# Patient Record
Sex: Female | Born: 1985 | Race: White | Hispanic: No | Marital: Married | Smoking: Current every day smoker
Health system: Southern US, Community
[De-identification: ages and names within clinical notes are randomized; demographics above are authoritative.]

## PROBLEM LIST (undated history)

## (undated) DIAGNOSIS — D649 Anemia, unspecified: Secondary | ICD-10-CM

## (undated) DIAGNOSIS — I10 Essential (primary) hypertension: Secondary | ICD-10-CM

## (undated) DIAGNOSIS — Z9289 Personal history of other medical treatment: Secondary | ICD-10-CM

## (undated) DIAGNOSIS — K219 Gastro-esophageal reflux disease without esophagitis: Secondary | ICD-10-CM

## (undated) DIAGNOSIS — Z789 Other specified health status: Secondary | ICD-10-CM

## (undated) HISTORY — PX: MANDIBLE FRACTURE SURGERY: SHX706

---
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
## 2008-02-04 ENCOUNTER — Emergency Department: Payer: Self-pay | Admitting: Emergency Medicine

## 2008-04-25 ENCOUNTER — Inpatient Hospital Stay: Payer: Self-pay

## 2008-06-08 ENCOUNTER — Encounter: Payer: Self-pay | Admitting: Maternal & Fetal Medicine

## 2011-05-29 ENCOUNTER — Emergency Department: Payer: Self-pay | Admitting: Internal Medicine

## 2012-08-24 ENCOUNTER — Encounter (HOSPITAL_COMMUNITY): Payer: Self-pay | Admitting: Emergency Medicine

## 2012-08-24 ENCOUNTER — Emergency Department (HOSPITAL_COMMUNITY)
Admission: EM | Admit: 2012-08-24 | Discharge: 2012-08-24 | Disposition: A | Discharge status: Home / Self Care | Payer: Self-pay | Attending: Emergency Medicine | Admitting: Emergency Medicine

## 2012-08-24 DIAGNOSIS — J040 Acute laryngitis: Secondary | ICD-10-CM | POA: Insufficient documentation

## 2012-08-24 DIAGNOSIS — H6690 Otitis media, unspecified, unspecified ear: Secondary | ICD-10-CM | POA: Insufficient documentation

## 2012-08-24 DIAGNOSIS — H6692 Otitis media, unspecified, left ear: Secondary | ICD-10-CM

## 2012-08-24 MED ORDER — HYDROCODONE-ACETAMINOPHEN 7.5-500 MG/15ML PO SOLN: 15.0000 mL | Freq: Four times a day (QID) | ORAL | Status: DC | PRN

## 2012-08-24 MED ORDER — PREDNISONE 10 MG PO TABS: 20.0000 mg | ORAL_TABLET | Freq: Every day | ORAL | Status: DC

## 2012-08-24 MED ORDER — AMOXICILLIN 500 MG PO CAPS: 500.0000 mg | ORAL_CAPSULE | Freq: Three times a day (TID) | ORAL | Status: DC

## 2012-08-24 NOTE — ED Notes (Signed)
Patient reports onset of symptoms 5 days ago.  Patient reports initial onset of cough, nausea, and vomiting that has progressed into ear pain, head pain, and congestion.  Patient denies any visual changes or any other neurological changes.  Patient endorses some dizziness when she has not eaten.  Patient reports decreases appetite and fatigue.  Patient denies any sick contacts. Patient denies any fevers at home.

## 2012-08-24 NOTE — ED Provider Notes (Signed)
History     CSN: [id number]  Arrival date & time 08/24/12  1632   First MD Initiated Contact with Patient 08/24/12 1706      Chief Complaint  Patient presents with  . URI    (Consider location/radiation/quality/duration/timing/severity/associated sxs/prior treatment) HPI  A generally healthy 26 year old female presents with URI symptoms. Patient reports for the past 5 days she has had headache, ear pain, sore throat, or sinus nonproductive cough, nausea, vomiting, body aches,and nasal congestion. Symptoms gradually onset, persistent, moderate in severity, not alleviate with DayQuil, NyQuil, and Excedrin. Complaining of decreasing hearing and left ear pain. Also endorsed decreased appetite. No recent sick contact. No fevers or chills, chest pain, shortness of breath, or abdominal pain. No rash.  History reviewed. No pertinent past medical history.  History reviewed. No pertinent past surgical history.  No family history on file.  History  Substance Use Topics  . Smoking status: Not on file  . Smokeless tobacco: Not on file  . Alcohol Use: Not on file    OB History    Grav Para Term Preterm Abortions TAB SAB Ect Mult Living                  Review of Systems  All other systems reviewed and are negative.    Allergies  Review of patient's allergies indicates no known allergies.  Home Medications   Current Outpatient Rx  Name Route Sig Dispense Refill  . ASPIRIN EC 325 MG PO TBEC Oral Take 650 mg by mouth every 6 (six) hours as needed. For pain    . EXCEDRIN PO Oral Take 2-3 tablets by mouth 2 (two) times daily as needed. For pain      BP 124/74  Pulse 97  Temp 98 F (36.7 C) (Oral)  Resp 20  SpO2 98%  LMP 08/17/2012  Physical Exam  Nursing note and vitals reviewed. Constitutional: She appears well-developed and well-nourished. No distress.       Awake, alert, nontoxic appearance  HENT:  Head: Atraumatic.  Right Ear: No drainage. Tympanic membrane is  not injected and not retracted. No middle ear effusion. No decreased hearing is noted.  Left Ear: No swelling or tenderness. No foreign bodies. Tympanic membrane is injected.  No middle ear effusion. Decreased hearing is noted.  Nose: Nose normal.  Mouth/Throat: Uvula is midline.       Bilateral tonsillar enlargement without evidence of deep tissues infection or  Exudates.  Voice is hoarse  Eyes: Conjunctivae normal are normal. Right eye exhibits no discharge. Left eye exhibits no discharge.  Neck: Neck supple.  Cardiovascular: Normal rate and regular rhythm.   Pulmonary/Chest: Effort normal. No respiratory distress. She exhibits no tenderness.  Abdominal: Soft. There is no tenderness. There is no rebound.  Musculoskeletal: She exhibits no tenderness.       ROM appears intact, no obvious focal weakness  Lymphadenopathy:    She has no cervical adenopathy.  Neurological: She is alert.       Mental status and motor strength appears intact  Skin: No rash noted.  Psychiatric: She has a normal mood and affect.    ED Course  Procedures (including critical care time)  Results for orders placed during the hospital encounter of 08/24/12  RAPID STREP SCREEN      Component Value Range   Streptococcus, Group A Screen (Direct) NEGATIVE  NEGATIVE   No results found.  1. Otitis media, Left 2. laryngitis   MDM  Patient with URI symptoms.  She does have evidence of hoarseness, and bilateral tonsils enlargement. No evidence of deep tissues infection. She is afebrile with stable normal vital signs. Strep test ordered.  6:17 PM Since pt has hoarseness, and L ear pain along with constellation of URI sxs, i will prescribe amox due to persistent sxs despite OTC meds.  I will also prescribe prednisone and lortab elixir for sore throat and laryngitis.  Otherwise pt stable to be d/c.    BP 124/74  Pulse 97  Temp 98 F (36.7 C) (Oral)  Resp 20  SpO2 98%  LMP 08/17/2012  I have reviewed nursing  notes and vital signs.  I reviewed available ER/hospitalization records thought the EMR      Fayrene Helper, [place] 08/24/12 1818

## 2012-08-24 NOTE — ED Provider Notes (Signed)
Medical screening examination/treatment/procedure(s) were performed by non-physician practitioner and as supervising physician I was immediately available for consultation/collaboration.    Gram Siedlecki L Stedman [place], MD 08/24/12 2312 

## 2012-08-29 ENCOUNTER — Encounter (HOSPITAL_COMMUNITY): Payer: Self-pay | Admitting: *Deleted

## 2012-08-29 ENCOUNTER — Emergency Department (HOSPITAL_COMMUNITY)
Admission: EM | Admit: 2012-08-29 | Discharge: 2012-08-29 | Disposition: A | Discharge status: Home / Self Care | Payer: Self-pay | Source: Home / Self Care | Attending: Emergency Medicine | Admitting: Emergency Medicine

## 2012-08-29 DIAGNOSIS — H6690 Otitis media, unspecified, unspecified ear: Secondary | ICD-10-CM

## 2012-08-29 NOTE — ED Provider Notes (Signed)
Chief Complaint  Patient presents with  . Otalgia    History of Present Illness:   Sandra Pearson is a 26 year old female who has had a one half week history of left ear pain and congestion, slight sore throat, hoarseness, nasal congestion and rhinorrhea. She denies any coughing or fever. She was seen a few days ago in the emergency room for these symptoms. She was diagnosed with laryngitis and given a prescription for steroids and an antibiotic, but told to get the antibiotic filled only if she hadn't felt better in a few days. She was told she has cerumen in the ear canal and to use peroxide and water which she has been doing. She comes in today because the ear is still bothering her with pain and congestion. She denies any drainage.  Review of Systems:  Other than noted above, the patient denies any of the following symptoms: Systemic:  No fevers, chills, sweats, weight loss or gain, fatigue, or tiredness. Eye:  No redness, pain, discharge, itching, blurred vision, or diplopia. ENT:  No headache, nasal congestion, sneezing, itching, epistaxis, ear pain, congestion, decreased hearing, ringing in ears, vertigo, or tinnitus.  No oral lesions, sore throat, pain on swallowing, or hoarseness. Neck:  No mass, tenderness or adenopathy. Lungs:  No coughing, wheezing, or shortness of breath. Skin:  No rash or itching.  PMFSH:  Past medical history, family history, social history, meds, and allergies were reviewed.  Physical Exam:   Vital signs:  BP 135/67  Pulse 76  Temp 98.2 F (36.8 C) (Oral)  Resp 18  SpO2 100%  LMP 08/17/2012 General:  Alert and oriented.  In no distress.  Skin warm and dry. Eye:  PERRL, full EOMs, lids and conjunctiva normal.   ENT:  The left ear canal is completely clear and there was no cerumen present at all, however the TM is red and bulging with loss of normal landmarks. The right ear canal shows a little bit of cerumen but the TM is normal.  Nasal mucosa not congested and  without drainage.  Mucous membranes moist, no oral lesions, normal dentition, pharynx is erythematous and swollen without exudate or ulcerations.  No cranial or facial pain to palplation. Neck:  Supple, full ROM.  No adenopathy, tenderness or mass.  Thyroid normal. Lungs:  Breath sounds clear and equal bilaterally.  No wheezes, rales or rhonchi. Heart:  Rhythm regular, without extrasystoles.  No gallops or murmers. Skin:  Clear, warm and dry.  Assessment:  The encounter diagnosis was Otitis media.  Plan:   1.  The following meds were prescribed:   New Prescriptions   No medications on file   2.  The patient was instructed in symptomatic care and handouts were given. She should go ahead and get the antibiotic prescription filled and use a decongestant along with politzerization and Afrin nasal spray. 3.  The patient was told to return if becoming worse in any way, if no better in 3 or 4 days, and given some red flag symptoms that would indicate earlier return. If this hasn't gotten better in 10 days, may need referral to an ear nose and throat specialist.     Reuben Likes, MD 08/29/12 1531

## 2012-08-29 NOTE — ED Notes (Signed)
Pt     Reports  Was  Seen  Several  Days  Ago  For  Sandra Pearson     In the  Er  And  Was  Told  l  Ear was  Clogged   She  Reportedly  Used  peroxidde  To  Clear  It  Up  But it  Did  Not  -

## 2013-09-18 ENCOUNTER — Emergency Department (HOSPITAL_COMMUNITY)
Admission: EM | Admit: 2013-09-18 | Discharge: 2013-09-18 | Disposition: A | Discharge status: Home / Self Care | Payer: Self-pay | Attending: Emergency Medicine | Admitting: Emergency Medicine

## 2013-09-18 ENCOUNTER — Encounter (HOSPITAL_COMMUNITY): Payer: Self-pay | Admitting: Emergency Medicine

## 2013-09-18 DIAGNOSIS — Z792 Long term (current) use of antibiotics: Secondary | ICD-10-CM | POA: Insufficient documentation

## 2013-09-18 DIAGNOSIS — K089 Disorder of teeth and supporting structures, unspecified: Secondary | ICD-10-CM | POA: Insufficient documentation

## 2013-09-18 DIAGNOSIS — R51 Headache: Secondary | ICD-10-CM | POA: Insufficient documentation

## 2013-09-18 DIAGNOSIS — K0889 Other specified disorders of teeth and supporting structures: Secondary | ICD-10-CM

## 2013-09-18 DIAGNOSIS — IMO0002 Reserved for concepts with insufficient information to code with codable children: Secondary | ICD-10-CM | POA: Insufficient documentation

## 2013-09-18 DIAGNOSIS — Z7982 Long term (current) use of aspirin: Secondary | ICD-10-CM | POA: Insufficient documentation

## 2013-09-18 DIAGNOSIS — Z9889 Other specified postprocedural states: Secondary | ICD-10-CM | POA: Insufficient documentation

## 2013-09-18 MED ORDER — PENICILLIN V POTASSIUM 500 MG PO TABS: 500.0000 mg | ORAL_TABLET | Freq: Three times a day (TID) | ORAL | Status: DC

## 2013-09-18 MED ORDER — HYDROCODONE-ACETAMINOPHEN 5-325 MG PO TABS: 2.0000 | ORAL_TABLET | Freq: Four times a day (QID) | ORAL | Status: DC | PRN

## 2013-09-18 MED ORDER — HYDROCODONE-ACETAMINOPHEN 5-325 MG PO TABS
2.0000 | ORAL_TABLET | Freq: Once | ORAL | Status: AC
  Administered 2013-09-18: 2 via ORAL
  Filled 2013-09-18: qty 2

## 2013-09-18 NOTE — ED Notes (Signed)
prsents with front posterior tooth pain began Thursday. Dental caries noted. Pain is worse at night.

## 2013-09-18 NOTE — ED Provider Notes (Signed)
CSN: [id number]     Arrival date & time 09/18/13  1748 History   First MD Initiated Contact with Patient 09/18/13 1756     Chief Complaint  Patient presents with  . Dental Pain   (Consider location/radiation/quality/duration/timing/severity/associated sxs/prior Treatment) HPI Comments: Patient presents emergency department with chief complaint of dental pain. She states the pain began Thursday. It is progressively worsened. She states that it also causes her to have a headache. She denies any fevers or chills. She states the pain is worse at night. She has not tried anything to alleviate her symptoms. She states that she has a Education officer, community, but has been unable to see him. Nothing makes his symptoms better or worse. She states she is trying to establish an appointment.  The history is provided by the patient. No language interpreter was used.    History reviewed. No pertinent past medical history. Past Surgical History  Procedure Laterality Date  . Mandible fracture surgery     History reviewed. No pertinent family history. History  Substance Use Topics  . Smoking status: Never Smoker   . Smokeless tobacco: Not on file  . Alcohol Use: No   OB History   Grav Para Term Preterm Abortions TAB SAB Ect Mult Living                 Review of Systems  All other systems reviewed and are negative.    Allergies  Review of patient's allergies indicates no known allergies.  Home Medications   Current Outpatient Rx  Name  Route  Sig  Dispense  Refill  . amoxicillin (AMOXIL) 500 MG capsule   Oral   Take 1 capsule (500 mg total) by mouth 3 (three) times daily.   21 capsule   0   . aspirin EC 325 MG tablet   Oral   Take 650 mg by mouth every 6 (six) hours as needed. For pain         . Aspirin-Acetaminophen-Caffeine (EXCEDRIN PO)   Oral   Take 2-3 tablets by mouth 2 (two) times daily as needed. For pain         . HYDROcodone-acetaminophen (LORTAB) 7.5-500 MG/15ML solution    Oral   Take 15 mLs by mouth every 6 (six) hours as needed for pain or cough.   120 mL   0   . HYDROcodone-acetaminophen (NORCO/VICODIN) 5-325 MG per tablet   Oral   Take 2 tablets by mouth every 6 (six) hours as needed.   13 tablet   0   . penicillin v potassium (VEETID) 500 MG tablet   Oral   Take 1 tablet (500 mg total) by mouth 3 (three) times daily.   30 tablet   0   . predniSONE (DELTASONE) 10 MG tablet   Oral   Take 2 tablets (20 mg total) by mouth daily.   15 tablet   0    BP 126/67  Pulse 81  Temp(Src) 97.7 F (36.5 C) (Oral)  Resp 20  Ht 5\' 8"  (1.727 m)  Wt 125 lb (56.7 kg)  BMI 19.01 kg/m2  SpO2 100% Physical Exam  Nursing note and vitals reviewed. Constitutional: She is oriented to person, place, and time. She appears well-developed and well-nourished.  HENT:  Head: Normocephalic and atraumatic.  Mouth/Throat:    Poor dentition throughout.  Affected tooth as diagrammed.  No signs of peritonsillar or tonsillar abscess.  No signs of gingival abscess. Oropharynx is clear and without exudates.  Uvula is midline.  Airway is intact. No signs of Ludwig's angina with palpation of oral and sublingual mucosa.   Eyes: Conjunctivae and EOM are normal.  Neck: Normal range of motion.  Cardiovascular: Normal rate.   Pulmonary/Chest: Effort normal.  Abdominal: She exhibits no distension.  Musculoskeletal: Normal range of motion.  Neurological: She is alert and oriented to person, place, and time.  Skin: Skin is dry.  Psychiatric: She has a normal mood and affect. Her behavior is normal. Judgment and thought content normal.    ED Course  Procedures (including critical care time) Labs Review Labs Reviewed - No data to display Imaging Review No results found.  EKG Interpretation   None       MDM   1. Pain, dental    Patient with toothache.  No gross abscess.  Exam unconcerning for Ludwig's angina or spread of infection.  Will treat with penicillin and  pain medicine.  Urged patient to follow-up with dentist.      Roxy Horseman, PA-C 09/18/13 1823

## 2013-09-21 NOTE — ED Provider Notes (Signed)
Medical screening examination/treatment/procedure(s) were performed by non-physician practitioner and as supervising physician I was immediately available for consultation/collaboration.  EKG Interpretation   None         Nahomy Limburg J Gurnoor Ursua, MD 09/21/13 0724 

## 2014-04-14 ENCOUNTER — Encounter (HOSPITAL_COMMUNITY): Payer: Self-pay | Admitting: Emergency Medicine

## 2014-04-14 ENCOUNTER — Emergency Department (HOSPITAL_COMMUNITY)
Admission: EM | Admit: 2014-04-14 | Discharge: 2014-04-14 | Disposition: A | Discharge status: Home / Self Care | Payer: Self-pay | Attending: Emergency Medicine | Admitting: Emergency Medicine

## 2014-04-14 DIAGNOSIS — S61209A Unspecified open wound of unspecified finger without damage to nail, initial encounter: Secondary | ICD-10-CM | POA: Insufficient documentation

## 2014-04-14 DIAGNOSIS — W268XXA Contact with other sharp object(s), not elsewhere classified, initial encounter: Secondary | ICD-10-CM | POA: Insufficient documentation

## 2014-04-14 DIAGNOSIS — F17200 Nicotine dependence, unspecified, uncomplicated: Secondary | ICD-10-CM | POA: Insufficient documentation

## 2014-04-14 DIAGNOSIS — Z23 Encounter for immunization: Secondary | ICD-10-CM | POA: Insufficient documentation

## 2014-04-14 DIAGNOSIS — Y929 Unspecified place or not applicable: Secondary | ICD-10-CM | POA: Insufficient documentation

## 2014-04-14 DIAGNOSIS — Z7982 Long term (current) use of aspirin: Secondary | ICD-10-CM | POA: Insufficient documentation

## 2014-04-14 DIAGNOSIS — IMO0002 Reserved for concepts with insufficient information to code with codable children: Secondary | ICD-10-CM | POA: Insufficient documentation

## 2014-04-14 DIAGNOSIS — Z862 Personal history of diseases of the blood and blood-forming organs and certain disorders involving the immune mechanism: Secondary | ICD-10-CM | POA: Insufficient documentation

## 2014-04-14 DIAGNOSIS — Y9389 Activity, other specified: Secondary | ICD-10-CM | POA: Insufficient documentation

## 2014-04-14 DIAGNOSIS — S61311A Laceration without foreign body of left index finger with damage to nail, initial encounter: Secondary | ICD-10-CM

## 2014-04-14 DIAGNOSIS — Z792 Long term (current) use of antibiotics: Secondary | ICD-10-CM | POA: Insufficient documentation

## 2014-04-14 HISTORY — DX: Anemia, unspecified: D64.9

## 2014-04-14 MED ORDER — TETANUS-DIPHTH-ACELL PERTUSSIS 5-2.5-18.5 LF-MCG/0.5 IM SUSP
0.5000 mL | Freq: Once | INTRAMUSCULAR | Status: AC
  Administered 2014-04-14: 0.5 mL via INTRAMUSCULAR
  Filled 2014-04-14: qty 0.5

## 2014-04-14 NOTE — ED Notes (Signed)
Family at bedside. 

## 2014-04-14 NOTE — Discharge Instructions (Signed)
Keep wound clean. Refer to attached documents for more information.

## 2014-04-14 NOTE — ED Provider Notes (Addendum)
CSN: [id number]     Arrival date & time 04/14/14  1807 History  This chart was scribed for Sandra BeckKaitlyn Szekalski, PA-C working with Laray AngerKathleen M McManus, DO by Evon Slackerrance Branch, ED Scribe. This patient was seen in room TR07C/TR07C and the patient's care was started at 6:43 PM.     Chief Complaint  Patient presents with  . Extremity Laceration   The history is provided by the patient. No language interpreter was used.  HPI Comments: Pollyann SamplesBrittany Fleming is a 28 y.o. female who presents to the Emergency Department complaining of left index finger laceration. States she was taking out the trash and something in the trash bag cut her. She states she didn't notice she was cut until a few minutes after.  She states her last tetanus shot was in 2007   Past Medical History  Diagnosis Date  . Anemia    Past Surgical History  Procedure Laterality Date  . Mandible fracture surgery     No family history on file. History  Substance Use Topics  . Smoking status: Current Every Day Smoker -- 1.00 packs/day    Types: Cigarettes  . Smokeless tobacco: Not on file  . Alcohol Use: No     Comment: rarely    OB History   Grav Para Term Preterm Abortions TAB SAB Ect Mult Living                 Review of Systems  Skin: Positive for wound.  All other systems reviewed and are negative.   Allergies  Review of patient's allergies indicates no known allergies.  Home Medications   Prior to Admission medications   Medication Sig Start Date End Date Taking? Authorizing Provider  amoxicillin (AMOXIL) 500 MG capsule Take 1 capsule (500 mg total) by mouth 3 (three) times daily. 08/24/12   Fayrene HelperBowie Tran, PA-C  aspirin EC 325 MG tablet Take 650 mg by mouth every 6 (six) hours as needed. For pain    Historical Provider, MD  Aspirin-Acetaminophen-Caffeine (EXCEDRIN PO) Take 2-3 tablets by mouth 2 (two) times daily as needed. For pain    Historical Provider, MD  HYDROcodone-acetaminophen (LORTAB) 7.5-500 MG/15ML solution  Take 15 mLs by mouth every 6 (six) hours as needed for pain or cough. 08/24/12   Fayrene HelperBowie Tran, PA-C  HYDROcodone-acetaminophen (NORCO/VICODIN) 5-325 MG per tablet Take 2 tablets by mouth every 6 (six) hours as needed. 09/18/13   Roxy Horsemanobert Browning, PA-C  penicillin v potassium (VEETID) 500 MG tablet Take 1 tablet (500 mg total) by mouth 3 (three) times daily. 09/18/13   Roxy Horsemanobert Browning, PA-C  predniSONE (DELTASONE) 10 MG tablet Take 2 tablets (20 mg total) by mouth daily. 08/24/12   Fayrene HelperBowie Tran, PA-C   Triage Vitals: BP 150/88  Pulse 98  Temp(Src) 97.9 F (36.6 C)  Wt 128 lb (58.06 kg)  SpO2 100%  Physical Exam  Nursing note and vitals reviewed. Constitutional: She is oriented to person, place, and time. She appears well-developed and well-nourished. No distress.  HENT:  Head: Normocephalic and atraumatic.  Eyes: Conjunctivae and EOM are normal.  Neck: Neck supple.  Cardiovascular: Normal rate.   Pulmonary/Chest: Effort normal. No respiratory distress.  Musculoskeletal: Normal range of motion.  Neurological: She is alert and oriented to person, place, and time.  Sensation intact of distal left index finger  Skin: Skin is warm and dry.  2.5 cm vertical laceration of index finger of left hand on volar aspect.   Psychiatric: She has a normal mood and affect.  Her behavior is normal.    ED Course  Procedures (including critical care time) DIAGNOSTIC STUDIES: Oxygen Saturation is 100% on RA, normal by my interpretation.    COORDINATION OF CARE: 6:48 PM-Discussed treatment plan which includes laceration repair with pt at bedside and pt agreed to plan.   LACERATION REPAIR PROCEDURE NOTE The patient's identification was confirmed and consent was obtained. This procedure was performed by Sandra BeckKaitlyn Szekalski, PA-C at 6:50 PM. Site: volar aspect of left index finger Anesthetic used (type and amt): none Suture type/size: dermabond Length:2.5 # of Sutures:  n/a Technique:n/a Complexity:simple Antibx ointment applied: none Tetanus UTD or ordered: tdap given Site anesthetized, irrigated with NS, explored without evidence of foreign body, wound well approximated, site covered with dry, sterile dressing.  Patient tolerated procedure well without complications. Instructions for care discussed verbally and patient provided with additional written instructions for homecare and f/u.   Labs Review Labs Reviewed - No data to display  Imaging Review No results found.   EKG Interpretation None      MDM   Final diagnoses:  Laceration of left index finger w/o foreign body with damage to nail, initial encounter    7:19 PM Patient's laceration cleaned and closed without difficulty. No neurovascular compromise. Patient will have tdap here. No further evaluation needed at this time.    I personally performed the services described in this documentation, which was scribed in my presence. The recorded information has been reviewed and is accurate.      Sandra BeckKaitlyn Szekalski, PA-C 04/14/14 [address]22  Sandra LaupahoehoeSzekalski, [place]-C 04/27/14 1112

## 2014-04-16 NOTE — ED Provider Notes (Signed)
Medical screening examination/treatment/procedure(s) were performed by non-physician practitioner and as supervising physician I was immediately available for consultation/collaboration.   EKG Interpretation None        Laray AngerKathleen M McManus, DO 04/16/14 1447

## 2014-04-27 NOTE — ED Provider Notes (Signed)
Medical screening examination/treatment/procedure(s) were performed by non-physician practitioner and as supervising physician I was immediately available for consultation/collaboration.   EKG Interpretation None        Laray AngerKathleen M McManus, DO 04/27/14 1601

## 2016-04-20 ENCOUNTER — Emergency Department (HOSPITAL_COMMUNITY)
Admission: EM | Admit: 2016-04-20 | Discharge: 2016-04-20 | Disposition: A | Discharge status: Home / Self Care | Payer: Self-pay | Attending: Emergency Medicine | Admitting: Emergency Medicine

## 2016-04-20 ENCOUNTER — Encounter (HOSPITAL_COMMUNITY): Payer: Self-pay

## 2016-04-20 DIAGNOSIS — Z7982 Long term (current) use of aspirin: Secondary | ICD-10-CM | POA: Insufficient documentation

## 2016-04-20 DIAGNOSIS — Z79899 Other long term (current) drug therapy: Secondary | ICD-10-CM | POA: Insufficient documentation

## 2016-04-20 DIAGNOSIS — F17210 Nicotine dependence, cigarettes, uncomplicated: Secondary | ICD-10-CM | POA: Insufficient documentation

## 2016-04-20 DIAGNOSIS — L089 Local infection of the skin and subcutaneous tissue, unspecified: Secondary | ICD-10-CM | POA: Insufficient documentation

## 2016-04-20 MED ORDER — SULFAMETHOXAZOLE-TRIMETHOPRIM 800-160 MG PO TABS
1.0000 | ORAL_TABLET | Freq: Once | ORAL | Status: AC
  Administered 2016-04-20: 1 via ORAL
  Filled 2016-04-20: qty 1

## 2016-04-20 MED ORDER — SULFAMETHOXAZOLE-TRIMETHOPRIM 800-160 MG PO TABS: 1.0000 | ORAL_TABLET | Freq: Two times a day (BID) | ORAL | Status: AC

## 2016-04-20 NOTE — ED Notes (Signed)
Pt states she started having breakout on her face and it progressed to feeling something crawling on her; Pt states she has worms coming out of her skin; Pt has red marks over face and swollen left eye; Pt states rash on face was worse when she woke up this morning; Pt denies pain; pt a&ox 4 on arrival.

## 2016-04-20 NOTE — ED Provider Notes (Signed)
CSN: [id number]     Arrival date & time 04/20/16  [id number] History   First MD Initiated Contact with Patient 04/20/16 [phone]     Chief Complaint  Patient presents with  . Insect Bite     (Consider location/radiation/quality/duration/timing/severity/associated sxs/prior Treatment) HPI Comments: Patient with a history of anemia on no regular medications presents with complaint of rash. She feels like bugs/worms are in her skin causing itching and small sores that are generalized but worse on her face which began scabbing, swelling under her left eye and turning red this morning. No fever. She denies drug use. She states she can see worms come from the small sores and then go back into the skin. She also reports she sees these worms under her upper eye lid of the left eye where the facial swelling is the worst. No visual changes, eye drainage.  The history is provided by the patient. No language interpreter was used.    Past Medical History  Diagnosis Date  . Anemia    Past Surgical History  Procedure Laterality Date  . Mandible fracture surgery     History reviewed. No pertinent family history. Social History  Substance Use Topics  . Smoking status: Current Every Day Smoker -- 1.00 packs/day    Types: Cigarettes  . Smokeless tobacco: None  . Alcohol Use: No     Comment: rarely    OB History    No data available     Review of Systems  Constitutional: Negative for fever and chills.  Musculoskeletal: Negative.  Negative for myalgias.  Skin:       See HPI.  Neurological: Negative.       Allergies  Review of patient's allergies indicates no known allergies.  Home Medications   Prior to Admission medications   Medication Sig Start Date End Date Taking? Authorizing Provider  aspirin-acetaminophen-caffeine (EXCEDRIN MIGRAINE) [phone] [id number] MG per tablet Take 3 tablets by mouth 4 (four) times daily as needed for migraine.    Historical Provider, MD  sulfamethoxazole-trimethoprim  (BACTRIM DS,SEPTRA DS) 800-160 MG tablet Take 1 tablet by mouth 2 (two) times daily. 04/20/16 04/27/16  Elpidio AnisShari Dani Wallner, PA-C   BP 123/73 mmHg  Pulse 91  Temp(Src) 98.1 F (36.7 C) (Oral)  Resp 18  SpO2 98%  LMP 03/31/2016 Physical Exam  Constitutional: She is oriented to person, place, and time. She appears well-developed and well-nourished.  Eyes:  Full, pain-free range of extraocular movement. No foreign body observed in conjunctiva or upper eye lid.   Neck: Normal range of motion.  Pulmonary/Chest: Effort normal.  Neurological: She is alert and oriented to person, place, and time.  Skin: Skin is warm and dry.  Multiple facial sores that are scabbed. No active drainage or bleeding. There is soft tissue swelling and erythema associated with the sores under her left eye. No periorbital involvement. There are small areas of excoriation to UE's bilaterally that are in various stages of healing.     ED Course  Procedures (including critical care time) Labs Review Labs Reviewed - No data to display  Imaging Review No results found. I have personally reviewed and evaluated these images and lab results as part of my medical decision-making.   EKG Interpretation None      MDM   Final diagnoses:  Skin infection    Patient presents with complaint of skin sores stating she has seen worms in her skin and can't get them out. She denies drug use. 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 Facial sores are fairly large with what appears to be secondary infection under left eye. Will provide septra DS for treatment.     Elpidio AnisShari Jaliana [place], PA-C 04/28/16 0002  Layla MawKristen N Ward, DO 04/28/16 [id number]

## 2016-04-20 NOTE — ED Notes (Signed)
NP at bedside.

## 2016-04-20 NOTE — Discharge Instructions (Signed)
Wound Infection °A wound infection happens when a type of germ (bacteria) starts growing in the wound. In some cases, this can cause the wound to break open. If cared for properly, the infected wound will heal from the inside to the outside. Wound infections need treatment. °CAUSES °An infection is caused by bacteria growing in the wound.  °SYMPTOMS  °· Increase in redness, swelling, or pain at the wound site. °· Increase in drainage at the wound site. °· Wound or bandage (dressing) starts to smell bad. °· Fever. °· Feeling tired or fatigued. °· Pus draining from the wound. °TREATMENT  °Your health care provider will prescribe antibiotic medicine. The wound infection should improve within 24 to 48 hours. Any redness around the wound should stop spreading and the wound should be less painful.  °HOME CARE INSTRUCTIONS  °· Only take over-the-counter or prescription medicines for pain, discomfort, or fever as directed by your health care provider. °· Take your antibiotics as directed. Finish them even if you start to feel better. °· Gently wash the area with mild soap and water 2 times a day, or as directed. Rinse off the soap. Pat the area dry with a clean towel. Do not rub the wound. This may cause bleeding. °· Follow your health care provider's instructions for how often you need to change the dressing. °· Apply ointment and a dressing to the wound as directed. °· If the dressing sticks, moisten it with soapy water and gently remove it. °· Change the bandage right away if it becomes wet, dirty, or develops a bad smell. °· Take showers. Do not take tub baths, swim, or do anything that may soak the wound until it is healed. °· Avoid exercises that make you sweat heavily. °· Use anti-itch medicine as directed by your health care provider. The wound may itch when it is healing. Do not pick or scratch at the wound. °· Follow up with your health care provider to get your wound rechecked as directed. °SEEK MEDICAL CARE  IF: °· You have an increase in swelling, pain, or redness around the wound. °· You have an increase in the amount of pus coming from the wound. °· There is a bad smell coming from the wound. °· More of the wound breaks open. °· You have a fever. °MAKE SURE YOU:  °· Understand these instructions. °· Will watch your condition. °· Will get help right away if you are not doing well or get worse. °  °This information is not intended to replace advice given to you by your health care provider. Make sure you discuss any questions you have with your health care provider. °  °Document Released: 07/12/2003 Document Revised: 10/18/2013 Document Reviewed: 04/02/2015 °Elsevier Interactive Patient Education ©2016 [employer] ° °

## 2016-08-28 ENCOUNTER — Emergency Department (HOSPITAL_BASED_OUTPATIENT_CLINIC_OR_DEPARTMENT_OTHER)
Admission: EM | Admit: 2016-08-28 | Discharge: 2016-08-28 | Discharge status: Against Medical Advice | Payer: Self-pay | Attending: Emergency Medicine | Admitting: Emergency Medicine

## 2016-08-28 ENCOUNTER — Encounter (HOSPITAL_BASED_OUTPATIENT_CLINIC_OR_DEPARTMENT_OTHER): Payer: Self-pay | Admitting: *Deleted

## 2016-08-28 DIAGNOSIS — O209 Hemorrhage in early pregnancy, unspecified: Secondary | ICD-10-CM | POA: Insufficient documentation

## 2016-08-28 DIAGNOSIS — Z3A01 Less than 8 weeks gestation of pregnancy: Secondary | ICD-10-CM | POA: Insufficient documentation

## 2016-08-28 DIAGNOSIS — Z7982 Long term (current) use of aspirin: Secondary | ICD-10-CM | POA: Insufficient documentation

## 2016-08-28 DIAGNOSIS — N939 Abnormal uterine and vaginal bleeding, unspecified: Secondary | ICD-10-CM

## 2016-08-28 DIAGNOSIS — F17210 Nicotine dependence, cigarettes, uncomplicated: Secondary | ICD-10-CM | POA: Insufficient documentation

## 2016-08-28 DIAGNOSIS — O99331 Smoking (tobacco) complicating pregnancy, first trimester: Secondary | ICD-10-CM | POA: Insufficient documentation

## 2016-08-28 LAB — CBC WITH DIFFERENTIAL/PLATELET
BASOS ABS: 0 10*3/uL (ref 0.0–0.1)
Basophils Relative: 0 %
EOS ABS: 0.1 10*3/uL (ref 0.0–0.7)
EOS PCT: 1 %
HCT: 35.4 % — ABNORMAL LOW (ref 36.0–46.0)
Hemoglobin: 11.7 g/dL — ABNORMAL LOW (ref 12.0–15.0)
Lymphocytes Relative: 26 %
Lymphs Abs: 1.8 10*3/uL (ref 0.7–4.0)
MCH: 27.7 pg (ref 26.0–34.0)
MCHC: 33.1 g/dL (ref 30.0–36.0)
MCV: 83.7 fL (ref 78.0–100.0)
Monocytes Absolute: 0.4 10*3/uL (ref 0.1–1.0)
Monocytes Relative: 5 %
NEUTROS PCT: 68 %
Neutro Abs: 4.5 10*3/uL (ref 1.7–7.7)
PLATELETS: 332 10*3/uL (ref 150–400)
RBC: 4.23 MIL/uL (ref 3.87–5.11)
RDW: 15.8 % — ABNORMAL HIGH (ref 11.5–15.5)
WBC: 6.7 10*3/uL (ref 4.0–10.5)

## 2016-08-28 LAB — URINALYSIS, ROUTINE W REFLEX MICROSCOPIC
BILIRUBIN URINE: NEGATIVE
Glucose, UA: NEGATIVE mg/dL
KETONES UR: NEGATIVE mg/dL
Nitrite: NEGATIVE
Protein, ur: NEGATIVE mg/dL
SPECIFIC GRAVITY, URINE: 1.016 (ref 1.005–1.030)
pH: 5 (ref 5.0–8.0)

## 2016-08-28 LAB — HCG, QUANTITATIVE, PREGNANCY: HCG, BETA CHAIN, QUANT, S: 1 m[IU]/mL (ref <5)

## 2016-08-28 LAB — URINE MICROSCOPIC-ADD ON

## 2016-08-28 LAB — PREGNANCY, URINE: Preg Test, Ur: NEGATIVE

## 2016-08-28 LAB — WET PREP, GENITAL
Clue Cells Wet Prep HPF POC: NONE SEEN
SPERM: NONE SEEN
Trich, Wet Prep: NONE SEEN
YEAST WET PREP: NONE SEEN

## 2016-08-28 LAB — ABO/RH: ABO/RH(D): A POS

## 2016-08-28 NOTE — ED Notes (Signed)
Dr. Ethelda ChickJacubowitz went in to discuss results with pt and pt was not in room, gown was on chair, and belongings were absent. Pt did not discuss leaving with staff, nor did staff see her depart. Pt is assumed to have left prior to discharge. No pending orders, no prescriptions.

## 2016-08-28 NOTE — ED Provider Notes (Signed)
[facility] Provider Note   CSN: [id number] Arrival date & time: 08/28/16  1802 By signing my name below, I, [name], attest that this documentation has been prepared under the direction and in the presence of Doug SouSam Miakoda Mcmillion, MD . Electronically Signed: [name], Scribe. 08/28/2016. 6:19 PM.   History   Chief Complaint Chief Complaint  Patient presents with  . Abdominal Pain  . Vaginal Bleeding  . [redacted] weeks pregnant    HPI DenmarkBrittany  is a 30 y.o. female with hx of anemia who presents to the Emergency Department complaining of gradual onset, intermittent suprapubic pain which began this morning at 2 am. She describes her pain as cramping Nonradiating rates it as a 4/10 in severity. Per pt, the pain lasts for 2 hours at a time with twenty minutes of relief. Pt notes associated vaginal bleeding. She has used 6 or 7 pads today. She is not followed by an obstetrician, but states that she knows that she conceived six weeks ago. This is her second pregnancy. She is unsure of her blood type or count. Pt denies any tobacco, drug or alcohol use. NKDA. Pt has no other complaints at this time. . Nothing makes symptoms better or worse. No other associated symptoms. No lightheadedness no fever no dysuria  The history is provided by the patient. No language interpreter was used.    Past Medical History:  Diagnosis Date  . Anemia     There are no active problems to display for this patient.   Past Surgical History:  Procedure Laterality Date  . MANDIBLE FRACTURE SURGERY      OB History    Gravida Para Term Preterm AB Living   1             SAB TAB Ectopic Multiple Live Births                 Gravida 2 para 1001 with one term delivery, which died shortly after birth  Home Medications    Prior to Admission medications   Medication Sig Start Date End Date Taking? Authorizing Provider  aspirin-acetaminophen-caffeine (EXCEDRIN MIGRAINE) [phone] MG per tablet  Take 3 tablets by mouth 4 (four) times daily as needed for migraine.    Historical Provider, MD    Family History No family history on file.  Social History Social History  Substance Use Topics  . Smoking status: Current Every Day Smoker    Packs/day: 1.00    Types: Cigarettes  . Smokeless tobacco: Never Used  . Alcohol use No     Comment: rarely      Allergies   Review of patient's allergies indicates no known allergies.   Review of Systems Review of Systems  Constitutional: Negative.   HENT: Negative.   Respiratory: Negative.   Cardiovascular: Negative.   Gastrointestinal: Positive for abdominal pain.       Suprapubic pain  Genitourinary: Positive for vaginal bleeding.       Pregnant  Musculoskeletal: Negative.   Skin: Negative.   Neurological: Negative.   Psychiatric/Behavioral: Negative.   All other systems reviewed and are negative.   Physical Exam Updated Vital Signs BP 120/90   Pulse 119 Comment: states she has been upset and crying over personal issue prior to arrival  Temp 98.5 F (36.9 C) (Oral)   Resp 20   Ht 5\' 9"  (1.753 m)   Wt 136 lb (61.7 kg)   LMP 07/09/2016   SpO2 96%   BMI 20.08 kg/m  Physical Exam  Constitutional: She appears well-developed and well-nourished. No distress.  HENT:  Head: Normocephalic and atraumatic.  Poor dentition  Eyes: Conjunctivae are normal. Pupils are equal, round, and reactive to light.  Neck: Neck supple. No tracheal deviation present. No thyromegaly present.  Cardiovascular: Normal rate and regular rhythm.   No murmur heard. Pulmonary/Chest: Effort normal and breath sounds normal.  Abdominal: Soft. Bowel sounds are normal. She exhibits no distension. There is no tenderness.  Genitourinary:  Genitourinary Comments: No external lesion. Slight amount of dark vaginal blood in vault. Cervical os closed. No cervical motion tenderness no adnexal or fundal masses or tenderness  Musculoskeletal: Normal range of  motion. She exhibits no edema or tenderness.  Neurological: She is alert. Coordination normal.  Skin: Skin is warm and dry. No rash noted.  Psychiatric: She has a normal mood and affect.  Nursing note and vitals reviewed.    ED Treatments / Results  DIAGNOSTIC STUDIES:  Oxygen Saturation is 96% on RA, adequate by my interpretation.    COORDINATION OF CARE:  6:17 PM Discussed treatment plan with pt at bedside and pt agreed to plan.  Labs (all labs ordered are listed, but only abnormal results are displayed) Labs Reviewed  PREGNANCY, URINE  URINALYSIS, ROUTINE W REFLEX MICROSCOPIC (NOT AT [facility])    EKG  EKG Interpretation None       Radiology No results found.  Procedures Procedures (including critical care time)  Medications Ordered in ED Medications - No data to display   Initial Impression / Assessment and Plan / ED Course  I have reviewed the triage vital signs and the nursing notes.  Pertinent labs & imaging results that were available during my care of the patient were reviewed by me and considered in my medical decision making (see chart for details).  Clinical Course    Patient eloped from the emergency department without notifying staff. Results for orders placed or performed during the hospital encounter of 08/28/16  Wet prep, genital  Result Value Ref Range   Yeast Wet Prep HPF POC NONE SEEN NONE SEEN   Trich, Wet Prep NONE SEEN NONE SEEN   Clue Cells Wet Prep HPF POC NONE SEEN NONE SEEN   WBC, Wet Prep HPF POC FEW (A) NONE SEEN   Sperm NONE SEEN   Pregnancy, urine  Result Value Ref Range   Preg Test, Ur NEGATIVE NEGATIVE  Urinalysis, Routine w reflex microscopic (not at [facility])  Result Value Ref Range   Color, Urine YELLOW YELLOW   APPearance CLEAR CLEAR   Specific Gravity, Urine 1.016 1.005 - 1.030   pH 5.0 5.0 - 8.0   Glucose, UA NEGATIVE NEGATIVE mg/dL   Hgb urine dipstick LARGE (A) NEGATIVE   Bilirubin Urine NEGATIVE NEGATIVE   Ketones,  ur NEGATIVE NEGATIVE mg/dL   Protein, ur NEGATIVE NEGATIVE mg/dL   Nitrite NEGATIVE NEGATIVE   Leukocytes, UA SMALL (A) NEGATIVE  hCG, quantitative, pregnancy  Result Value Ref Range   hCG, Beta Chain, Quant, S 1 <5 mIU/mL  CBC with Differential/Platelet  Result Value Ref Range   WBC 6.7 4.0 - 10.5 K/uL   RBC 4.23 3.87 - 5.11 MIL/uL   Hemoglobin 11.7 (L) 12.0 - 15.0 g/dL   HCT 16.135.4 (L) 09.636.0 - 04.546.0 %   MCV 83.7 78.0 - 100.0 fL   MCH 27.7 26.0 - 34.0 pg   MCHC 33.1 30.0 - 36.0 g/dL   RDW 40.915.8 (H) 81.111.5 - 91.415.5 %   Platelets 332  150 - 400 K/uL   Neutrophils Relative % 68 %   Neutro Abs 4.5 1.7 - 7.7 K/uL   Lymphocytes Relative 26 %   Lymphs Abs 1.8 0.7 - 4.0 K/uL   Monocytes Relative 5 %   Monocytes Absolute 0.4 0.1 - 1.0 K/uL   Eosinophils Relative 1 %   Eosinophils Absolute 0.1 0.0 - 0.7 K/uL   Basophils Relative 0 %   Basophils Absolute 0.0 0.0 - 0.1 K/uL  Urine microscopic-add on  Result Value Ref Range   Squamous Epithelial / LPF 0-5 (A) NONE SEEN   WBC, UA 0-5 0 - 5 WBC/hpf   RBC / HPF 6-30 0 - 5 RBC/hpf   Bacteria, UA FEW (A) NONE SEEN   Urine-Other MUCOUS PRESENT    No results found. No evidence of pregnancy Final Clinical Impressions(s) / ED Diagnoses  Diagnosis abnormal vaginal bleeding Final diagnoses:  None    New Prescriptions New Prescriptions   No medications on file       Doug Sou, MD 08/28/16 2332

## 2016-08-28 NOTE — ED Triage Notes (Signed)
[redacted] weeks pregnant. Vaginal bleeding and lower abdominal cramping since last night.

## 2016-08-29 LAB — GC/CHLAMYDIA PROBE AMP (~~LOC~~) NOT AT ARMC
Chlamydia: NEGATIVE
Neisseria Gonorrhea: NEGATIVE

## 2016-08-30 LAB — HIV ANTIBODY (ROUTINE TESTING W REFLEX): HIV SCREEN 4TH GENERATION: NONREACTIVE

## 2016-08-30 LAB — RPR: RPR: NONREACTIVE

## 2018-07-13 ENCOUNTER — Emergency Department: Payer: Self-pay

## 2018-07-13 ENCOUNTER — Other Ambulatory Visit: Payer: Self-pay

## 2018-07-13 ENCOUNTER — Emergency Department
Admission: EM | Admit: 2018-07-13 | Discharge: 2018-07-13 | Disposition: A | Discharge status: Home / Self Care | Payer: Self-pay | Attending: Emergency Medicine | Admitting: Emergency Medicine

## 2018-07-13 DIAGNOSIS — F17210 Nicotine dependence, cigarettes, uncomplicated: Secondary | ICD-10-CM | POA: Insufficient documentation

## 2018-07-13 DIAGNOSIS — K2900 Acute gastritis without bleeding: Secondary | ICD-10-CM | POA: Insufficient documentation

## 2018-07-13 DIAGNOSIS — N83201 Unspecified ovarian cyst, right side: Secondary | ICD-10-CM

## 2018-07-13 DIAGNOSIS — N83291 Other ovarian cyst, right side: Secondary | ICD-10-CM | POA: Insufficient documentation

## 2018-07-13 LAB — COMPREHENSIVE METABOLIC PANEL
ALBUMIN: 3.5 g/dL (ref 3.5–5.0)
ALT: 66 U/L — ABNORMAL HIGH (ref 0–44)
AST: 101 U/L — ABNORMAL HIGH (ref 15–41)
Alkaline Phosphatase: 93 U/L (ref 38–126)
Anion gap: 10 (ref 5–15)
BILIRUBIN TOTAL: 1.3 mg/dL — AB (ref 0.3–1.2)
BUN: 9 mg/dL (ref 6–20)
CO2: 23 mmol/L (ref 22–32)
Calcium: 8.9 mg/dL (ref 8.9–10.3)
Chloride: 105 mmol/L (ref 98–111)
Creatinine, Ser: 0.61 mg/dL (ref 0.44–1.00)
GFR calc Af Amer: >60 mL/min (ref >60)
GFR calc non Af Amer: >60 mL/min (ref >60)
GLUCOSE: 101 mg/dL — AB (ref 70–99)
POTASSIUM: 3.4 mmol/L — AB (ref 3.5–5.1)
SODIUM: 138 mmol/L (ref 135–145)
TOTAL PROTEIN: 6.7 g/dL (ref 6.5–8.1)

## 2018-07-13 LAB — URINALYSIS, COMPLETE (UACMP) WITH MICROSCOPIC
Bacteria, UA: NONE SEEN
Bilirubin Urine: NEGATIVE
GLUCOSE, UA: NEGATIVE mg/dL
Ketones, ur: NEGATIVE mg/dL
LEUKOCYTES UA: NEGATIVE
NITRITE: NEGATIVE
PH: 5 (ref 5.0–8.0)
Protein, ur: NEGATIVE mg/dL
SPECIFIC GRAVITY, URINE: 1.006 (ref 1.005–1.030)

## 2018-07-13 LAB — CBC
HEMATOCRIT: 36.4 % (ref 35.0–47.0)
HEMOGLOBIN: 11.8 g/dL — AB (ref 12.0–16.0)
MCH: 30.6 pg (ref 26.0–34.0)
MCHC: 32.4 g/dL (ref 32.0–36.0)
MCV: 94.5 fL (ref 80.0–100.0)
Platelets: 151 10*3/uL (ref 150–440)
RBC: 3.85 MIL/uL (ref 3.80–5.20)
RDW: 21.9 % — ABNORMAL HIGH (ref 11.5–14.5)
WBC: 5.2 10*3/uL (ref 3.6–11.0)

## 2018-07-13 LAB — POCT PREGNANCY, URINE: Preg Test, Ur: NEGATIVE

## 2018-07-13 LAB — LIPASE, BLOOD: LIPASE: 57 U/L — AB (ref 11–51)

## 2018-07-13 LAB — TSH: TSH: 1.583 u[IU]/mL (ref 0.350–4.500)

## 2018-07-13 LAB — T4, FREE: Free T4: 0.84 ng/dL (ref 0.82–1.77)

## 2018-07-13 MED ORDER — METOCLOPRAMIDE HCL 10 MG PO TABS: 10.0000 mg | ORAL_TABLET | Freq: Four times a day (QID) | ORAL | 0 refills | Status: DC | PRN

## 2018-07-13 MED ORDER — FAMOTIDINE IN NACL 20-0.9 MG/50ML-% IV SOLN
20.0000 mg | Freq: Once | INTRAVENOUS | Status: AC
  Administered 2018-07-13: 20 mg via INTRAVENOUS

## 2018-07-13 MED ORDER — FAMOTIDINE IN NACL 20-0.9 MG/50ML-% IV SOLN
INTRAVENOUS | Status: AC
  Filled 2018-07-13: qty 50

## 2018-07-13 MED ORDER — IOPAMIDOL (ISOVUE-300) INJECTION 61%
100.0000 mL | Freq: Once | INTRAVENOUS | Status: AC | PRN
  Administered 2018-07-13: 100 mL via INTRAVENOUS
  Filled 2018-07-13: qty 100

## 2018-07-13 MED ORDER — ALUMINUM-MAGNESIUM-SIMETHICONE 200-200-20 MG/5ML PO SUSP: 30.0000 mL | Freq: Three times a day (TID) | ORAL | 0 refills | Status: DC

## 2018-07-13 MED ORDER — FAMOTIDINE 20 MG PO TABS: 20.0000 mg | ORAL_TABLET | Freq: Two times a day (BID) | ORAL | 0 refills | Status: DC

## 2018-07-13 MED ORDER — SODIUM CHLORIDE 0.9 % IV BOLUS
1000.0000 mL | Freq: Once | INTRAVENOUS | Status: AC
  Administered 2018-07-13: 1000 mL via INTRAVENOUS

## 2018-07-13 MED ORDER — METOCLOPRAMIDE HCL 5 MG/ML IJ SOLN
10.0000 mg | Freq: Once | INTRAMUSCULAR | Status: AC
  Administered 2018-07-13: 10 mg via INTRAVENOUS
  Filled 2018-07-13: qty 2

## 2018-07-13 NOTE — ED Triage Notes (Addendum)
Pt reports loss of appetite and emesis with eating x 2 weeks. Tremors. Pt hx of anemia, reports irregular period for past month  Pt drank alcohol daily until approx 1 week ago.   Pt alert and oriented X4, active, cooperative, pt in NAD. RR even and unlabored, color WNL.

## 2018-07-13 NOTE — ED Provider Notes (Signed)
[facility] Emergency Department Provider Note  ____________________________________________  Time seen: Approximately 5:33 PM  I have reviewed the triage vital signs and the nursing notes.   HISTORY  Chief Complaint Emesis and Anorexia    HPI Sandra Pearson is a 32 y.o. female with no significant past medical history who complains of decreased appetite and nausea for the past 2 weeks.  She reports that some days she is only able to eat a few crackers.  No vomiting but sometimes she has dry heaves.  No constipation or diarrhea.  No fevers or chills.  Upper abdominal pain is nonradiating, worse with oral intake, no alleviating factors, crampy.  Denies past abdominal surgeries.  No trauma.      Past Medical History:  Diagnosis Date  . Anemia      There are no active problems to display for this patient.    Past Surgical History:  Procedure Laterality Date  . MANDIBLE FRACTURE SURGERY       Prior to Admission medications   Medication Sig Start Date End Date Taking? Authorizing Provider  aluminum-magnesium hydroxide-simethicone (MAALOX) 200-200-20 MG/5ML SUSP Take 30 mLs by mouth 4 (four) times daily -  before meals and at bedtime. 07/13/18   Sharman CheekStafford, Raeanne Deschler, MD  aspirin-acetaminophen-caffeine (EXCEDRIN MIGRAINE) [phone] MG per tablet Take 3 tablets by mouth 4 (four) times daily as needed for migraine.    [provider]  famotidine (PEPCID) 20 MG tablet Take 1 tablet (20 mg total) by mouth 2 (two) times daily. 07/13/18   Sharman CheekStafford, Alane Hanssen, MD  metoCLOPramide (REGLAN) 10 MG tablet Take 1 tablet (10 mg total) by mouth every 6 (six) hours as needed. 07/13/18   Sharman CheekStafford, Coraleigh Sheeran, MD     Allergies Patient has no known allergies.   No family history on file.  Social History Social History   Tobacco Use  . Smoking status: Current Every Day Smoker    Packs/day: 1.00    Types: Cigarettes  . Smokeless tobacco: Never Used  Substance Use  Topics  . Alcohol use: No    Comment: rarely   . Drug use: No    Review of Systems  Constitutional:   No fever or chills.  ENT:   No sore throat. No rhinorrhea. Cardiovascular:   No chest pain or syncope. Respiratory:   No dyspnea or cough. Gastrointestinal: Positive as above for abdominal pain without vomiting or constipation Musculoskeletal:   Negative for focal pain or swelling All other systems reviewed and are negative except as documented above in ROS and HPI.  ____________________________________________   PHYSICAL EXAM:  VITAL SIGNS: ED Triage Vitals  Enc Vitals Group     BP 07/13/18 1153 (!) 133/92     Pulse Rate 07/13/18 1153 86     Resp 07/13/18 1153 20     Temp 07/13/18 1153 98.4 F (36.9 C)     Temp Source 07/13/18 1153 Oral     SpO2 07/13/18 1153 99 %     Weight 07/13/18 1154 135 lb (61.2 kg)     Height 07/13/18 1154 5\' 9"  (1.753 m)     Head Circumference --      Peak Flow --      Pain Score 07/13/18 1154 4     Pain Loc --      Pain Edu? --      Excl. in GC? --     Vital signs reviewed, nursing assessments reviewed.   Constitutional:   Alert and oriented. Non-toxic appearance. Eyes:  Conjunctivae are normal. EOMI. PERRL. ENT      Head:   Normocephalic and atraumatic.      Nose:   No congestion/rhinnorhea.       Mouth/Throat:   MMM, no pharyngeal erythema. No peritonsillar mass.       Neck:   No meningismus. Full ROM. Hematological/Lymphatic/Immunilogical:   No cervical lymphadenopathy. Cardiovascular:   RRR. Symmetric bilateral radial and DP pulses.  No murmurs. Cap refill less than 2 seconds. Respiratory:   Normal respiratory effort without tachypnea/retractions. Breath sounds are clear and equal bilaterally. No wheezes/rales/rhonchi. Gastrointestinal:   Soft with epigastric and left upper quadrant tenderness . Non distended. There is no CVA tenderness.  No rebound, rigidity, or guarding. Musculoskeletal:   Normal range of motion in all  extremities. No joint effusions.  No lower extremity tenderness.  No edema. Neurologic:   Normal speech and language.  Motor grossly intact. No acute focal neurologic deficits are appreciated.  Skin:    Skin is warm, dry and intact. No rash noted.  No petechiae, purpura, or bullae.  ____________________________________________    LABS (pertinent positives/negatives) (all labs ordered are listed, but only abnormal results are displayed) Labs Reviewed  LIPASE, BLOOD - Abnormal; Notable for the following components:      Result Value   Lipase 57 (*)    All other components within normal limits  COMPREHENSIVE METABOLIC PANEL - Abnormal; Notable for the following components:   Potassium 3.4 (*)    Glucose, Bld 101 (*)    AST 101 (*)    ALT 66 (*)    Total Bilirubin 1.3 (*)    All other components within normal limits  CBC - Abnormal; Notable for the following components:   Hemoglobin 11.8 (*)    RDW 21.9 (*)    All other components within normal limits  URINALYSIS, COMPLETE (UACMP) WITH MICROSCOPIC - Abnormal; Notable for the following components:   Color, Urine YELLOW (*)    APPearance CLEAR (*)    Hgb urine dipstick MODERATE (*)    All other components within normal limits  TSH  T4, FREE  POC URINE PREG, ED  POCT PREGNANCY, URINE   ____________________________________________   EKG    ____________________________________________    RADIOLOGY  Ct Abdomen Pelvis W Contrast  Result Date: 07/13/2018 CLINICAL DATA:  Generalized abdominal pain and distention. EXAM: CT ABDOMEN AND PELVIS WITH CONTRAST TECHNIQUE: Multidetector CT imaging of the abdomen and pelvis was performed using the standard protocol following bolus administration of intravenous contrast. CONTRAST:  ISOVUE-300 IOPAMIDOL (ISOVUE-300) INJECTION 61% COMPARISON:  None. FINDINGS: Lower chest: No acute abnormality. Hepatobiliary: Fatty infiltration of the liver is noted. No gallstones are noted. No  biliary dilatation is noted. Pancreas: Unremarkable. No pancreatic ductal dilatation or surrounding inflammatory changes. Spleen: Normal in size without focal abnormality. Adrenals/Urinary Tract: Adrenal glands are unremarkable. Kidneys are normal, without renal calculi, focal lesion, or hydronephrosis. Bladder is unremarkable. Stomach/Bowel: Stomach is within normal limits. Appendix appears normal. No evidence of bowel wall thickening, distention, or inflammatory changes. Vascular/Lymphatic: No significant vascular findings are present. No enlarged abdominal or pelvic lymph nodes. Reproductive: Uterus is unremarkable. 4.7 cm complex right adnexal low density is noted. Pelvic ultrasound is recommended for further evaluation. Other: No abdominal wall hernia or abnormality. No abdominopelvic ascites. Musculoskeletal: No acute or significant osseous findings. IMPRESSION: Fatty infiltration of the liver. 4.7 cm complex right ovarian low density is noted. Pelvic ultrasound is recommended for further evaluation. Electronically Signed   By: Fayrene Fearing  Christen Butter, M.D.   On: 07/13/2018 15:54    ____________________________________________   PROCEDURES Procedures  ____________________________________________  DIFFERENTIAL DIAGNOSIS   Pancreatitis, gastritis, choledocholithiasis, colitis, peptic ulcer disease, tumor  CLINICAL IMPRESSION / ASSESSMENT AND PLAN / ED COURSE  Pertinent labs & imaging results that were available during my care of the patient were reviewed by me and considered in my medical decision making (see chart for details).    Patient presents with upper abdominal pain and difficulty eating for the past 2 weeks.  No pronounced focal symptoms, presentation is clinically vague.  Check labs including thyroid studies, CT scan of the abdomen and pelvis.  IV fluids for hydration and antacids.  Clinical Course as of Jul 13 1732  Tue Jul 13, 2018  1554 Labs show mild elevation of LFTs. Otherwise  normal. Thyroid studies normal.   [PS]  1729 Patient feels better.  Feels hungry and wants to eat.  CT abdomen unremarkable except for ovarian cyst which can be followed up outpatient.   [PS]    Clinical Course User Index [PS] Sharman Cheek, MD     ____________________________________________   FINAL CLINICAL IMPRESSION(S) / ED DIAGNOSES    Final diagnoses:  Acute gastritis without hemorrhage, unspecified gastritis type  Right ovarian cyst     ED Discharge Orders         Ordered    aluminum-magnesium hydroxide-simethicone (MAALOX) 200-200-20 MG/5ML SUSP  3 times daily before meals & bedtime,   Status:  Discontinued     07/13/18 1731    famotidine (PEPCID) 20 MG tablet  2 times daily,   Status:  Discontinued     07/13/18 1731    metoCLOPramide (REGLAN) 10 MG tablet  Every 6 hours PRN,   Status:  Discontinued     07/13/18 1731    aluminum-magnesium hydroxide-simethicone (MAALOX) 200-200-20 MG/5ML SUSP  3 times daily before meals & bedtime     07/13/18 1733    famotidine (PEPCID) 20 MG tablet  2 times daily     07/13/18 1733    metoCLOPramide (REGLAN) 10 MG tablet  Every 6 hours PRN     07/13/18 1733          Portions of this note were generated with dragon dictation software. Dictation errors may occur despite best attempts at proofreading.    Sharman Cheek, MD 07/13/18 [phone]

## 2018-07-13 NOTE — ED Notes (Signed)
Patient with multiple problems today.  Says she is here for inability to eat for at least 2 weeks.  Says she has been in bed a lot.  Says she has pain across mid abd.  abd is firm to palpation.  No tender.   She also says she has irregular periods for about a year. And she says she is worried about an abscess in left lower tooth as it feels swollen and uncomfortable since yesterday.  Patient is alert and in no distress, but she is tremulous all over.

## 2018-07-13 NOTE — Discharge Instructions (Signed)
Your labs and CT scan today did not show any serious problems with your abdomen to explain your symptoms.  This is likely due to stomach inflammation.  The CT scan did show a 4 cm cyst on your right ovary.  You should follow-up with gynecology for further evaluation of this finding.

## 2018-10-03 ENCOUNTER — Emergency Department
Admission: EM | Admit: 2018-10-03 | Discharge: 2018-10-03 | Disposition: A | Discharge status: Home / Self Care | Payer: Self-pay | Attending: Emergency Medicine | Admitting: Emergency Medicine

## 2018-10-03 ENCOUNTER — Other Ambulatory Visit: Payer: Self-pay

## 2018-10-03 ENCOUNTER — Emergency Department: Payer: Self-pay

## 2018-10-03 DIAGNOSIS — R112 Nausea with vomiting, unspecified: Secondary | ICD-10-CM | POA: Insufficient documentation

## 2018-10-03 DIAGNOSIS — K7010 Alcoholic hepatitis without ascites: Secondary | ICD-10-CM | POA: Insufficient documentation

## 2018-10-03 DIAGNOSIS — E872 Acidosis: Secondary | ICD-10-CM | POA: Insufficient documentation

## 2018-10-03 DIAGNOSIS — F1020 Alcohol dependence, uncomplicated: Secondary | ICD-10-CM | POA: Insufficient documentation

## 2018-10-03 DIAGNOSIS — E8729 Other acidosis: Secondary | ICD-10-CM

## 2018-10-03 DIAGNOSIS — K209 Esophagitis, unspecified without bleeding: Secondary | ICD-10-CM

## 2018-10-03 DIAGNOSIS — F17210 Nicotine dependence, cigarettes, uncomplicated: Secondary | ICD-10-CM | POA: Insufficient documentation

## 2018-10-03 DIAGNOSIS — I10 Essential (primary) hypertension: Secondary | ICD-10-CM | POA: Insufficient documentation

## 2018-10-03 DIAGNOSIS — R079 Chest pain, unspecified: Secondary | ICD-10-CM | POA: Insufficient documentation

## 2018-10-03 HISTORY — DX: Essential (primary) hypertension: I10

## 2018-10-03 LAB — URINALYSIS, COMPLETE (UACMP) WITH MICROSCOPIC
Glucose, UA: 50 mg/dL — AB
Hgb urine dipstick: NEGATIVE
Ketones, ur: 5 mg/dL — AB
LEUKOCYTES UA: NEGATIVE
NITRITE: NEGATIVE
PH: 5 (ref 5.0–8.0)
Protein, ur: 100 mg/dL — AB
Specific Gravity, Urine: 1.032 — ABNORMAL HIGH (ref 1.005–1.030)

## 2018-10-03 LAB — COMPREHENSIVE METABOLIC PANEL
ALT: 170 U/L — ABNORMAL HIGH (ref 0–44)
ALT: 123 U/L — AB (ref 0–44)
ANION GAP: 22 — AB (ref 5–15)
AST: 206 U/L — ABNORMAL HIGH (ref 15–41)
AST: 136 U/L — ABNORMAL HIGH (ref 15–41)
Albumin: 4.3 g/dL (ref 3.5–5.0)
Albumin: 3.2 g/dL — ABNORMAL LOW (ref 3.5–5.0)
Alkaline Phosphatase: 159 U/L — ABNORMAL HIGH (ref 38–126)
Alkaline Phosphatase: 114 U/L (ref 38–126)
Anion gap: 11 (ref 5–15)
BUN: 8 mg/dL (ref 6–20)
BUN: 7 mg/dL (ref 6–20)
CALCIUM: 8.1 mg/dL — AB (ref 8.9–10.3)
CHLORIDE: 98 mmol/L (ref 98–111)
CHLORIDE: 100 mmol/L (ref 98–111)
CO2: 21 mmol/L — ABNORMAL LOW (ref 22–32)
CO2: 24 mmol/L (ref 22–32)
CREATININE: 0.82 mg/dL (ref 0.44–1.00)
Calcium: 9.8 mg/dL (ref 8.9–10.3)
Creatinine, Ser: 0.87 mg/dL (ref 0.44–1.00)
Glucose, Bld: 109 mg/dL — ABNORMAL HIGH (ref 70–99)
Glucose, Bld: 275 mg/dL — ABNORMAL HIGH (ref 70–99)
POTASSIUM: 4.2 mmol/L (ref 3.5–5.1)
Potassium: 4 mmol/L (ref 3.5–5.1)
SODIUM: 135 mmol/L (ref 135–145)
Sodium: 141 mmol/L (ref 135–145)
TOTAL PROTEIN: 8 g/dL (ref 6.5–8.1)
Total Bilirubin: 3.3 mg/dL — ABNORMAL HIGH (ref 0.3–1.2)
Total Bilirubin: 2.8 mg/dL — ABNORMAL HIGH (ref 0.3–1.2)
Total Protein: 6 g/dL — ABNORMAL LOW (ref 6.5–8.1)

## 2018-10-03 LAB — LIPASE, BLOOD: Lipase: 29 U/L (ref 11–51)

## 2018-10-03 LAB — CBC
HCT: 43.2 % (ref 36.0–46.0)
HEMOGLOBIN: 13.6 g/dL (ref 12.0–15.0)
MCH: 29.8 pg (ref 26.0–34.0)
MCHC: 31.5 g/dL (ref 30.0–36.0)
MCV: 94.7 fL (ref 80.0–100.0)
NRBC: 0 % (ref 0.0–0.2)
Platelets: 285 10*3/uL (ref 150–400)
RBC: 4.56 MIL/uL (ref 3.87–5.11)
RDW: 22.6 % — AB (ref 11.5–15.5)
WBC: 7.6 10*3/uL (ref 4.0–10.5)

## 2018-10-03 LAB — TROPONIN I

## 2018-10-03 LAB — POCT PREGNANCY, URINE: Preg Test, Ur: NEGATIVE

## 2018-10-03 MED ORDER — ONDANSETRON HCL 4 MG/2ML IJ SOLN
4.0000 mg | Freq: Once | INTRAMUSCULAR | Status: AC
  Administered 2018-10-03: 4 mg via INTRAVENOUS
  Filled 2018-10-03: qty 2

## 2018-10-03 MED ORDER — FAMOTIDINE 20 MG PO TABS: 20.0000 mg | ORAL_TABLET | Freq: Two times a day (BID) | ORAL | 0 refills | Status: DC

## 2018-10-03 MED ORDER — DEXTROSE 5 % AND 0.45 % NACL IV BOLUS
1000.0000 mL | Freq: Once | INTRAVENOUS | Status: AC
  Administered 2018-10-03: 1000 mL via INTRAVENOUS
  Filled 2018-10-03: qty 1000

## 2018-10-03 MED ORDER — SODIUM CHLORIDE 0.9 % IV BOLUS
1000.0000 mL | Freq: Once | INTRAVENOUS | Status: AC
  Administered 2018-10-03: 1000 mL via INTRAVENOUS

## 2018-10-03 MED ORDER — ONDANSETRON HCL 4 MG/2ML IJ SOLN
4.0000 mg | Freq: Once | INTRAMUSCULAR | Status: AC | PRN
  Administered 2018-10-03: 4 mg via INTRAVENOUS
  Filled 2018-10-03: qty 2

## 2018-10-03 MED ORDER — METOCLOPRAMIDE HCL 10 MG PO TABS: 10.0000 mg | ORAL_TABLET | Freq: Four times a day (QID) | ORAL | 0 refills | Status: DC | PRN

## 2018-10-03 MED ORDER — ALUM & MAG HYDROXIDE-SIMETH 200-200-20 MG/5ML PO SUSP
30.0000 mL | Freq: Once | ORAL | Status: AC
  Administered 2018-10-03: 30 mL via ORAL
  Filled 2018-10-03: qty 30

## 2018-10-03 NOTE — ED Notes (Signed)
Patient transported to Ultrasound 

## 2018-10-03 NOTE — ED Provider Notes (Signed)
[facility] Emergency Department Provider Note  ____________________________________________   First MD Initiated Contact with Patient 10/03/18 1538     (approximate)  I have reviewed the triage vital signs and the nursing notes.   HISTORY  Chief Complaint Emesis and Chest Pain   HPI Sandra Pearson is a 32 y.o. female with a history of anemia, hypertension and alcoholism was presented to the emergency department with intractable nausea and vomiting over the past 3 days.  The patient says the episode began 3 days ago when she was driving and felt sharp chest pain to the left side of her chest.  She says that she then had numbness and tingling to her bilateral upper extremities but that resolved.  However, she reports continued nausea and vomiting, approximately every 2 hours since then.  She denies any diarrhea.  Says that initial sharp chest pain has dissipated but now she has a burning type of pain to the center of her chest.  Does not report any radiation.  Says that up until 2 days ago she had been drinking approximately 3 vodka-containing drinks per day.  Denies any drug use.  Says that she has had similar episodes in the past and received medication for gastritis and reflux which helped her symptoms. Denies any abdominal pain.  Denies blood in the vomitus.    Past Medical History:  Diagnosis Date  . Anemia   . Hypertension     There are no active problems to display for this patient.   Past Surgical History:  Procedure Laterality Date  . MANDIBLE FRACTURE SURGERY      Prior to Admission medications   Not on File    Allergies Patient has no known allergies.  History reviewed. No pertinent family history.  Social History Social History   Tobacco Use  . Smoking status: Current Every Day Smoker    Packs/day: 1.00    Types: Cigarettes  . Smokeless tobacco: Never Used  Substance Use Topics  . Alcohol use: No    Comment: rarely   . Drug  use: No    Review of Systems  Constitutional: No fever/chills Eyes: No visual changes. ENT: No sore throat. Cardiovascular: as above Respiratory: Denies shortness of breath. Gastrointestinal: No abdominal pain.    No diarrhea.  No constipation. Genitourinary: Negative for dysuria. Musculoskeletal: Negative for back pain. Skin: Negative for rash. Neurological: Negative for headaches, focal weakness or numbness.   ____________________________________________   PHYSICAL EXAM:  VITAL SIGNS: ED Triage Vitals  Enc Vitals Group     BP 10/03/18 1313 130/74     Pulse Rate 10/03/18 1313 (!) 151     Resp 10/03/18 1313 20     Temp 10/03/18 1313 98 F (36.7 C)     Temp Source 10/03/18 1313 Oral     SpO2 10/03/18 1313 99 %     Weight 10/03/18 1314 136 lb (61.7 kg)     Height 10/03/18 1314 5\' 9"  (1.753 m)     Head Circumference --      Peak Flow --      Pain Score 10/03/18 1313 6     Pain Loc --      Pain Edu? --      Excl. in GC? --     Constitutional: Alert and oriented. Well appearing and in no acute distress. Eyes: Conjunctivae are pale.  Head: Atraumatic. Nose: No congestion/rhinnorhea. Mouth/Throat: Mucous membranes are moist.  No erythema to the posterior pharynx.  No exudate.  Neck: No stridor.   Cardiovascular: Tachycardic, regular rhythm. Grossly normal heart sounds.   Respiratory: Normal respiratory effort.  No retractions. Lungs CTAB. Gastrointestinal: Soft and nontender. No distention.  Musculoskeletal: No lower extremity tenderness nor edema.  No joint effusions. Neurologic:  Normal speech and language. No gross focal neurologic deficits are appreciated. Skin:  Skin is warm, dry and intact. No rash noted. Psychiatric: Mood and affect are normal. Speech and behavior are normal.  ____________________________________________   LABS (all labs ordered are listed, but only abnormal results are displayed)  Labs Reviewed  COMPREHENSIVE METABOLIC PANEL - Abnormal;  Notable for the following components:      Result Value   CO2 21 (*)    Glucose, Bld 109 (*)    AST 206 (*)    ALT 170 (*)    Alkaline Phosphatase 159 (*)    Total Bilirubin 3.3 (*)    Anion gap 22 (*)    All other components within normal limits  CBC - Abnormal; Notable for the following components:   RDW 22.6 (*)    All other components within normal limits  URINALYSIS, COMPLETE (UACMP) WITH MICROSCOPIC - Abnormal; Notable for the following components:   Color, Urine AMBER (*)    APPearance CLEAR (*)    Specific Gravity, Urine 1.032 (*)    Glucose, UA 50 (*)    Bilirubin Urine SMALL (*)    Ketones, ur 5 (*)    Protein, ur 100 (*)    Bacteria, UA RARE (*)    All other components within normal limits  COMPREHENSIVE METABOLIC PANEL - Abnormal; Notable for the following components:   Glucose, Bld 275 (*)    Calcium 8.1 (*)    Total Protein 6.0 (*)    Albumin 3.2 (*)    AST 136 (*)    ALT 123 (*)    Total Bilirubin 2.8 (*)    All other components within normal limits  LIPASE, BLOOD  TROPONIN I  POCT PREGNANCY, URINE  POC URINE PREG, ED   ____________________________________________  EKG  ED ECG REPORT I, Arelia Longest, the attending physician, personally viewed and interpreted this ECG.   Date: 10/03/2018  EKG Time: 1316  Rate: 149  Rhythm: sinus tachycardia  Axis: normal  Intervals:none  ST&T Change: No ST segment elevation or depression.  No abnormal T wave.  ____________________________________________  RADIOLOGY  Right upper quadrant ultrasound suspected of hepatic steatosis.  4 mm gallbladder polyp versus adherent gallstone.  However, follow-up not required given small size of finding. ____________________________________________   PROCEDURES  Procedure(s) performed:   Procedures  Critical Care performed:   ____________________________________________   INITIAL IMPRESSION / ASSESSMENT AND PLAN / ED COURSE  Pertinent labs & imaging results  that were available during my care of the patient were reviewed by me and considered in my medical decision making (see chart for details).  Differential diagnosis includes, but is not limited to, ACS, aortic dissection, pulmonary embolism, cardiac tamponade, pneumothorax, pneumonia, pericarditis, myocarditis, GI-related causes including esophagitis/gastritis, and musculoskeletal chest wall pain, alcoholic ketoacidosis, choledocolithiasis, alcoholic hepatitis, cirrhosis  ----------------------------------------- 6:49 PM on 10/03/2018 -----------------------------------------  Patient at this time with a closed anion gap.  Decrease transaminases as well as bilirubin but still elevated.  Patient feeling improved at this time.  Still no complaints of pain.  Able to tolerate p.o. fluids and says the burning sensation in her chest is gone away after taking Maalox.  Likely alcoholic ketoacidosis.  Elevated glucose now but likely because  patient received dextrose.  Patient will be discharged with prescription for Pepcid as well as Reglan which she was discharged with last time.  The patient says that these medications helped her symptoms greatly.  I also discussed with the patient the importance of having follow-up with gastroenterology.  She is understanding of the plan and willing to comply.  She is also aware that her issue today is likely caused by drinking.  We did discussed reducing drinking and how this will likely prevent further issues.  She is understanding and willing to comply. ____________________________________________   FINAL CLINICAL IMPRESSION(S) / ED DIAGNOSES  Alcoholic ketoacidosis.  Nausea and vomiting.  Esophagitis.  NEW MEDICATIONS STARTED DURING THIS VISIT:  New Prescriptions   No medications on file     Note:  This document was prepared using Dragon voice recognition software and may include unintentional dictation errors.     Myrna Blazer, MD 10/03/18  [phone]

## 2018-10-03 NOTE — ED Notes (Signed)
ED Provider at bedside. 

## 2018-10-03 NOTE — ED Triage Notes (Signed)
Pt states vomiting began yesterday. Denies diarrhea. States sore throat from vomiting. C/o of L sided chest pain. Vomiting in triage. Appears shaking and fatigued.

## 2018-10-04 ENCOUNTER — Telehealth: Payer: Self-pay | Admitting: Gastroenterology

## 2018-10-04 NOTE — Telephone Encounter (Signed)
Unable to leave vm to contact pt for ed f/u with Dr. Allegra LaiVanga

## 2018-11-22 ENCOUNTER — Ambulatory Visit: Payer: Self-pay | Admitting: Gastroenterology

## 2018-11-22 ENCOUNTER — Other Ambulatory Visit: Payer: Self-pay

## 2018-11-22 ENCOUNTER — Encounter: Payer: Self-pay | Admitting: Gastroenterology

## 2018-11-22 ENCOUNTER — Inpatient Hospital Stay
Admission: EM | Admit: 2018-11-22 | Discharge: 2018-11-26 | DRG: 439 | Disposition: A | Discharge status: Home / Self Care | Payer: Self-pay | Attending: Internal Medicine | Admitting: Internal Medicine

## 2018-11-22 DIAGNOSIS — Z23 Encounter for immunization: Secondary | ICD-10-CM

## 2018-11-22 DIAGNOSIS — K852 Alcohol induced acute pancreatitis without necrosis or infection: Principal | ICD-10-CM | POA: Diagnosis present

## 2018-11-22 DIAGNOSIS — R112 Nausea with vomiting, unspecified: Secondary | ICD-10-CM | POA: Diagnosis present

## 2018-11-22 DIAGNOSIS — E860 Dehydration: Secondary | ICD-10-CM | POA: Diagnosis present

## 2018-11-22 DIAGNOSIS — N83299 Other ovarian cyst, unspecified side: Secondary | ICD-10-CM | POA: Insufficient documentation

## 2018-11-22 DIAGNOSIS — K2990 Gastroduodenitis, unspecified, without bleeding: Secondary | ICD-10-CM | POA: Diagnosis present

## 2018-11-22 DIAGNOSIS — I10 Essential (primary) hypertension: Secondary | ICD-10-CM | POA: Diagnosis present

## 2018-11-22 DIAGNOSIS — K2900 Acute gastritis without bleeding: Secondary | ICD-10-CM | POA: Diagnosis present

## 2018-11-22 DIAGNOSIS — Z8249 Family history of ischemic heart disease and other diseases of the circulatory system: Secondary | ICD-10-CM

## 2018-11-22 DIAGNOSIS — K7010 Alcoholic hepatitis without ascites: Secondary | ICD-10-CM | POA: Diagnosis present

## 2018-11-22 DIAGNOSIS — E872 Acidosis: Secondary | ICD-10-CM | POA: Diagnosis present

## 2018-11-22 DIAGNOSIS — D500 Iron deficiency anemia secondary to blood loss (chronic): Secondary | ICD-10-CM | POA: Diagnosis present

## 2018-11-22 DIAGNOSIS — Z7141 Alcohol abuse counseling and surveillance of alcoholic: Secondary | ICD-10-CM

## 2018-11-22 DIAGNOSIS — N179 Acute kidney failure, unspecified: Secondary | ICD-10-CM | POA: Diagnosis present

## 2018-11-22 DIAGNOSIS — F17210 Nicotine dependence, cigarettes, uncomplicated: Secondary | ICD-10-CM | POA: Diagnosis present

## 2018-11-22 DIAGNOSIS — K7460 Unspecified cirrhosis of liver: Secondary | ICD-10-CM | POA: Diagnosis present

## 2018-11-22 DIAGNOSIS — K2970 Gastritis, unspecified, without bleeding: Secondary | ICD-10-CM

## 2018-11-22 DIAGNOSIS — N920 Excessive and frequent menstruation with regular cycle: Secondary | ICD-10-CM | POA: Diagnosis present

## 2018-11-22 DIAGNOSIS — N83201 Unspecified ovarian cyst, right side: Secondary | ICD-10-CM | POA: Diagnosis present

## 2018-11-22 LAB — URINALYSIS, COMPLETE (UACMP) WITH MICROSCOPIC
Bacteria, UA: NONE SEEN
Bilirubin Urine: NEGATIVE
Glucose, UA: NEGATIVE mg/dL
Ketones, ur: 20 mg/dL — AB
Leukocytes, UA: NEGATIVE
Nitrite: NEGATIVE
Protein, ur: 30 mg/dL — AB
Specific Gravity, Urine: 1.015 (ref 1.005–1.030)
pH: 5 (ref 5.0–8.0)

## 2018-11-22 LAB — COMPREHENSIVE METABOLIC PANEL
ALK PHOS: 153 U/L — AB (ref 38–126)
ALT: 115 U/L — ABNORMAL HIGH (ref 0–44)
AST: 161 U/L — ABNORMAL HIGH (ref 15–41)
Albumin: 4.3 g/dL (ref 3.5–5.0)
Anion gap: 32 — ABNORMAL HIGH (ref 5–15)
BILIRUBIN TOTAL: 4.7 mg/dL — AB (ref 0.3–1.2)
BUN: 13 mg/dL (ref 6–20)
CO2: 11 mmol/L — ABNORMAL LOW (ref 22–32)
CREATININE: 1.21 mg/dL — AB (ref 0.44–1.00)
Calcium: 10.4 mg/dL — ABNORMAL HIGH (ref 8.9–10.3)
Chloride: 96 mmol/L — ABNORMAL LOW (ref 98–111)
GFR calc Af Amer: >60 mL/min (ref >60)
GFR calc non Af Amer: 60 mL/min — ABNORMAL LOW (ref >60)
Glucose, Bld: 116 mg/dL — ABNORMAL HIGH (ref 70–99)
Potassium: 4.9 mmol/L (ref 3.5–5.1)
Sodium: 139 mmol/L (ref 135–145)
TOTAL PROTEIN: 8.7 g/dL — AB (ref 6.5–8.1)

## 2018-11-22 LAB — POCT PREGNANCY, URINE: Preg Test, Ur: NEGATIVE

## 2018-11-22 LAB — CBC
HCT: 48.3 % — ABNORMAL HIGH (ref 36.0–46.0)
Hemoglobin: 14.4 g/dL (ref 12.0–15.0)
MCH: 30.8 pg (ref 26.0–34.0)
MCHC: 29.8 g/dL — ABNORMAL LOW (ref 30.0–36.0)
MCV: 103.2 fL — ABNORMAL HIGH (ref 80.0–100.0)
PLATELETS: 292 10*3/uL (ref 150–400)
RBC: 4.68 MIL/uL (ref 3.87–5.11)
RDW: 18.6 % — ABNORMAL HIGH (ref 11.5–15.5)
WBC: 20.7 10*3/uL — ABNORMAL HIGH (ref 4.0–10.5)
nRBC: 0 % (ref 0.0–0.2)

## 2018-11-22 LAB — LIPASE, BLOOD: Lipase: 19 U/L (ref 11–51)

## 2018-11-22 MED ORDER — PANTOPRAZOLE SODIUM 40 MG IV SOLR
40.0000 mg | Freq: Two times a day (BID) | INTRAVENOUS | Status: DC
  Administered 2018-11-22 – 2018-11-26 (×7): 40 mg via INTRAVENOUS
  Filled 2018-11-22 (×8): qty 40

## 2018-11-22 MED ORDER — ONDANSETRON HCL 4 MG/2ML IJ SOLN
4.0000 mg | Freq: Once | INTRAMUSCULAR | Status: AC
  Administered 2018-11-22: 4 mg via INTRAVENOUS
  Filled 2018-11-22: qty 2

## 2018-11-22 MED ORDER — INFLUENZA VAC SPLIT QUAD 0.5 ML IM SUSY
0.5000 mL | PREFILLED_SYRINGE | INTRAMUSCULAR | Status: AC
  Administered 2018-11-24: 0.5 mL via INTRAMUSCULAR
  Filled 2018-11-22: qty 0.5

## 2018-11-22 MED ORDER — SODIUM CHLORIDE 0.9 % IV BOLUS
1000.0000 mL | Freq: Once | INTRAVENOUS | Status: AC
  Administered 2018-11-22: 1000 mL via INTRAVENOUS

## 2018-11-22 MED ORDER — FAMOTIDINE IN NACL 20-0.9 MG/50ML-% IV SOLN
20.0000 mg | Freq: Once | INTRAVENOUS | Status: AC
  Administered 2018-11-22: 20 mg via INTRAVENOUS
  Filled 2018-11-22: qty 50

## 2018-11-22 MED ORDER — FAMOTIDINE 20 MG PO TABS
20.0000 mg | ORAL_TABLET | Freq: Two times a day (BID) | ORAL | Status: DC
  Administered 2018-11-22: 20 mg via ORAL
  Filled 2018-11-22: qty 1

## 2018-11-22 MED ORDER — ONDANSETRON HCL 4 MG/2ML IJ SOLN
4.0000 mg | Freq: Four times a day (QID) | INTRAMUSCULAR | Status: DC | PRN
  Administered 2018-11-22 – 2018-11-25 (×6): 4 mg via INTRAVENOUS
  Filled 2018-11-22 (×6): qty 2

## 2018-11-22 MED ORDER — LIDOCAINE VISCOUS HCL 2 % MT SOLN
15.0000 mL | Freq: Once | OROMUCOSAL | Status: AC
  Administered 2018-11-22: 15 mL via OROMUCOSAL
  Filled 2018-11-22: qty 15

## 2018-11-22 MED ORDER — MENTHOL 3 MG MT LOZG
1.0000 | LOZENGE | OROMUCOSAL | Status: DC | PRN
  Filled 2018-11-22 (×2): qty 9

## 2018-11-22 MED ORDER — PNEUMOCOCCAL VAC POLYVALENT 25 MCG/0.5ML IJ INJ
0.5000 mL | INJECTION | INTRAMUSCULAR | Status: AC
  Administered 2018-11-24: 0.5 mL via INTRAMUSCULAR
  Filled 2018-11-22: qty 0.5

## 2018-11-22 MED ORDER — ONDANSETRON HCL 4 MG PO TABS: 4.0000 mg | ORAL_TABLET | Freq: Four times a day (QID) | ORAL | Status: DC | PRN

## 2018-11-22 MED ORDER — ACETAMINOPHEN 325 MG PO TABS: 650.0000 mg | ORAL_TABLET | Freq: Four times a day (QID) | ORAL | Status: DC | PRN

## 2018-11-22 MED ORDER — ENOXAPARIN SODIUM 40 MG/0.4ML ~~LOC~~ SOLN
40.0000 mg | SUBCUTANEOUS | Status: DC
  Administered 2018-11-22 – 2018-11-23 (×2): 40 mg via SUBCUTANEOUS
  Filled 2018-11-22 (×3): qty 0.4

## 2018-11-22 MED ORDER — ACETAMINOPHEN 650 MG RE SUPP: 650.0000 mg | Freq: Four times a day (QID) | RECTAL | Status: DC | PRN

## 2018-11-22 MED ORDER — SODIUM CHLORIDE 0.45 % IV SOLN
INTRAVENOUS | Status: DC
  Administered 2018-11-22 – 2018-11-23 (×2): via INTRAVENOUS
  Filled 2018-11-22 (×4): qty 100

## 2018-11-22 NOTE — ED Provider Notes (Signed)
[facility] Emergency Department Provider Note ____________________________________________   First MD Initiated Contact with Patient 11/22/18 [phone]     (approximate)  I have reviewed the triage vital signs and the nursing notes.   HISTORY  Chief Complaint Emesis    HPI Sandra SamplesBrittany Lewisburg is a 33 y.o. female with PMH as noted below as well as a history of gastritis presents with vomiting over the last 2 days, associated with nausea, but not associated with abdominal pain or diarrhea.  She states it is similar to prior episodes of gastritis that she has had.  The patient reports one episode of emesis tinged with blood 2 days ago, but has not had any further blood appear in the vomitus.  She has no blood in her stool.  She denies fever.  The patient reports a history of alcohol use but has not been drinking heavily recently.  Past Medical History:  Diagnosis Date  . Anemia   . Hypertension     Patient Active Problem List   Diagnosis Date Noted  . Complex ovarian cyst 11/22/2018  . Intractable vomiting with nausea 11/22/2018    Past Surgical History:  Procedure Laterality Date  . MANDIBLE FRACTURE SURGERY      Prior to Admission medications   Medication Sig Start Date End Date Taking? Authorizing Provider  famotidine (PEPCID) 20 MG tablet Take 1 tablet (20 mg total) by mouth 2 (two) times daily. 10/03/18 10/03/19 Yes Schaevitz, Myra Rudeavid Matthew, MD  metoCLOPramide (REGLAN) 10 MG tablet Take 1 tablet (10 mg total) by mouth every 6 (six) hours as needed. Patient not taking: Reported on 11/22/2018 10/03/18   Schaevitz, Myra Rudeavid Matthew, MD    Allergies Patient has no known allergies.  Family History  Problem Relation Age of Onset  . Hepatitis C Mother   . Heart disease Mother     Social History Social History   Tobacco Use  . Smoking status: Current Every Day Smoker    Packs/day: 1.00    Types: Cigarettes  . Smokeless tobacco: Never Used  Substance  Use Topics  . Alcohol use: No    Comment: rarely   . Drug use: No    Review of Systems  Constitutional: No fever. Eyes: No redness. ENT: No neck pain. Cardiovascular: Positive for chest pain. Respiratory: Denies shortness of breath. Gastrointestinal: Positive for vomiting.  Genitourinary: Negative for flank pain.  Musculoskeletal: Negative for back pain. Skin: Negative for rash. Neurological: Negative for headache.   ____________________________________________   PHYSICAL EXAM:  VITAL SIGNS: ED Triage Vitals  Enc Vitals Group     BP 11/22/18 0641 112/82     Pulse Rate 11/22/18 0643 100     Resp 11/22/18 0641 16     Temp 11/22/18 0643 (!) 96.7 F (35.9 C)     Temp Source 11/22/18 0641 Oral     SpO2 11/22/18 0641 100 %     Weight 11/22/18 0640 134 lb (60.8 kg)     Height 11/22/18 0640 5\' 9"  (1.753 m)     Head Circumference --      Peak Flow --      Pain Score --      Pain Loc --      Pain Edu? --      Excl. in GC? --     Constitutional: Alert and oriented.  Uncomfortable appearing but in no acute distress. Eyes: Conjunctivae are normal.  No scleral icterus. Head: Atraumatic. Nose: No congestion/rhinnorhea. Mouth/Throat: Mucous membranes are dry.  Neck: Normal range of motion.  Cardiovascular: Normal rate, regular rhythm. Grossly normal heart sounds.  Good peripheral circulation. Respiratory: Normal respiratory effort.  No retractions. Lungs CTAB. Gastrointestinal: Soft and nontender. No distention.  Genitourinary: No flank tenderness. Musculoskeletal: No lower extremity edema.  Extremities warm and well perfused.  Neurologic:  Normal speech and language. No gross focal neurologic deficits are appreciated.  Skin:  Skin is warm and dry. No rash noted. Psychiatric: Mood and affect are normal. Speech and behavior are normal.  ____________________________________________   LABS (all labs ordered are listed, but only abnormal results are displayed)  Labs  Reviewed  COMPREHENSIVE METABOLIC PANEL - Abnormal; Notable for the following components:      Result Value   Chloride 96 (*)    CO2 11 (*)    Glucose, Bld 116 (*)    Creatinine, Ser 1.21 (*)    Calcium 10.4 (*)    Total Protein 8.7 (*)    AST 161 (*)    ALT 115 (*)    Alkaline Phosphatase 153 (*)    Total Bilirubin 4.7 (*)    GFR calc non Af Amer 60 (*)    Anion gap 32 (*)    All other components within normal limits  CBC - Abnormal; Notable for the following components:   WBC 20.7 (*)    HCT 48.3 (*)    MCV 103.2 (*)    MCHC 29.8 (*)    RDW 18.6 (*)    All other components within normal limits  URINALYSIS, COMPLETE (UACMP) WITH MICROSCOPIC - Abnormal; Notable for the following components:   Color, Urine YELLOW (*)    APPearance CLEAR (*)    Hgb urine dipstick SMALL (*)    Ketones, ur 20 (*)    Protein, ur 30 (*)    All other components within normal limits  COMPREHENSIVE METABOLIC PANEL - Abnormal; Notable for the following components:   Glucose, Bld 115 (*)    Calcium 8.1 (*)    Total Protein 5.4 (*)    Albumin 3.1 (*)    AST 61 (*)    ALT 60 (*)    Total Bilirubin 2.2 (*)    All other components within normal limits  LIPASE, BLOOD  POC URINE PREG, ED  POCT PREGNANCY, URINE   ____________________________________________  EKG   ____________________________________________  RADIOLOGY    ____________________________________________   PROCEDURES  Procedure(s) performed: No  Procedures  Critical Care performed: No ____________________________________________   INITIAL IMPRESSION / ASSESSMENT AND PLAN / ED COURSE  Pertinent labs & imaging results that were available during my care of the patient were reviewed by me and considered in my medical decision making (see chart for details).  33 year old female with PMH as noted above presents with nausea and vomiting over the last 2 days with no abdominal pain or fever.  She states it is similar to prior  episodes that she has had with gastritis.  I reviewed the past medical records in Epic; the patient has been seen several times for similar symptoms, most recently in December of last year.  The patient states she actually has an appointment with a gastroenterologist for this afternoon.  On exam she is uncomfortable appearing with normal vital signs, and her abdomen is soft and nontender.  The remainder of the exam is as described above.  Overall I suspect most likely gastritis or PUD, likely alcohol-related.  Differential would also include pancreatitis, other hepatobiliary cause, or gastroenteritis.  We will give fluids and  medication for symptomatic treatment, lab work-up, and reassess.  ----------------------------------------- 1:00 PM on 11/22/2018 -----------------------------------------  Lab work-up showed evidence of dehydration with significant anion gap.  The patient reported improvement in her symptoms and appeared more comfortable, however she progressively became more tachycardic despite getting fluids.  Given the lab abnormalities and persistent tachycardia as well as after further discussion with the patient we proceeded with admission.  I signed the patient out to the hospitalist Dr. Allena KatzPatel. ____________________________________________   FINAL CLINICAL IMPRESSION(S) / ED DIAGNOSES  Final diagnoses:  Gastritis and gastroduodenitis      NEW MEDICATIONS STARTED DURING THIS VISIT:  Current Discharge Medication List       Note:  This document was prepared using Dragon voice recognition software and may include unintentional dictation errors.    Dionne BucySiadecki, Fatma Rutten, MD 11/23/18 [phone]

## 2018-11-22 NOTE — ED Notes (Signed)
ED TO INPATIENT HANDOFF REPORT  Name/Age/Gender [place] 33 y.o. female  Code Status   Home/SNF/Other Home  Chief Complaint Vomiting  Level of Care/Admitting Diagnosis ED Disposition    ED Disposition Condition Comment   Admit  [facility] [[id number]]  Level of Care: Med-Surg [16]  Diagnosis: Intractable vomiting with nausea [[id number]][[id number]]  Admitting Physician: Joselyn GlassmanPATEL, SONA [[id number]]  Attending Physician: Joselyn GlassmanPATEL, SONA [[id number]]  Estimated length of stay: past midnight tomorrow  Certification:: I certify this patient will need inpatient services for at least 2 midnights  PT Class (Do Not Modify): Inpatient [101]  PT Acc Code (Do Not Modify): Private [1]       Medical History Past Medical History:  Diagnosis Date  . Anemia   . Hypertension     Allergies No Known Allergies  IV Location/Drains/Wounds Patient Lines/Drains/Airways Status   Active Line/Drains/Airways    Name:   Placement date:   Placement time:   Site:   Days:   Peripheral IV 07/13/18 Left Antecubital   07/13/18    1528    Antecubital   132   Peripheral IV 11/22/18 Left Antecubital   11/22/18    0743    Antecubital   less than 1          Labs/Imaging Results for orders placed or performed during the hospital encounter of 11/22/18 (from the past 48 hour(s))  Comprehensive metabolic panel     Status: Abnormal   Collection Time: 11/22/18  7:44 AM  Result Value Ref Range   Sodium 139 135 - 145 mmol/L    Comment: LYTES REPEATED DAS   Potassium 4.9 3.5 - 5.1 mmol/L   Chloride 96 (L) 98 - 111 mmol/L   CO2 11 (L) 22 - 32 mmol/L   Glucose, Bld 116 (H) 70 - 99 mg/dL   BUN 13 6 - 20 mg/dL   Creatinine, Ser 4.541.21 (H) 0.44 - 1.00 mg/dL   Calcium 09.810.4 (H) 8.9 - 10.3 mg/dL   Total Protein 8.7 (H) 6.5 - 8.1 g/dL   Albumin 4.3 3.5 - 5.0 g/dL   AST 119161 (H) 15 - 41 U/L    Comment: RESULT CONFIRMED BY MANUAL DILUTION DAS   ALT 115 (H) 0 - 44 U/L    Comment: RESULT CONFIRMED BY  MANUAL DILUTION DAS   Alkaline Phosphatase 153 (H) 38 - 126 U/L   Total Bilirubin 4.7 (H) 0.3 - 1.2 mg/dL   GFR calc non Af Amer 60 (L) >60 mL/min   GFR calc Af Amer >60 >60 mL/min   Anion gap 32 (H) 5 - 15    Comment: Performed at [facility], [address] [place], [place] [address]  CBC     Status: Abnormal   Collection Time: 11/22/18  7:44 AM  Result Value Ref Range   WBC 20.7 (H) 4.0 - 10.5 K/uL   RBC 4.68 3.87 - 5.11 MIL/uL   Hemoglobin 14.4 12.0 - 15.0 g/dL   HCT 95.648.3 (H) 21.336.0 - 08.646.0 %   MCV 103.2 (H) 80.0 - 100.0 fL   MCH 30.8 26.0 - 34.0 pg   MCHC 29.8 (L) 30.0 - 36.0 g/dL   RDW 57.818.6 (H) 46.911.5 - 62.915.5 %   Platelets 292 150 - 400 K/uL   nRBC 0.0 0.0 - 0.2 %    Comment: Performed at [facility], [address] [place], [place] [address]  Lipase, blood     Status: None   Collection Time: 11/22/18  7:44 AM  Result Value Ref Range   Lipase 19 11 - 51 U/L    Comment: Performed at [facility], [address] [place], [place] [address]  Urinalysis, Complete w Microscopic     Status: Abnormal   Collection Time: 11/22/18  7:46 AM  Result Value Ref Range   Color, Urine YELLOW (A) YELLOW   APPearance CLEAR (A) CLEAR   Specific Gravity, Urine 1.015 1.005 - 1.030   pH 5.0 5.0 - 8.0   Glucose, UA NEGATIVE NEGATIVE mg/dL   Hgb urine dipstick SMALL (A) NEGATIVE   Bilirubin Urine NEGATIVE NEGATIVE   Ketones, ur 20 (A) NEGATIVE mg/dL   Protein, ur 30 (A) NEGATIVE mg/dL   Nitrite NEGATIVE NEGATIVE   Leukocytes, UA NEGATIVE NEGATIVE   RBC / HPF 0-5 0 - 5 RBC/hpf   WBC, UA 0-5 0 - 5 WBC/hpf   Bacteria, UA NONE SEEN NONE SEEN   Squamous Epithelial / LPF 0-5 0 - 5   Mucus PRESENT    Hyaline Casts, UA PRESENT     Comment: Performed at [facility], [address] [place], [place] [address]  Pregnancy, urine POC     Status: None   Collection Time: 11/22/18  9:20 AM  Result Value Ref Range   Preg Test, Ur NEGATIVE NEGATIVE    Comment:         THE SENSITIVITY OF THIS METHODOLOGY IS >24 mIU/mL    No results found.  Pending Labs Unresulted Labs (From admission, onward)    Start     Ordered   11/22/18 1700  Basic metabolic panel  ONCE - STAT,   STAT    Comments:  Call me with results    11/22/18 1204   Signed and Held  CBC  (enoxaparin (LOVENOX)    CrCl >/= 30 ml/min)  Once,   R    Comments:  Baseline for enoxaparin therapy IF NOT ALREADY DRAWN.  Notify MD if PLT < 100 K.    Signed and Held   Signed and Held  Creatinine, serum  (enoxaparin (LOVENOX)    CrCl >/= 30 ml/min)  Once,   R    Comments:  Baseline for enoxaparin therapy IF NOT ALREADY DRAWN.    Signed and Held   Signed and Held  Creatinine, serum  (enoxaparin (LOVENOX)    CrCl >/= 30 ml/min)  Weekly,   R    Comments:  while on enoxaparin therapy    Signed and Held   Signed and Held  Comprehensive metabolic panel  Tomorrow morning,   R     Signed and Held          Vitals/Pain Today's Vitals   11/22/18 1119 11/22/18 1121 11/22/18 1145 11/22/18 1245  BP: 115/69  112/71 115/68  Pulse: (!) 125  (!) 125 (!) 127  Resp: 16  15 (!) 22  Temp:      TempSrc:      SpO2: 100%  100% 100%  Weight:      Height:      PainSc:  0-No pain      Isolation Precautions No active isolations  Medications Medications  sodium bicarbonate 100 mEq in sodium chloride 0.45 % 1,000 mL infusion (has no administration in time range)  famotidine (PEPCID) tablet 20 mg (has no administration in time range)  sodium chloride 0.9 % bolus 1,000 mL (0 mLs Intravenous Stopped 11/22/18 0901)  ondansetron (ZOFRAN) injection 4 mg (4 mg Intravenous Given 11/22/18 0743)  famotidine (PEPCID) IVPB 20 mg premix (0 mg Intravenous Stopped 11/22/18 0901)  lidocaine (XYLOCAINE) 2 % viscous mouth solution 15 mL (15 mLs Mouth/Throat Given 11/22/18 0809)  sodium chloride 0.9 % bolus 1,000 mL (0 mLs Intravenous Stopped 11/22/18 1024)    Mobility walks

## 2018-11-22 NOTE — Progress Notes (Signed)
   11/22/18 1600  Clinical Encounter Type  Visited With Patient  Visit Type Initial   Chaplain received an OR for prayer. Upon arrival to the patient's room, patient was sitting upright in the bed. She was warm and welcoming, but stated that she was not feeling well enough to talk. Chaplain will return later or pass this request on to [facility] for follow-up.

## 2018-11-22 NOTE — H&P (Signed)
[facility] - [place] at [facility]   PATIENT NAME: Sandra Pearson [place]    MR#:  [id number]  DATE OF BIRTH:  May 25, 1986  DATE OF ADMISSION:  11/22/2018  PRIMARY CARE PHYSICIAN: Patient, No Pcp Per   REQUESTING/REFERRING PHYSICIAN: Dr Marisa SeverinSiadecki  CHIEF COMPLAINT:  intractable nausea vomiting for 2 to 3 days  HISTORY OF PRESENT ILLNESS:  Sandra Pearson [place]  is a 33 y.o. female with a known history of alcohol abuse, gastritis, chronic anemia comes to the emergency room with history of intractable nausea and vomiting unable to keep anything orally for last couple days. Patient has history of alcohol abuse she reports drinking a glass of wine about a week ago. Patient has history of gastritis and was supposed to see G[facility]. at [facility] today. She was found to be severely acidotic with anion gap of 32. Patient is being admitted for further evaluation of management.  PAST MEDICAL HISTORY:   Past Medical History:  Diagnosis Date  . Anemia   . Hypertension     PAST SURGICAL HISTOIRY:   Past Surgical History:  Procedure Laterality Date  . MANDIBLE FRACTURE SURGERY      SOCIAL HISTORY:   Social History   Tobacco Use  . Smoking status: Current Every Day Smoker    Packs/day: 1.00    Types: Cigarettes  . Smokeless tobacco: Never Used  Substance Use Topics  . Alcohol use: No    Comment: rarely     FAMILY HISTORY:   Family History  Problem Relation Age of Onset  . Hepatitis C Mother   . Heart disease Mother     DRUG ALLERGIES:  No Known Allergies  REVIEW OF SYSTEMS:  Review of Systems  Constitutional: Negative for chills, fever and weight loss.  HENT: Negative for ear discharge, ear pain and nosebleeds.   Eyes: Negative for blurred vision, pain and discharge.  Respiratory: Negative for sputum production, shortness of breath, wheezing and stridor.   Cardiovascular: Negative for chest pain, palpitations, orthopnea and PND.  Gastrointestinal:  Positive for nausea and vomiting. Negative for abdominal pain and diarrhea.  Genitourinary: Negative for frequency and urgency.  Musculoskeletal: Negative for back pain and joint pain.  Neurological: Positive for weakness. Negative for sensory change, speech change and focal weakness.  Psychiatric/Behavioral: Negative for depression and hallucinations. The patient is not nervous/anxious.      MEDICATIONS AT HOME:   Prior to Admission medications   Medication Sig Start Date End Date Taking? Authorizing Provider  famotidine (PEPCID) 20 MG tablet Take 1 tablet (20 mg total) by mouth 2 (two) times daily. 10/03/18 10/03/19 Yes Schaevitz, Myra Rudeavid Matthew, MD  metoCLOPramide (REGLAN) 10 MG tablet Take 1 tablet (10 mg total) by mouth every 6 (six) hours as needed. Patient not taking: Reported on 11/22/2018 10/03/18   Myrna BlazerSchaevitz, David Matthew, MD      VITAL SIGNS:  Blood pressure 112/71, pulse (!) 125, temperature (!) 96.7 F (35.9 C), temperature source Axillary, resp. rate 15, height 5\' 9"  (1.753 m), weight 60.8 kg, last menstrual period 11/19/2018, SpO2 100 %, unknown if currently breastfeeding.  PHYSICAL EXAMINATION:  GENERAL:  33 y.o.-year-old patient lying in the bed with no acute distress. Thin cachectic frail EYES: Pupils equal, round, reactive to light and accommodation. positive scleral icterus. Extraocular muscles intact.  HEENT: Head atraumatic, normocephalic. Oropharynx and nasopharynx clear. Oral mucosa dry NECK:  Supple, no jugular venous distention. No thyroid enlargement, no tenderness.  LUNGS: Normal breath sounds bilaterally, no wheezing, rales,rhonchi or  crepitation. No use of accessory muscles of respiration.  CARDIOVASCULAR: S1, S2 normal. No murmurs, rubs, or gallops.  ABDOMEN: Soft, nontender, nondistended. Bowel sounds present. No organomegaly or mass.  EXTREMITIES: No pedal edema, cyanosis, or clubbing.  NEUROLOGIC: Cranial nerves II through XII are intact. Muscle strength  5/5 in all extremities. Sensation intact. Gait not checked.  PSYCHIATRIC: The patient is alert and oriented x 3.  SKIN: No obvious rash, lesion, or ulcer.   LABORATORY PANEL:   CBC Recent Labs  Lab 11/22/18 0744  WBC 20.7*  HGB 14.4  HCT 48.3*  PLT 292   ------------------------------------------------------------------------------------------------------------------  Chemistries  Recent Labs  Lab 11/22/18 0744  NA 139  K 4.9  CL 96*  CO2 11*  GLUCOSE 116*  BUN 13  CREATININE 1.21*  CALCIUM 10.4*  AST 161*  ALT 115*  ALKPHOS 153*  BILITOT 4.7*   ------------------------------------------------------------------------------------------------------------------  Cardiac Enzymes No results for input(s): TROPONINI in the last 168 hours. ------------------------------------------------------------------------------------------------------------------  RADIOLOGY:  No results found.  EKG:    IMPRESSION AND PLAN:   Laiah Lian  is a 33 y.o. female with a known history of alcohol abuse, gastritis, chronic anemia comes to the emergency room with history of intractable nausea and vomiting unable to keep anything orally for last couple days. Patient has history of alcohol abuse she reports drinking a glass of wine about a week ago.  1. Intractable nausea vomiting with severe alcoholic ketoacidosis -to medical floor -received 2 L of normal saline--- will continue IV fluids with bicarb and severe acidosis -PRN nausea meds -clear liquid diet advance as tolerated  2. Acute on chronic gastritis alcohol-related -continue Pepcid AC BID -patient will follow G.I. as outpatient as per schedule. No evidence of G.I. bleed  3. Elevated LFTs/bilirubin with abnormal ultrasound abdomen suggestive alcohol induced hepatitis -strongly advised on abstinence from alcohol  4. Leukocytosis appears reactive from number one -continue to monitor  5. Acute renal failure with acidosis  secondary to G.I. losses -avoid nephrotoxic's, monitor eyes and nose and aggressive IV hydration  6. DVT prophylaxis subcu Lovenox  All the records are reviewed and case discussed with ED provider.   CODE STATUS: full  TOTAL TIME TAKING CARE OF THIS PATIENT: *50* minutes.    Enedina Finner M.D on 11/22/2018 at 12:36 PM  Between 7am to 6pm - Pager - [phone]  After 6pm go to [email or web address] - password [email or web address] [facility]  [facility]  Office  [phone]  CC: Primary care physician; Patient, No Pcp Per

## 2018-11-22 NOTE — Progress Notes (Signed)
   11/22/18 1800  Clinical Encounter Type  Visited With Patient and family together  Visit Type Follow-up  Ch attempted a visit. Pt was asleep. The unit Ch will follow-up the next day.

## 2018-11-22 NOTE — ED Triage Notes (Signed)
Pt states vomiting that began two days ago. Pt denies known fever, denies abd pain and diarrhea. Pt appears in no acute distress.

## 2018-11-23 ENCOUNTER — Encounter: Payer: Self-pay | Admitting: Radiology

## 2018-11-23 ENCOUNTER — Inpatient Hospital Stay: Payer: Self-pay

## 2018-11-23 LAB — COMPREHENSIVE METABOLIC PANEL
ALT: 60 U/L — ABNORMAL HIGH (ref 0–44)
AST: 61 U/L — AB (ref 15–41)
Albumin: 3.1 g/dL — ABNORMAL LOW (ref 3.5–5.0)
Alkaline Phosphatase: 72 U/L (ref 38–126)
Anion gap: 8 (ref 5–15)
BILIRUBIN TOTAL: 2.2 mg/dL — AB (ref 0.3–1.2)
BUN: 13 mg/dL (ref 6–20)
CO2: 22 mmol/L (ref 22–32)
CREATININE: 0.74 mg/dL (ref 0.44–1.00)
Calcium: 8.1 mg/dL — ABNORMAL LOW (ref 8.9–10.3)
Chloride: 106 mmol/L (ref 98–111)
GFR calc Af Amer: >60 mL/min (ref >60)
GFR calc non Af Amer: >60 mL/min (ref >60)
Glucose, Bld: 115 mg/dL — ABNORMAL HIGH (ref 70–99)
Potassium: 3.7 mmol/L (ref 3.5–5.1)
Sodium: 136 mmol/L (ref 135–145)
Total Protein: 5.4 g/dL — ABNORMAL LOW (ref 6.5–8.1)

## 2018-11-23 LAB — LIPASE, BLOOD: Lipase: 472 U/L — ABNORMAL HIGH (ref 11–51)

## 2018-11-23 MED ORDER — OXYCODONE HCL 5 MG PO TABS
5.0000 mg | ORAL_TABLET | Freq: Four times a day (QID) | ORAL | Status: DC | PRN
  Administered 2018-11-23 – 2018-11-24 (×2): 5 mg via ORAL
  Filled 2018-11-23 (×2): qty 1

## 2018-11-23 MED ORDER — IOHEXOL 350 MG/ML SOLN
75.0000 mL | Freq: Once | INTRAVENOUS | Status: AC | PRN
  Administered 2018-11-23: 75 mL via INTRAVENOUS

## 2018-11-23 MED ORDER — SODIUM CHLORIDE 0.9 % IV SOLN
INTRAVENOUS | Status: DC
  Administered 2018-11-23 – 2018-11-25 (×6): via INTRAVENOUS

## 2018-11-23 MED ORDER — ALUM & MAG HYDROXIDE-SIMETH 200-200-20 MG/5ML PO SUSP
30.0000 mL | Freq: Four times a day (QID) | ORAL | Status: DC | PRN
  Administered 2018-11-23 (×2): 30 mL via ORAL
  Filled 2018-11-23 (×2): qty 30

## 2018-11-23 NOTE — Progress Notes (Signed)
[facility] [place] at [facility]   PATIENT NAME: Sandra Pearson    MR#:  [id number]  DATE OF BIRTH:  1986/02/03  SUBJECTIVE:  CHIEF COMPLAINT:   Chief Complaint  Patient presents with  . Emesis   -Complains of significant abdominal pain, nausea and vomiting.  Also chest pain more of a burning sensation.  REVIEW OF SYSTEMS:  Review of Systems  Constitutional: Positive for malaise/fatigue. Negative for chills and fever.  HENT: Negative for ear discharge, hearing loss and nosebleeds.   Eyes: Negative for blurred vision and double vision.  Respiratory: Negative for cough, shortness of breath and wheezing.   Cardiovascular: Negative for chest pain and palpitations.  Gastrointestinal: Positive for abdominal pain, nausea and vomiting. Negative for constipation and diarrhea.  Genitourinary: Negative for dysuria.  Musculoskeletal: Negative for myalgias.  Neurological: Negative for dizziness, focal weakness, seizures, weakness and headaches.  Psychiatric/Behavioral: Negative for depression.    DRUG ALLERGIES:  No Known Allergies  VITALS:  Blood pressure 115/84, pulse 97, temperature 98.7 F (37.1 C), resp. rate 14, height 5\' 9"  (1.753 m), weight 60.8 kg, last menstrual period 11/19/2018, SpO2 100 %, unknown if currently breastfeeding.  PHYSICAL EXAMINATION:  Physical Exam   GENERAL:  33 y.o.-year-old patient lying in the bed with no acute distress.  EYES: Pupils equal, round, reactive to light and accommodation. No scleral icterus. Extraocular muscles intact.  HEENT: Head atraumatic, normocephalic. Oropharynx and nasopharynx clear.  NECK:  Supple, no jugular venous distention. No thyroid enlargement, no tenderness.  LUNGS: Normal breath sounds bilaterally, no wheezing, rales,rhonchi or crepitation. No use of accessory muscles of respiration.  CARDIOVASCULAR: S1, S2 normal. No murmurs, rubs, or gallops.  ABDOMEN: Soft, diffusely tender in the upper abdomen  without any guarding or rigidity, nondistended. Bowel sounds present. No organomegaly or mass.  EXTREMITIES: No pedal edema, cyanosis, or clubbing.  NEUROLOGIC: Cranial nerves II through XII are intact. Muscle strength 5/5 in all extremities. Sensation intact. Gait not checked.  PSYCHIATRIC: The patient is alert and oriented x 3.  SKIN: No obvious rash, lesion, or ulcer.    LABORATORY PANEL:   CBC Recent Labs  Lab 11/22/18 0744  WBC 20.7*  HGB 14.4  HCT 48.3*  PLT 292   ------------------------------------------------------------------------------------------------------------------  Chemistries  Recent Labs  Lab 11/23/18 0345  NA 136  K 3.7  CL 106  CO2 22  GLUCOSE 115*  BUN 13  CREATININE 0.74  CALCIUM 8.1*  AST 61*  ALT 60*  ALKPHOS 72  BILITOT 2.2*   ------------------------------------------------------------------------------------------------------------------  Cardiac Enzymes No results for input(s): TROPONINI in the last 168 hours. ------------------------------------------------------------------------------------------------------------------  RADIOLOGY:  No results found.  EKG:   Orders placed or performed during the hospital encounter of 11/22/18  . Repeat EKG  . Repeat EKG    ASSESSMENT AND PLAN:   33 year old female with past medical history significant for gastritis, anemia and hypertension presents to hospital secondary to abdominal pain nausea and vomiting  1.  Acute abdominal pain with nausea and vomiting-given alcohol history, lipase was checked which was negative. -However patient complains of very typical pain in the epigastric region radiating like a band to the back. -Continue IV fluids.  Change to n.p.o. status -CT of the abdomen has been ordered and follow-up.  2.  Acute on chronic gastritis-continue PPI twice daily for now -Since hemoglobin is stable we will do conservative management -If symptoms do not resolve,, consider GI  consult as inpatient  3.  Elevated liver function test-likely  alcohol induced hepatitis/cirrhosis. -Improving.  Monitor  4.  Acute renal failure with metabolic acidosis-improving with IV fluids.  Prerenal causes and GI losses likely  5.  Leukocytosis-stress reaction.  Monitor in a.m.  No evidence of any infection at this time.  6.  DVT prophylaxis-Lovenox     All the records are reviewed and case discussed with Care Management/Social Workerr. Management plans discussed with the patient, family and they are in agreement.  CODE STATUS: Full code  TOTAL TIME TAKING CARE OF THIS PATIENT: 38 minutes.   POSSIBLE D/C IN 2 DAYS, DEPENDING ON CLINICAL CONDITION.   Enid Baas M.D on 11/23/2018 at 1:22 PM  Between 7am to 6pm - Pager - [phone]  After 6pm go to [email or web address] - password [email or web address]  [facility] [place] [facility]  Office  [phone]  CC: Primary care physician; Patient, No Pcp Per

## 2018-11-23 NOTE — Progress Notes (Signed)
   11/23/18 1100  Clinical Encounter Type  Visited With Patient and family together  Visit Type Follow-up  Referral From Nurse  Stress Factors  Patient Stress Factors Health changes    Chaplain followed up with the patient regarding a request to visit yesterday. At that time the patient was feeling nauseated. Today, she says she is feeling a bit better, but awaiting further testing. Patient is scheduled to get married tomorrow, which will still happen; pastor will perform the ceremony in the hospital. The patient and her fiance plan to have a ceremony at [employer], reception, and honeymoon at a later date. Patient and fiance shared that they have some stressors, but one of the biggest concerns is her health at the moment. Her fiance states that a major burden will be lifted once they are married. We talked briefly about good relationships and what they find to be beneficial in them; one of those assets is good communication, which they both acknowledge as one of the strong suits of their relationship. Nurse arrived to check in with the patient. Will follow up later.

## 2018-11-24 LAB — COMPREHENSIVE METABOLIC PANEL
ALBUMIN: 3.1 g/dL — AB (ref 3.5–5.0)
ALT: 49 U/L — ABNORMAL HIGH (ref 0–44)
AST: 57 U/L — ABNORMAL HIGH (ref 15–41)
Alkaline Phosphatase: 70 U/L (ref 38–126)
Anion gap: 11 (ref 5–15)
BUN: 22 mg/dL — ABNORMAL HIGH (ref 6–20)
CO2: 23 mmol/L (ref 22–32)
Calcium: 8 mg/dL — ABNORMAL LOW (ref 8.9–10.3)
Chloride: 103 mmol/L (ref 98–111)
Creatinine, Ser: 0.74 mg/dL (ref 0.44–1.00)
GFR calc Af Amer: >60 mL/min (ref >60)
GFR calc non Af Amer: >60 mL/min (ref >60)
Glucose, Bld: 59 mg/dL — ABNORMAL LOW (ref 70–99)
Potassium: 3.6 mmol/L (ref 3.5–5.1)
Sodium: 137 mmol/L (ref 135–145)
Total Bilirubin: 2.3 mg/dL — ABNORMAL HIGH (ref 0.3–1.2)
Total Protein: 5.4 g/dL — ABNORMAL LOW (ref 6.5–8.1)

## 2018-11-24 LAB — CBC
HCT: 30.6 % — ABNORMAL LOW (ref 36.0–46.0)
Hemoglobin: 9.4 g/dL — ABNORMAL LOW (ref 12.0–15.0)
MCH: 30.2 pg (ref 26.0–34.0)
MCHC: 30.7 g/dL (ref 30.0–36.0)
MCV: 98.4 fL (ref 80.0–100.0)
PLATELETS: 144 10*3/uL — AB (ref 150–400)
RBC: 3.11 MIL/uL — AB (ref 3.87–5.11)
RDW: 18.6 % — ABNORMAL HIGH (ref 11.5–15.5)
WBC: 8.1 10*3/uL (ref 4.0–10.5)
nRBC: 0 % (ref 0.0–0.2)

## 2018-11-24 LAB — LIPASE, BLOOD: Lipase: 559 U/L — ABNORMAL HIGH (ref 11–51)

## 2018-11-24 LAB — HEMOGLOBIN AND HEMATOCRIT, BLOOD
HCT: 27.9 % — ABNORMAL LOW (ref 36.0–46.0)
HCT: 27.1 % — ABNORMAL LOW (ref 36.0–46.0)
Hemoglobin: 8.4 g/dL — ABNORMAL LOW (ref 12.0–15.0)
Hemoglobin: 8.3 g/dL — ABNORMAL LOW (ref 12.0–15.0)

## 2018-11-24 NOTE — Consult Note (Addendum)
GI Inpatient Consult Note  Reason for Consult: Anemia, GI Bleed   Attending Requesting Consult: Dr. Amado Coe, MD  History of Present Illness: Sandra Pearson is a 33 y.o. female seen for evaluation of anemia, GI Bleed at the request of Dr. Amado Coe, MD. Pt has a PMH of alcohol abuse, gastritis, chronic anemia who was admitted two days ago for intractable nausea and vomiting and epigastric abd pain radiating to the back. CT scan abd/pelvis yesterday showing peripancreatic edema extending adjacent to duodenum, posterior stomach suggestive of acute pancreatitis. There was no ductal dilatation or mass. Lipase yesterday 472 and 559 today. She also had 5 gram drop in hemoglobin overnight - from 14.4 to 9.4 this morning. Repeat hemoglobin this afternoon 8.4.   Pt seen and examined this afternoon sitting upright in hospital bed. She reports epigastric pain is better than yesterday, but has still required some PO pain medication last night and around noon today. Pain level is currently 3/10 with no radiation of her pain. She hasn't had any other episodes of vomiting, but has remained pretty nauseous throughout the day. She denies any recent changes in bowel habits. She denies bright red bloody stools, melena, hematemesis, or vaginal bleeding. Last period was 4 days ago but reports this wasn't heavy. She denies frequent NSAID use. She will drink wine occasionally, but denies binge drinking. She denies heartburn or reflux symptoms, dysphagia, odynophagia, or early satiety. No previous endoscopic procedures. She actually had appt to see [facility] this week. Per chart review, she was seen over a month ago for nausea and vomiting in setting of excessive alcohol use (3 vodka mixed drinks daily). She denies illicit drug use.    Last Colonoscopy: N/A Last Endoscopy: N/A   Past Medical History:  Past Medical History:  Diagnosis Date  . Anemia   . Hypertension     Problem List: Patient Active Problem List   Diagnosis Date Noted  . Complex ovarian cyst 11/22/2018  . Intractable vomiting with nausea 11/22/2018    Past Surgical History: Past Surgical History:  Procedure Laterality Date  . MANDIBLE FRACTURE SURGERY      Allergies: No Known Allergies  Home Medications: Medications Prior to Admission  Medication Sig Dispense Refill Last Dose  . famotidine (PEPCID) 20 MG tablet Take 1 tablet (20 mg total) by mouth 2 (two) times daily. 60 tablet 0 Past Week at Unknown time  . metoCLOPramide (REGLAN) 10 MG tablet Take 1 tablet (10 mg total) by mouth every 6 (six) hours as needed. (Patient not taking: Reported on 11/22/2018) 12 tablet 0 Not Taking at Unknown time   Home medication reconciliation was completed with the patient.   Scheduled Inpatient Medications:   . enoxaparin (LOVENOX) injection  40 mg Subcutaneous Q24H  . pantoprazole (PROTONIX) IV  40 mg Intravenous Q12H    Continuous Inpatient Infusions:   . sodium chloride 125 mL/hr at 11/24/18 1218    PRN Inpatient Medications:  acetaminophen **OR** acetaminophen, alum & mag hydroxide-simeth, menthol-cetylpyridinium, ondansetron **OR** ondansetron (ZOFRAN) IV, oxyCODONE  Family History: family history includes Heart disease in her mother; Hepatitis C in her mother.  The patient's family history is negative for inflammatory bowel disorders, GI malignancy, or solid organ transplantation.  Social History:   reports that she has been smoking cigarettes. She has been smoking about 1.00 pack per day. She has never used smokeless tobacco. She reports that she does not drink alcohol or use drugs. The patient denies ETOH, tobacco, or drug use.   Review  of Systems: Constitutional: Weight is stable.  Eyes: No changes in vision. ENT: No oral lesions, sore throat.  GI: see HPI.  Heme/Lymph: No easy bruising.  CV: No chest pain.  GU: No hematuria.  Integumentary: No rashes.  Neuro: No headaches.  Psych: No depression/anxiety.  Endocrine:  No heat/cold intolerance.  Allergic/Immunologic: No urticaria.  Resp: No cough, SOB.  Musculoskeletal: No joint swelling.    Physical Examination: BP 117/77 (BP Location: Right Arm)   Pulse 94   Temp 97.8 F (36.6 C) (Oral)   Resp 18   Ht 5\' 9"  (1.753 m)   Wt 60.8 kg   LMP 11/19/2018   SpO2 100%   BMI 19.79 kg/m  Gen: NAD, alert and oriented x 4 HEENT: PEERLA, EOMI, Neck: supple, no JVD or thyromegaly Chest: CTA bilaterally, no wheezes, crackles, or other adventitious sounds CV: RRR, no m/g/c/r Abd: soft, TTP in epigastric and LUQ, ND, +BS in all four quadrants; no HSM, guarding, ridigity, or rebound tenderness Ext: no edema, well perfused with 2+ pulses, Skin: no rash or lesions noted Lymph: no LAD  Data: Lab Results  Component Value Date   WBC 8.1 11/24/2018   HGB 8.4 (L) 11/24/2018   HCT 27.9 (L) 11/24/2018   MCV 98.4 11/24/2018   PLT 144 (L) 11/24/2018   Recent Labs  Lab 11/22/18 0744 11/24/18 0329 11/24/18 1351  HGB 14.4 9.4* 8.4*   Lab Results  Component Value Date   NA 137 11/24/2018   K 3.6 11/24/2018   CL 103 11/24/2018   CO2 23 11/24/2018   BUN 22 (H) 11/24/2018   CREATININE 0.74 11/24/2018   Lab Results  Component Value Date   ALT 49 (H) 11/24/2018   AST 57 (H) 11/24/2018   ALKPHOS 70 11/24/2018   BILITOT 2.3 (H) 11/24/2018   No results for input(s): APTT, INR, PTT in the last 168 hours. Assessment/Plan:  33 y/o Caucasian female with a PMH of gastritis, anemia, and hypertension admitted for nausea, vomiting, later found to have acute pancreatitis  1. Acute pancreatitis 2. Anemia likely s/t UGI bleed  - Findings of acute pancreatitis on CT scan yesterday. Continue symptomatic management with IVF and pain medication. WBC returned to normal today. Consider clears when she isn't requiring pain medication. - 6 gram drop in hemoglobin over the past two days is very concerning. No signs of overt GI bleeding. Hemoccult is pending. Concern for  possible ulcer given her epigastric pain. We will check H pylori stool antigen. Differential also includes esophagitis, gastritis, duodenitis, AVMs, Mallory-Weiss tear - Will hold off on endoscopic procedures at this time given no signs of overt GI bleeding.  - Continue PPI BID - We will continue to follow along. Please call with any acute changes.   Thank you for the consult. Please call with questions or concerns.  Gilda CreaseGran[place] M Hardeep Reetz, PA-C [facility]

## 2018-11-24 NOTE — Progress Notes (Addendum)
[facility]   PATIENT NAME: Sandra Pearson    MR#:  [id number]  DATE OF BIRTH:  [birth year]  SUBJECTIVE:  CHIEF COMPLAINT:   Chief Complaint  Patient presents with  . Emesis   -Complains of significant abdominal pain 6-7 out of 10, endorsing dry mouth with nausea, denies vomiting.  Denies any chest pain today  REVIEW OF SYSTEMS:  Review of Systems  Constitutional: Positive for malaise/fatigue. Negative for chills and fever.  HENT: Negative for ear discharge, hearing loss and nosebleeds.   Eyes: Negative for blurred vision and double vision.  Respiratory: Negative for cough, shortness of breath and wheezing.   Cardiovascular: Negative for chest pain and palpitations.  Gastrointestinal: Positive for abdominal pain, nausea and vomiting. Negative for constipation and diarrhea.  Genitourinary: Negative for dysuria.  Musculoskeletal: Negative for myalgias.  Neurological: Negative for dizziness, focal weakness, seizures, weakness and headaches.  Psychiatric/Behavioral: Negative for depression.    DRUG ALLERGIES:  No Known Allergies  VITALS:  Blood pressure 117/77, pulse 94, temperature 97.8 F (36.6 C), temperature source Oral, resp. rate 18, height 5\' 9"  (1.753 m), weight 60.8 kg, last menstrual period 11/19/2018, SpO2 100 %, unknown if currently breastfeeding.  PHYSICAL EXAMINATION:  Physical Exam   GENERAL:  33 y.o.-year-old patient lying in the bed with no acute distress.  EYES: Pupils equal, round, reactive to light and accommodation. No scleral icterus. Extraocular muscles intact.  HEENT: Head atraumatic, normocephalic. Oropharynx and nasopharynx clear.  NECK:  Supple, no jugular venous distention. No thyroid enlargement, no tenderness.  LUNGS: Normal breath sounds bilaterally, no wheezing, rales,rhonchi or crepitation. No use of accessory muscles of respiration.  CARDIOVASCULAR: S1, S2 normal. No murmurs, rubs, or gallops.    ABDOMEN: Soft, diffusely tender in the upper abdomen without any guarding or rigidity, nondistended. Bowel sounds present.  EXTREMITIES: No pedal edema, cyanosis, or clubbing.  NEUROLOGIC: Awake alert and oriented x3. Sensation intact. Gait not checked.  PSYCHIATRIC: The patient is alert and oriented x 3.  SKIN: No obvious rash, lesion, or ulcer.    LABORATORY PANEL:   CBC Recent Labs  Lab 11/24/18 0329  WBC 8.1  HGB 9.4*  HCT 30.6*  PLT 144*   ------------------------------------------------------------------------------------------------------------------  Chemistries  Recent Labs  Lab 11/24/18 0329  NA 137  K 3.6  CL 103  CO2 23  GLUCOSE 59*  BUN 22*  CREATININE 0.74  CALCIUM 8.0*  AST 57*  ALT 49*  ALKPHOS 70  BILITOT 2.3*   ------------------------------------------------------------------------------------------------------------------  Cardiac Enzymes No results for input(s): TROPONINI in the last 168 hours. ------------------------------------------------------------------------------------------------------------------  RADIOLOGY:  Ct Angio Chest Pe W Or Wo Contrast  Result Date: 11/23/2018 CLINICAL DATA:  Shortness of breath, vomiting for 2 days, unable to keep anything down, history hypertension, smoker EXAM: CT ANGIOGRAPHY CHEST CT ABDOMEN AND PELVIS WITH CONTRAST TECHNIQUE: Multidetector CT imaging of the chest was performed using the standard protocol during bolus administration of intravenous contrast. Multiplanar CT image reconstructions and MIPs were obtained to evaluate the vascular anatomy. Multidetector CT imaging of the abdomen and pelvis was performed using the standard protocol during bolus administration of intravenous contrast. CONTRAST:  75mL OMNIPAQUE IOHEXOL 350 MG/ML SOLN IV. Patient unable to tolerate p.o. contrast. COMPARISON:  CT abdomen and pelvis 07/13/2018 FINDINGS: CTA CHEST FINDINGS Cardiovascular: Aorta normal caliber without  aneurysm or dissection. No pericardial effusion. Heart normal size. Pulmonary arteries well opacified and patent. No evidence of pulmonary embolism. Mediastinum/Nodes: Esophagus normal appearance. Base of  cervical region unremarkable. No thoracic adenopathy. Lungs/Pleura: Lungs clear. No infiltrate, pleural effusion, pneumothorax, or mass. Musculoskeletal: Unremarkable Review of the MIP images confirms the above findings. CT ABDOMEN and PELVIS FINDINGS Hepatobiliary: Diffuse fatty infiltration of liver. Minimal focal sparing adjacent to gallbladder fossa. No additional hepatic abnormalities. Pancreas: Peripancreatic edema extending adjacent to the duodenum, posterior margin of stomach, and in the RIGHT anterior pararenal fascia to the lateral conal fascia consistent with acute pancreatitis. No pancreatic ductal dilatation or obvious mass. Spleen: Normal appearance Adrenals/Urinary Tract: Adrenal glands normal appearance. Dense contrast within renal medullary pyramids. Kidneys, ureters, and bladder normal appearance Stomach/Bowel: Appendix not visualized but no definite pericecal inflammatory process seen. Stomach and bowel loops unremarkable. Vascular/Lymphatic: Few pelvic phleboliths. Aorta normal caliber. Major vascular structures patent including portal vein, splenic vein, and superior mesenteric vein. Reproductive: Unremarkable uterus and LEFT ovary for age. Decrease in size of RIGHT ovarian bilobed cyst versus adjacent cysts 4.1 x 1.9 cm versus previous 4.5 x 2.4 cm. Other: Minimal free pelvic fluid in cul-de-sac.  No free air. Musculoskeletal: Unremarkable Review of the MIP images confirms the above findings. IMPRESSION: No evidence of pulmonary embolism. Acute pancreatitis without acute complication. Decreased size of RIGHT ovarian cyst. Electronically Signed   By: Ulyses Southward M.D.   On: 11/23/2018 13:20   Ct Abdomen Pelvis W Contrast  Result Date: 11/23/2018 CLINICAL DATA:  Shortness of breath, vomiting  for 2 days, unable to keep anything down, history hypertension, smoker EXAM: CT ANGIOGRAPHY CHEST CT ABDOMEN AND PELVIS WITH CONTRAST TECHNIQUE: Multidetector CT imaging of the chest was performed using the standard protocol during bolus administration of intravenous contrast. Multiplanar CT image reconstructions and MIPs were obtained to evaluate the vascular anatomy. Multidetector CT imaging of the abdomen and pelvis was performed using the standard protocol during bolus administration of intravenous contrast. CONTRAST:  75mL OMNIPAQUE IOHEXOL 350 MG/ML SOLN IV. Patient unable to tolerate p.o. contrast. COMPARISON:  CT abdomen and pelvis 07/13/2018 FINDINGS: CTA CHEST FINDINGS Cardiovascular: Aorta normal caliber without aneurysm or dissection. No pericardial effusion. Heart normal size. Pulmonary arteries well opacified and patent. No evidence of pulmonary embolism. Mediastinum/Nodes: Esophagus normal appearance. Base of cervical region unremarkable. No thoracic adenopathy. Lungs/Pleura: Lungs clear. No infiltrate, pleural effusion, pneumothorax, or mass. Musculoskeletal: Unremarkable Review of the MIP images confirms the above findings. CT ABDOMEN and PELVIS FINDINGS Hepatobiliary: Diffuse fatty infiltration of liver. Minimal focal sparing adjacent to gallbladder fossa. No additional hepatic abnormalities. Pancreas: Peripancreatic edema extending adjacent to the duodenum, posterior margin of stomach, and in the RIGHT anterior pararenal fascia to the lateral conal fascia consistent with acute pancreatitis. No pancreatic ductal dilatation or obvious mass. Spleen: Normal appearance Adrenals/Urinary Tract: Adrenal glands normal appearance. Dense contrast within renal medullary pyramids. Kidneys, ureters, and bladder normal appearance Stomach/Bowel: Appendix not visualized but no definite pericecal inflammatory process seen. Stomach and bowel loops unremarkable. Vascular/Lymphatic: Few pelvic phleboliths. Aorta  normal caliber. Major vascular structures patent including portal vein, splenic vein, and superior mesenteric vein. Reproductive: Unremarkable uterus and LEFT ovary for age. Decrease in size of RIGHT ovarian bilobed cyst versus adjacent cysts 4.1 x 1.9 cm versus previous 4.5 x 2.4 cm. Other: Minimal free pelvic fluid in cul-de-sac.  No free air. Musculoskeletal: Unremarkable Review of the MIP images confirms the above findings. IMPRESSION: No evidence of pulmonary embolism. Acute pancreatitis without acute complication. Decreased size of RIGHT ovarian cyst. Electronically Signed   By: Ulyses Southward M.D.   On: 11/23/2018 13:20    EKG:  Orders placed or performed during the hospital encounter of 11/22/18  . Repeat EKG  . Repeat EKG    ASSESSMENT AND PLAN:   33 year old female with past medical history significant for gastritis, anemia and hypertension presents to hospital secondary to abdominal pain nausea and vomiting  1.  Acute abdominal pain with nausea and vomiting-secondary to alcoholic pancreatitis Bowel rest, pain management as needed with IV fluids N.p.o.  lipase -[phone] -CT of the abdomen reveals acute pancreatitis with no other complications  2.  Acute on chronic gastritis-continue PPI twice daily for now -Since hemoglobin is stable we will do conservative management -If symptoms do not resolve,, consider GI consult as inpatient  3.  Acute anemia Hemoglobin dropped from 14.4-9.4 We will get stool for occult blood GI consult- Dr. Norma Fredrickson is aware of the consult Continue close monitoring and repeat CBC in a.m.  4.  Acute renal failure with metabolic acidosis-resolved with IV fluids  5.  Leukocytosis-stress reaction.  Resolved  6.   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 -Improving.  Monitor  DVT prophylaxis-Lovenox     All the records are reviewed and case discussed with Care Management/Social Workerr. Management plans discussed with  the patient, she is in agreement CODE STATUS: Full code  TOTAL TIME TAKING CARE OF THIS PATIENT: 36 minutes.   POSSIBLE D/C IN 2 DAYS, DEPENDING ON CLINICAL CONDITION.   Ramonita Lab M.D on 11/24/2018 at 12:56 PM  Between 7am to 6pm - Pager - [phone]  After 6pm go to [email or web address] - password [email or web address]  [facility]  Office  [phone]  CC: Primary care physician; Patient, No Pcp Per

## 2018-11-25 LAB — LIPASE, BLOOD: Lipase: 134 U/L — ABNORMAL HIGH (ref 11–51)

## 2018-11-25 LAB — COMPREHENSIVE METABOLIC PANEL
ALT: 51 U/L — ABNORMAL HIGH (ref 0–44)
AST: 70 U/L — ABNORMAL HIGH (ref 15–41)
Albumin: 2.7 g/dL — ABNORMAL LOW (ref 3.5–5.0)
Alkaline Phosphatase: 51 U/L (ref 38–126)
Anion gap: 9 (ref 5–15)
BUN: 30 mg/dL — ABNORMAL HIGH (ref 6–20)
CO2: 20 mmol/L — AB (ref 22–32)
Calcium: 7.6 mg/dL — ABNORMAL LOW (ref 8.9–10.3)
Chloride: 110 mmol/L (ref 98–111)
Creatinine, Ser: 0.97 mg/dL (ref 0.44–1.00)
GFR calc Af Amer: >60 mL/min (ref >60)
GFR calc non Af Amer: >60 mL/min (ref >60)
GLUCOSE: 42 mg/dL — AB (ref 70–99)
Potassium: 3.1 mmol/L — ABNORMAL LOW (ref 3.5–5.1)
SODIUM: 139 mmol/L (ref 135–145)
Total Bilirubin: 2.6 mg/dL — ABNORMAL HIGH (ref 0.3–1.2)
Total Protein: 4.7 g/dL — ABNORMAL LOW (ref 6.5–8.1)

## 2018-11-25 LAB — GLUCOSE, CAPILLARY
Glucose-Capillary: 153 mg/dL — ABNORMAL HIGH (ref 70–99)
Glucose-Capillary: 116 mg/dL — ABNORMAL HIGH (ref 70–99)
Glucose-Capillary: 157 mg/dL — ABNORMAL HIGH (ref 70–99)

## 2018-11-25 LAB — CBC
HCT: 26.3 % — ABNORMAL LOW (ref 36.0–46.0)
Hemoglobin: 7.9 g/dL — ABNORMAL LOW (ref 12.0–15.0)
MCH: 30.2 pg (ref 26.0–34.0)
MCHC: 30 g/dL (ref 30.0–36.0)
MCV: 100.4 fL — ABNORMAL HIGH (ref 80.0–100.0)
Platelets: 128 10*3/uL — ABNORMAL LOW (ref 150–400)
RBC: 2.62 MIL/uL — ABNORMAL LOW (ref 3.87–5.11)
RDW: 19.1 % — ABNORMAL HIGH (ref 11.5–15.5)
WBC: 8.4 10*3/uL (ref 4.0–10.5)
nRBC: 0 % (ref 0.0–0.2)

## 2018-11-25 MED ORDER — POTASSIUM CHLORIDE CRYS ER 20 MEQ PO TBCR
40.0000 meq | EXTENDED_RELEASE_TABLET | Freq: Once | ORAL | Status: AC
  Administered 2018-11-25: 40 meq via ORAL
  Filled 2018-11-25: qty 2

## 2018-11-25 MED ORDER — DEXTROSE-NACL 5-0.45 % IV SOLN
INTRAVENOUS | Status: DC
  Administered 2018-11-25 – 2018-11-26 (×3): via INTRAVENOUS

## 2018-11-25 MED ORDER — GLUCAGON HCL RDNA (DIAGNOSTIC) 1 MG IJ SOLR
INTRAMUSCULAR | Status: AC
  Filled 2018-11-25: qty 1

## 2018-11-25 MED ORDER — DEXTROSE 50 % IV SOLN: 25.0000 g | INTRAVENOUS | Status: AC

## 2018-11-25 MED ORDER — DEXTROSE 50 % IV SOLN
INTRAVENOUS | Status: AC
  Administered 2018-11-25: 50 mL
  Filled 2018-11-25: qty 50

## 2018-11-25 NOTE — Progress Notes (Signed)
Hypoglycemic Event  CBG: 42, notified by lab  Treatment: dextrose 50% 25g intravenous  Symptoms: dizziness  Follow-up CBG: Time: 0430 CBG Result: 153  Possible Reasons for Event: patient NPO  Comments/MD notified: MD notified and new orders placed    Sandra Pearson

## 2018-11-25 NOTE — Progress Notes (Signed)
[facility]  Subjective: Patient seen for f/u pancreatitis. Patient eating clears this AM without issues. Pain "much better". No bleeding. Patient wants to go home. Currently on menstrual period which has been described as "heavy".  Objective: Vital signs in last 24 hours: Temp:  [97.9 F (36.6 C)-98.2 F (36.8 C)] 97.9 F (36.6 C) (01/30 0823) Pulse Rate:  [87-89] 87 (01/30 0823) Resp:  [18-20] 18 (01/30 0823) BP: (110-130)/(71-90) 130/84 (01/30 0823) SpO2:  [92 %] 92 % (01/30 0013) Blood pressure 130/84, pulse 87, temperature 97.9 F (36.6 C), temperature source Oral, resp. rate 18, height 5\' 9"  (1.753 m), weight 60.8 kg, last menstrual period 11/19/2018, SpO2 92 %, unknown if currently breastfeeding.    Intake/Output from previous day: 01/29 0701 - 01/30 0700 In: 900 [I.V.:900] Out: 1 [Urine:1]  Intake/Output this shift: No intake/output data recorded.   General appearance:  Alert, NAD Resp:  CTA Cardio:  RRR nl S1, S2 GI:  Soft, Minimally tender without rebound, guarding or rigidity. Extremities:  No edema or atrophy.    Lab Results: Results for orders placed or performed during the hospital encounter of 11/22/18 (from the past 24 hour(s))  Hemoglobin and hematocrit, blood     Status: Abnormal   Collection Time: 11/24/18  1:51 PM  Result Value Ref Range   Hemoglobin 8.4 (L) 12.0 - 15.0 g/dL   HCT 40.927.9 (L) 81.136.0 - 91.446.0 %  Hemoglobin and hematocrit, blood     Status: Abnormal   Collection Time: 11/24/18  7:33 PM  Result Value Ref Range   Hemoglobin 8.3 (L) 12.0 - 15.0 g/dL   HCT 78.227.1 (L) 95.636.0 - 21.346.0 %  Comprehensive metabolic panel     Status: Abnormal   Collection Time: 11/25/18  3:04 AM  Result Value Ref Range   Sodium 139 135 - 145 mmol/L   Potassium 3.1 (L) 3.5 - 5.1 mmol/L   Chloride 110 98 - 111 mmol/L   CO2 20 (L) 22 - 32 mmol/L   Glucose, Bld 42 (LL) 70 - 99 mg/dL   BUN 30 (H) 6 - 20 mg/dL   Creatinine, Ser  0.860.97 0.44 - 1.00 mg/dL   Calcium 7.6 (L) 8.9 - 10.3 mg/dL   Total Protein 4.7 (L) 6.5 - 8.1 g/dL   Albumin 2.7 (L) 3.5 - 5.0 g/dL   AST 70 (H) 15 - 41 U/L   ALT 51 (H) 0 - 44 U/L   Alkaline Phosphatase 51 38 - 126 U/L   Total Bilirubin 2.6 (H) 0.3 - 1.2 mg/dL   GFR calc non Af Amer >60 >60 mL/min   GFR calc Af Amer >60 >60 mL/min   Anion gap 9 5 - 15  Lipase, blood     Status: Abnormal   Collection Time: 11/25/18  3:04 AM  Result Value Ref Range   Lipase 134 (H) 11 - 51 U/L  CBC     Status: Abnormal   Collection Time: 11/25/18  3:04 AM  Result Value Ref Range   WBC 8.4 4.0 - 10.5 K/uL   RBC 2.62 (L) 3.87 - 5.11 MIL/uL   Hemoglobin 7.9 (L) 12.0 - 15.0 g/dL   HCT 57.826.3 (L) 46.936.0 - 62.946.0 %   MCV 100.4 (H) 80.0 - 100.0 fL   MCH 30.2 26.0 - 34.0 pg   MCHC 30.0 30.0 - 36.0 g/dL   RDW 52.819.1 (H) 41.311.5 - 24.415.5 %   Platelets 128 (L) 150 - 400 K/uL   nRBC 0.0  0.0 - 0.2 %  Glucose, capillary     Status: Abnormal   Collection Time: 11/25/18  4:33 AM  Result Value Ref Range   Glucose-Capillary 153 (H) 70 - 99 mg/dL  Glucose, capillary     Status: Abnormal   Collection Time: 11/25/18  8:20 AM  Result Value Ref Range   Glucose-Capillary 116 (H) 70 - 99 mg/dL     Recent Labs    47/65/46 0329 11/24/18 1351 11/24/18 1933 11/25/18 0304  WBC 8.1  --   --  8.4  HGB 9.4* 8.4* 8.3* 7.9*  HCT 30.6* 27.9* 27.1* 26.3*  PLT 144*  --   --  128*   BMET Recent Labs    11/23/18 0345 11/24/18 0329 11/25/18 0304  NA 136 137 139  K 3.7 3.6 3.1*  CL 106 103 110  CO2 22 23 20*  GLUCOSE 115* 59* 42*  BUN 13 22* 30*  CREATININE 0.74 0.74 0.97  CALCIUM 8.1* 8.0* 7.6*   LFT Recent Labs    11/25/18 0304  PROT 4.7*  ALBUMIN 2.7*  AST 70*  ALT 51*  ALKPHOS 51  BILITOT 2.6*   PT/INR No results for input(s): LABPROT, INR in the last 72 hours. Hepatitis Panel No results for input(s): HEPBSAG, HCVAB, HEPAIGM, HEPBIGM in the last 72 hours. C-Diff No results for input(s): CDIFFTOX in the last  72 hours. No results for input(s): CDIFFPCR in the last 72 hours.   Studies/Results: Ct Angio Chest Pe W Or Wo Contrast  Result Date: 11/23/2018 CLINICAL DATA:  Shortness of breath, vomiting for 2 days, unable to keep anything down, history hypertension, smoker EXAM: CT ANGIOGRAPHY CHEST CT ABDOMEN AND PELVIS WITH CONTRAST TECHNIQUE: Multidetector CT imaging of the chest was performed using the standard protocol during bolus administration of intravenous contrast. Multiplanar CT image reconstructions and MIPs were obtained to evaluate the vascular anatomy. Multidetector CT imaging of the abdomen and pelvis was performed using the standard protocol during bolus administration of intravenous contrast. CONTRAST:  34mL OMNIPAQUE IOHEXOL 350 MG/ML SOLN IV. Patient unable to tolerate p.o. contrast. COMPARISON:  CT abdomen and pelvis 07/13/2018 FINDINGS: CTA CHEST FINDINGS Cardiovascular: Aorta normal caliber without aneurysm or dissection. No pericardial effusion. Heart normal size. Pulmonary arteries well opacified and patent. No evidence of pulmonary embolism. Mediastinum/Nodes: Esophagus normal appearance. Base of cervical region unremarkable. No thoracic adenopathy. Lungs/Pleura: Lungs clear. No infiltrate, pleural effusion, pneumothorax, or mass. Musculoskeletal: Unremarkable Review of the MIP images confirms the above findings. CT ABDOMEN and PELVIS FINDINGS Hepatobiliary: Diffuse fatty infiltration of liver. Minimal focal sparing adjacent to gallbladder fossa. No additional hepatic abnormalities. Pancreas: Peripancreatic edema extending adjacent to the duodenum, posterior margin of stomach, and in the RIGHT anterior pararenal fascia to the lateral conal fascia consistent with acute pancreatitis. No pancreatic ductal dilatation or obvious mass. Spleen: Normal appearance Adrenals/Urinary Tract: Adrenal glands normal appearance. Dense contrast within renal medullary pyramids. Kidneys, ureters, and bladder  normal appearance Stomach/Bowel: Appendix not visualized but no definite pericecal inflammatory process seen. Stomach and bowel loops unremarkable. Vascular/Lymphatic: Few pelvic phleboliths. Aorta normal caliber. Major vascular structures patent including portal vein, splenic vein, and superior mesenteric vein. Reproductive: Unremarkable uterus and LEFT ovary for age. Decrease in size of RIGHT ovarian bilobed cyst versus adjacent cysts 4.1 x 1.9 cm versus previous 4.5 x 2.4 cm. Other: Minimal free pelvic fluid in cul-de-sac.  No free air. Musculoskeletal: Unremarkable Review of the MIP images confirms the above findings. IMPRESSION: No evidence of pulmonary embolism. Acute  pancreatitis without acute complication. Decreased size of RIGHT ovarian cyst. Electronically Signed   By: Ulyses Southward M.D.   On: 11/23/2018 13:20   Ct Abdomen Pelvis W Contrast  Result Date: 11/23/2018 CLINICAL DATA:  Shortness of breath, vomiting for 2 days, unable to keep anything down, history hypertension, smoker EXAM: CT ANGIOGRAPHY CHEST CT ABDOMEN AND PELVIS WITH CONTRAST TECHNIQUE: Multidetector CT imaging of the chest was performed using the standard protocol during bolus administration of intravenous contrast. Multiplanar CT image reconstructions and MIPs were obtained to evaluate the vascular anatomy. Multidetector CT imaging of the abdomen and pelvis was performed using the standard protocol during bolus administration of intravenous contrast. CONTRAST:  55mL OMNIPAQUE IOHEXOL 350 MG/ML SOLN IV. Patient unable to tolerate p.o. contrast. COMPARISON:  CT abdomen and pelvis 07/13/2018 FINDINGS: CTA CHEST FINDINGS Cardiovascular: Aorta normal caliber without aneurysm or dissection. No pericardial effusion. Heart normal size. Pulmonary arteries well opacified and patent. No evidence of pulmonary embolism. Mediastinum/Nodes: Esophagus normal appearance. Base of cervical region unremarkable. No thoracic adenopathy. Lungs/Pleura: Lungs  clear. No infiltrate, pleural effusion, pneumothorax, or mass. Musculoskeletal: Unremarkable Review of the MIP images confirms the above findings. CT ABDOMEN and PELVIS FINDINGS Hepatobiliary: Diffuse fatty infiltration of liver. Minimal focal sparing adjacent to gallbladder fossa. No additional hepatic abnormalities. Pancreas: Peripancreatic edema extending adjacent to the duodenum, posterior margin of stomach, and in the RIGHT anterior pararenal fascia to the lateral conal fascia consistent with acute pancreatitis. No pancreatic ductal dilatation or obvious mass. Spleen: Normal appearance Adrenals/Urinary Tract: Adrenal glands normal appearance. Dense contrast within renal medullary pyramids. Kidneys, ureters, and bladder normal appearance Stomach/Bowel: Appendix not visualized but no definite pericecal inflammatory process seen. Stomach and bowel loops unremarkable. Vascular/Lymphatic: Few pelvic phleboliths. Aorta normal caliber. Major vascular structures patent including portal vein, splenic vein, and superior mesenteric vein. Reproductive: Unremarkable uterus and LEFT ovary for age. Decrease in size of RIGHT ovarian bilobed cyst versus adjacent cysts 4.1 x 1.9 cm versus previous 4.5 x 2.4 cm. Other: Minimal free pelvic fluid in cul-de-sac.  No free air. Musculoskeletal: Unremarkable Review of the MIP images confirms the above findings. IMPRESSION: No evidence of pulmonary embolism. Acute pancreatitis without acute complication. Decreased size of RIGHT ovarian cyst. Electronically Signed   By: Ulyses Southward M.D.   On: 11/23/2018 13:20    Scheduled Inpatient Medications:   . dextrose  25 g Intravenous STAT  . pantoprazole (PROTONIX) IV  40 mg Intravenous Q12H    Continuous Inpatient Infusions:   . dextrose 5 % and 0.45% NaCl 150 mL/hr at 11/25/18 0435    PRN Inpatient Medications:  acetaminophen **OR** acetaminophen, alum & mag hydroxide-simeth, menthol-cetylpyridinium, ondansetron **OR**  ondansetron (ZOFRAN) IV, oxyCODONE   Assessment:  1. Anemia secondary to blood loss. ?from menstruation. Hemoccult stool results not available. No overt gastrointestinal bleeding. Occult GI bleeding should not cause significant drop of Hgb we have seen over the past few days. 2. Acute pancreatitis - Likely secondary to alcohol abuse. Resolved clinically. WBC is normal.      Plan:  1. Ok to advance diet as tolerated. 2. Taper off narcotics. Patient should stop ALL alcohol. 3. Outpatient EGD and colonoscopy for general workup of anemia. I can see patient in the outpatient setting to arrange. [phone]. 4. Followup in the interim as needed. 5. Ok for discharge from a GI standopint.  Teodoro K. Norma Fredrickson, M.D. 11/25/2018, 12:47 PM

## 2018-11-25 NOTE — Progress Notes (Signed)
[facility] [place] at [facility]   PATIENT NAME: Sandra Pearson    MR#:  [id number]  DATE OF BIRTH:  [birth year]  SUBJECTIVE:  CHIEF COMPLAINT:   Chief Complaint  Patient presents with  . Emesis   Patient is feeling much better today pain in abdomen is 1 out of 10  history of menorrhagia intermittently.  Denies any GI bleed Fianc at bedside  REVIEW OF SYSTEMS:  Review of Systems  Constitutional: Positive for malaise/fatigue. Negative for chills and fever.  HENT: Negative for ear discharge, hearing loss and nosebleeds.   Eyes: Negative for blurred vision and double vision.  Respiratory: Negative for cough, shortness of breath and wheezing.   Cardiovascular: Negative for chest pain and palpitations.  Gastrointestinal: Positive for abdominal pain and nausea. Negative for constipation, diarrhea and vomiting.  Genitourinary: Negative for dysuria.  Musculoskeletal: Negative for myalgias.  Neurological: Negative for dizziness, focal weakness, seizures, weakness and headaches.  Psychiatric/Behavioral: Negative for depression.    DRUG ALLERGIES:  No Known Allergies  VITALS:  Blood pressure 130/84, pulse 87, temperature 97.9 F (36.6 C), temperature source Oral, resp. rate 18, height 5\' 9"  (1.753 m), weight 60.8 kg, last menstrual period 11/19/2018, SpO2 92 %, unknown if currently breastfeeding.  PHYSICAL EXAMINATION:  Physical Exam   GENERAL:  33 y.o.-year-old patient lying in the bed with no acute distress.  EYES: Pupils equal, round, reactive to light and accommodation. No scleral icterus. Extraocular muscles intact.  HEENT: Head atraumatic, normocephalic. Oropharynx and nasopharynx clear.  NECK:  Supple, no jugular venous distention. No thyroid enlargement, no tenderness.  LUNGS: Normal breath sounds bilaterally, no wheezing, rales,rhonchi or crepitation. No use of accessory muscles of respiration.  CARDIOVASCULAR: S1, S2 normal. No murmurs, rubs, or  gallops.  ABDOMEN: Soft, diffusely tender in the upper abdomen without any guarding or rigidity, nondistended. Bowel sounds present.  EXTREMITIES: No pedal edema, cyanosis, or clubbing.  NEUROLOGIC: Awake alert and oriented x3. Sensation intact. Gait not checked.  PSYCHIATRIC: The patient is alert and oriented x 3.  SKIN: No obvious rash, lesion, or ulcer.    LABORATORY PANEL:   CBC Recent Labs  Lab 11/25/18 0304  WBC 8.4  HGB 7.9*  HCT 26.3*  PLT 128*   ------------------------------------------------------------------------------------------------------------------  Chemistries  Recent Labs  Lab 11/25/18 0304  NA 139  K 3.1*  CL 110  CO2 20*  GLUCOSE 42*  BUN 30*  CREATININE 0.97  CALCIUM 7.6*  AST 70*  ALT 51*  ALKPHOS 51  BILITOT 2.6*   ------------------------------------------------------------------------------------------------------------------  Cardiac Enzymes No results for input(s): TROPONINI in the last 168 hours. ------------------------------------------------------------------------------------------------------------------  RADIOLOGY:  No results found.  EKG:   Orders placed or performed during the hospital encounter of 11/22/18  . Repeat EKG  . Repeat EKG    ASSESSMENT AND PLAN:   33 year old female with past medical history significant for gastritis, anemia and hypertension presents to hospital secondary to abdominal pain nausea and vomiting  1.  Acute abdominal pain with nausea and vomiting-secondary to alcoholic pancreatitis Patient is started on clear liquid diet will advance as tolerated to full liquids  lipase [phone] -CT of the abdomen reveals acute pancreatitis with no other complications Strict abstinence from alcohol is recommended.  Outpatient follow-up with AA  2.  Acute on chronic gastritis-continue PPI twice daily for now -Since hemoglobin is stable we will do conservative management -If symptoms do not resolve,,  consider GI consult as inpatient  3.  Acute anemia Hemoglobin dropped  from 14.4-9.4-8.4-7.9  stool for occult blood-ordered GI consult- Dr. Norma Fredrickson has seen the patient and recommending outpatient follow-up with him, planning to do EGD and colonoscopy as an outpatient Continue close monitoring and repeat CBC in a.m.  4.  Acute renal failure with metabolic acidosis-resolved with IV fluids  5.  Leukocytosis-stress reaction.  Resolved  6.   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 -Improving.  Monitor  DVT prophylaxis-Lovenox     All the records are reviewed and case discussed with Care Management/Social Workerr. Management plans discussed with the patient, she is in agreement CODE STATUS: Full code  TOTAL TIME TAKING CARE OF THIS PATIENT: 36 minutes.   POSSIBLE D/C IN 1 DAYS, DEPENDING ON CLINICAL CONDITION.   Ramonita Lab M.D on 11/25/2018 at 2:17 PM  Between 7am to 6pm - Pager - [phone]  After 6pm go to [email or web address] - password [email or web address]  [facility]  Office  [phone]  CC: Primary care physician; Patient, No Pcp Per

## 2018-11-26 LAB — COMPREHENSIVE METABOLIC PANEL
ALBUMIN: 2.5 g/dL — AB (ref 3.5–5.0)
ALT: 86 U/L — ABNORMAL HIGH (ref 0–44)
AST: 132 U/L — AB (ref 15–41)
Alkaline Phosphatase: 69 U/L (ref 38–126)
Anion gap: 6 (ref 5–15)
BUN: 32 mg/dL — ABNORMAL HIGH (ref 6–20)
CO2: 23 mmol/L (ref 22–32)
Calcium: 7.6 mg/dL — ABNORMAL LOW (ref 8.9–10.3)
Chloride: 104 mmol/L (ref 98–111)
Creatinine, Ser: 0.84 mg/dL (ref 0.44–1.00)
GFR calc Af Amer: >60 mL/min (ref >60)
GFR calc non Af Amer: >60 mL/min (ref >60)
GLUCOSE: 150 mg/dL — AB (ref 70–99)
Potassium: 3.1 mmol/L — ABNORMAL LOW (ref 3.5–5.1)
Sodium: 133 mmol/L — ABNORMAL LOW (ref 135–145)
Total Bilirubin: 2.3 mg/dL — ABNORMAL HIGH (ref 0.3–1.2)
Total Protein: 5 g/dL — ABNORMAL LOW (ref 6.5–8.1)

## 2018-11-26 LAB — MAGNESIUM: Magnesium: 2.1 mg/dL (ref 1.7–2.4)

## 2018-11-26 LAB — CBC
HCT: 27.4 % — ABNORMAL LOW (ref 36.0–46.0)
Hemoglobin: 8.6 g/dL — ABNORMAL LOW (ref 12.0–15.0)
MCH: 30.5 pg (ref 26.0–34.0)
MCHC: 31.4 g/dL (ref 30.0–36.0)
MCV: 97.2 fL (ref 80.0–100.0)
Platelets: 111 10*3/uL — ABNORMAL LOW (ref 150–400)
RBC: 2.82 MIL/uL — ABNORMAL LOW (ref 3.87–5.11)
RDW: 18.7 % — ABNORMAL HIGH (ref 11.5–15.5)
WBC: 7 10*3/uL (ref 4.0–10.5)
nRBC: 0 % (ref 0.0–0.2)

## 2018-11-26 LAB — LIPASE, BLOOD: Lipase: 92 U/L — ABNORMAL HIGH (ref 11–51)

## 2018-11-26 MED ORDER — TRAMADOL HCL 50 MG PO TABS: 50.0000 mg | ORAL_TABLET | Freq: Four times a day (QID) | ORAL | Status: DC | PRN

## 2018-11-26 MED ORDER — ALUM & MAG HYDROXIDE-SIMETH 200-200-20 MG/5ML PO SUSP: 30.0000 mL | Freq: Four times a day (QID) | ORAL | 0 refills | Status: DC | PRN

## 2018-11-26 MED ORDER — POTASSIUM CHLORIDE CRYS ER 20 MEQ PO TBCR
40.0000 meq | EXTENDED_RELEASE_TABLET | Freq: Once | ORAL | Status: AC
  Administered 2018-11-26: 40 meq via ORAL
  Filled 2018-11-26: qty 2

## 2018-11-26 MED ORDER — OMEPRAZOLE 20 MG PO CPDR: 20.0000 mg | DELAYED_RELEASE_CAPSULE | Freq: Two times a day (BID) | ORAL | 0 refills | Status: DC

## 2018-11-26 MED ORDER — TRAMADOL HCL 50 MG PO TABS: 50.0000 mg | ORAL_TABLET | Freq: Four times a day (QID) | ORAL | 0 refills | Status: DC | PRN

## 2018-11-26 NOTE — Discharge Summary (Signed)
[facility] [place] at [facility]   PATIENT NAME: Sandra SamplesBrittany Pearson    MR#:  [id number]  DATE OF BIRTH:  [birth year]  DATE OF ADMISSION:  11/22/2018 ADMITTING PHYSICIAN: Enedina FinnerSona Patel, MD  DATE OF DISCHARGE:  11/26/18  PRIMARY CARE PHYSICIAN: Patient, No Pcp Per    ADMISSION DIAGNOSIS:  Vomiting  DISCHARGE DIAGNOSIS:  Active Problems:   Intractable vomiting with nausea Acute alcoholic pancreatitis Acute gastritis Acute anemia  SECONDARY DIAGNOSIS:   Past Medical History:  Diagnosis Date  . Anemia   . Hypertension     HOSPITAL COURSE:   HPI  Sandra Pearson  is a 33 y.o. female with a known history of alcohol abuse, gastritis, chronic anemia comes to the emergency room with history of intractable nausea and vomiting unable to keep anything orally for last couple days. Patient has history of alcohol abuse she reports drinking a glass of wine about a week ago. Patient has history of gastritis and was supposed to see [facility] at [facility] today. She was found to be severely acidotic with anion gap of 32. Patient is being admitted for further evaluation of management.  1.  Acute abdominal pain with nausea and vomiting-secondary to alcoholic pancreatitis Patient clinically improved with aggressive hydration with IV fluids and bowel rest  lipase [phone] Advancing diet as tolerated and discharge home today.  Tolerating soft diet -CT of the abdomen reveals acute pancreatitis with no other complications Strict abstinence from alcohol is recommended.  Outpatient follow-up with AA  2.  Acute on chronic gastritis-continue PPI twice daily for now -Gastroenterology recommended conservative management outpatient follow-up for possible EGD-patient to follow-up with Dr. Norma Fredricksonoledo after discharge  3.  Acute anemia Hemoglobin dropped from 14.4-9.4-8.4-7.9-8.6  stool for occult blood-ordered GI consult- Dr. Norma Fredricksonoledo has seen the patient and recommending  outpatient follow-up with him, planning to do EGD and colonoscopy as an outpatient Continue close monitoring and repeat CBC in a.m.  4.  Acute renal failure with metabolic acidosis-resolved with IV fluids  5.  Leukocytosis-stress reaction.  Resolved  6.   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 -Improving.  Monitor  DVT prophylaxis-Lovenox   DISCHARGE CONDITIONS:   Stable   CONSULTS OBTAINED:  Treatment Team:  Stanton Kidneyoledo, Teodoro K, MD   PROCEDURES  None   DRUG ALLERGIES:  No Known Allergies  DISCHARGE MEDICATIONS:   Allergies as of 11/26/2018   No Known Allergies     Medication List    STOP taking these medications   metoCLOPramide 10 MG tablet Commonly known as:  REGLAN     TAKE these medications   alum & mag hydroxide-simeth 200-200-20 MG/5ML suspension Commonly known as:  MAALOX/MYLANTA Take 30 mLs by mouth every 6 (six) hours as needed for indigestion or heartburn.   famotidine 20 MG tablet Commonly known as:  PEPCID Take 1 tablet (20 mg total) by mouth 2 (two) times daily.   omeprazole 20 MG capsule Commonly known as:  PRILOSEC Take 1 capsule (20 mg total) by mouth 2 (two) times daily before a meal.   traMADol 50 MG tablet Commonly known as:  ULTRAM Take 1 tablet (50 mg total) by mouth every 6 (six) hours as needed for moderate pain.        DISCHARGE INSTRUCTIONS:   Follow-up with primary care physician in 3 days Follow-up with gastroenterology Dr. Norma Fredricksonoledo in 1 to 2 weeks-please repeat LFTs  DIET:  Regular diet  DISCHARGE CONDITION:  Fair  ACTIVITY:  Activity as tolerated  OXYGEN:  Home Oxygen: No.   Oxygen Delivery: room air  DISCHARGE LOCATION:  home   If you experience worsening of your admission symptoms, develop shortness of breath, life threatening emergency, suicidal or homicidal thoughts you must seek medical attention immediately by calling 911 or calling your MD immediately  if symptoms  less severe.  You Must read complete instructions/literature along with all the possible adverse reactions/side effects for all the Medicines you take and that have been prescribed to you. Take any new Medicines after you have completely understood and accpet all the possible adverse reactions/side effects.   Please note  You were cared for by a hospitalist during your hospital stay. If you have any questions about your discharge medications or the care you received while you were in the hospital after you are discharged, you can call the unit and asked to speak with the hospitalist on call if the hospitalist that took care of you is not available. Once you are discharged, your primary care physician will handle any further medical issues. Please note that NO REFILLS for any discharge medications will be authorized once you are discharged, as it is imperative that you return to your primary care physician (or establish a relationship with a primary care physician if you do not have one) for your aftercare needs so that they can reassess your need for medications and monitor your lab values.     Today  Chief Complaint  Patient presents with  . Emesis   Patient is doing fine.  Denies any abdominal pain today.  Tolerating full liquid diet , diet advanced We will discharge patient after lunch   ROS:  CONSTITUTIONAL: Denies fevers, chills. Denies any fatigue, weakness.  EYES: Denies blurry vision, double vision, eye pain. EARS, NOSE, THROAT: Denies tinnitus, ear pain, hearing loss. RESPIRATORY: Denies cough, wheeze, shortness of breath.  CARDIOVASCULAR: Denies chest pain, palpitations, edema.  GASTROINTESTINAL: Denies nausea, vomiting, diarrhea, abdominal pain. Denies bright red blood per rectum. GENITOURINARY: Denies dysuria, hematuria. ENDOCRINE: Denies nocturia or thyroid problems. HEMATOLOGIC AND LYMPHATIC: Denies easy bruising or bleeding. SKIN: Denies rash or lesion. MUSCULOSKELETAL:  Denies pain in neck, back, shoulder, knees, hips or arthritic symptoms.  NEUROLOGIC: Denies paralysis, paresthesias.  PSYCHIATRIC: Denies anxiety or depressive symptoms.   VITAL SIGNS:  Blood pressure (!) 127/91, pulse (!) 115, temperature 97.8 F (36.6 C), temperature source Oral, resp. rate 18, height 5\' 9"  (1.753 m), weight 60.8 kg, last menstrual period 11/19/2018, SpO2 98 %, unknown if currently breastfeeding.  I/O:    Intake/Output Summary (Last 24 hours) at 11/26/2018 1020 Last data filed at 11/26/2018 0200 Gross per 24 hour  Intake 2240 ml  Output -  Net 2240 ml    PHYSICAL EXAMINATION:  GENERAL:  33 y.o.-year-old patient lying in the bed with no acute distress.  EYES: Pupils equal, round, reactive to light and accommodation. No scleral icterus. Extraocular muscles intact.  HEENT: Head atraumatic, normocephalic. Oropharynx and nasopharynx clear.  NECK:  Supple, no jugular venous distention. No thyroid enlargement, no tenderness.  LUNGS: Normal breath sounds bilaterally, no wheezing, rales,rhonchi or crepitation. No use of accessory muscles of respiration.  CARDIOVASCULAR: S1, S2 normal. No murmurs, rubs, or gallops.  ABDOMEN: Soft, non-tender, non-distended. Bowel sounds present. No organomegaly or mass.  EXTREMITIES: No pedal edema, cyanosis, or clubbing.  NEUROLOGIC: Cranial nerves II through XII are intact. Muscle strength 5/5 in all extremities. Sensation intact. Gait not checked.  PSYCHIATRIC: The patient is alert and oriented x 3.  SKIN: No obvious rash, lesion, or ulcer.   DATA REVIEW:   CBC Recent Labs  Lab 11/26/18 0541  WBC 7.0  HGB 8.6*  HCT 27.4*  PLT 111*    Chemistries  Recent Labs  Lab 11/26/18 0541  NA 133*  K 3.1*  CL 104  CO2 23  GLUCOSE 150*  BUN 32*  CREATININE 0.84  CALCIUM 7.6*  MG 2.1  AST 132*  ALT 86*  ALKPHOS 69  BILITOT 2.3*    Cardiac Enzymes No results for input(s): TROPONINI in the last 168 hours.  Microbiology  Results  Results for orders placed or performed during the hospital encounter of 08/28/16  Wet prep, genital     Status: Abnormal   Collection Time: 08/28/16  6:30 PM  Result Value Ref Range Status   Yeast Wet Prep HPF POC NONE SEEN NONE SEEN Final   Trich, Wet Prep NONE SEEN NONE SEEN Final   Clue Cells Wet Prep HPF POC NONE SEEN NONE SEEN Final   WBC, Wet Prep HPF POC FEW (A) NONE SEEN Final   Sperm NONE SEEN  Final    RADIOLOGY:  Ct Angio Chest Pe W Or Wo Contrast  Result Date: 11/23/2018 CLINICAL DATA:  Shortness of breath, vomiting for 2 days, unable to keep anything down, history hypertension, smoker EXAM: CT ANGIOGRAPHY CHEST CT ABDOMEN AND PELVIS WITH CONTRAST TECHNIQUE: Multidetector CT imaging of the chest was performed using the standard protocol during bolus administration of intravenous contrast. Multiplanar CT image reconstructions and MIPs were obtained to evaluate the vascular anatomy. Multidetector CT imaging of the abdomen and pelvis was performed using the standard protocol during bolus administration of intravenous contrast. CONTRAST:  75mL OMNIPAQUE IOHEXOL 350 MG/ML SOLN IV. Patient unable to tolerate p.o. contrast. COMPARISON:  CT abdomen and pelvis 07/13/2018 FINDINGS: CTA CHEST FINDINGS Cardiovascular: Aorta normal caliber without aneurysm or dissection. No pericardial effusion. Heart normal size. Pulmonary arteries well opacified and patent. No evidence of pulmonary embolism. Mediastinum/Nodes: Esophagus normal appearance. Base of cervical region unremarkable. No thoracic adenopathy. Lungs/Pleura: Lungs clear. No infiltrate, pleural effusion, pneumothorax, or mass. Musculoskeletal: Unremarkable Review of the MIP images confirms the above findings. CT ABDOMEN and PELVIS FINDINGS Hepatobiliary: Diffuse fatty infiltration of liver. Minimal focal sparing adjacent to gallbladder fossa. No additional hepatic abnormalities. Pancreas: Peripancreatic edema extending adjacent to the  duodenum, posterior margin of stomach, and in the RIGHT anterior pararenal fascia to the lateral conal fascia consistent with acute pancreatitis. No pancreatic ductal dilatation or obvious mass. Spleen: Normal appearance Adrenals/Urinary Tract: Adrenal glands normal appearance. Dense contrast within renal medullary pyramids. Kidneys, ureters, and bladder normal appearance Stomach/Bowel: Appendix not visualized but no definite pericecal inflammatory process seen. Stomach and bowel loops unremarkable. Vascular/Lymphatic: Few pelvic phleboliths. Aorta normal caliber. Major vascular structures patent including portal vein, splenic vein, and superior mesenteric vein. Reproductive: Unremarkable uterus and LEFT ovary for age. Decrease in size of RIGHT ovarian bilobed cyst versus adjacent cysts 4.1 x 1.9 cm versus previous 4.5 x 2.4 cm. Other: Minimal free pelvic fluid in cul-de-sac.  No free air. Musculoskeletal: Unremarkable Review of the MIP images confirms the above findings. IMPRESSION: No evidence of pulmonary embolism. Acute pancreatitis without acute complication. Decreased size of RIGHT ovarian cyst. Electronically Signed   By: Ulyses SouthwardMark  Boles M.D.   On: 11/23/2018 13:20   Ct Abdomen Pelvis W Contrast  Result Date: 11/23/2018 CLINICAL DATA:  Shortness of breath, vomiting for 2 days, unable to keep anything down, history hypertension, smoker EXAM:  CT ANGIOGRAPHY CHEST CT ABDOMEN AND PELVIS WITH CONTRAST TECHNIQUE: Multidetector CT imaging of the chest was performed using the standard protocol during bolus administration of intravenous contrast. Multiplanar CT image reconstructions and MIPs were obtained to evaluate the vascular anatomy. Multidetector CT imaging of the abdomen and pelvis was performed using the standard protocol during bolus administration of intravenous contrast. CONTRAST:  42mL OMNIPAQUE IOHEXOL 350 MG/ML SOLN IV. Patient unable to tolerate p.o. contrast. COMPARISON:  CT abdomen and pelvis  07/13/2018 FINDINGS: CTA CHEST FINDINGS Cardiovascular: Aorta normal caliber without aneurysm or dissection. No pericardial effusion. Heart normal size. Pulmonary arteries well opacified and patent. No evidence of pulmonary embolism. Mediastinum/Nodes: Esophagus normal appearance. Base of cervical region unremarkable. No thoracic adenopathy. Lungs/Pleura: Lungs clear. No infiltrate, pleural effusion, pneumothorax, or mass. Musculoskeletal: Unremarkable Review of the MIP images confirms the above findings. CT ABDOMEN and PELVIS FINDINGS Hepatobiliary: Diffuse fatty infiltration of liver. Minimal focal sparing adjacent to gallbladder fossa. No additional hepatic abnormalities. Pancreas: Peripancreatic edema extending adjacent to the duodenum, posterior margin of stomach, and in the RIGHT anterior pararenal fascia to the lateral conal fascia consistent with acute pancreatitis. No pancreatic ductal dilatation or obvious mass. Spleen: Normal appearance Adrenals/Urinary Tract: Adrenal glands normal appearance. Dense contrast within renal medullary pyramids. Kidneys, ureters, and bladder normal appearance Stomach/Bowel: Appendix not visualized but no definite pericecal inflammatory process seen. Stomach and bowel loops unremarkable. Vascular/Lymphatic: Few pelvic phleboliths. Aorta normal caliber. Major vascular structures patent including portal vein, splenic vein, and superior mesenteric vein. Reproductive: Unremarkable uterus and LEFT ovary for age. Decrease in size of RIGHT ovarian bilobed cyst versus adjacent cysts 4.1 x 1.9 cm versus previous 4.5 x 2.4 cm. Other: Minimal free pelvic fluid in cul-de-sac.  No free air. Musculoskeletal: Unremarkable Review of the MIP images confirms the above findings. IMPRESSION: No evidence of pulmonary embolism. Acute pancreatitis without acute complication. Decreased size of RIGHT ovarian cyst. Electronically Signed   By: Ulyses Southward M.D.   On: 11/23/2018 13:20    EKG:   Orders  placed or performed during the hospital encounter of 11/22/18  . Repeat EKG  . Repeat EKG      Management plans discussed with the patient, family and they are in agreement.  CODE STATUS:     Code Status Orders  (From admission, onward)         Start     Ordered   11/22/18 1340  Full code  Continuous     11/22/18 1340        Code Status History    This patient has a current code status but no historical code status.    Advance Directive Documentation     Most Recent Value  Type of Advance Directive  Healthcare Power of Attorney, Living will  Pre-existing out of facility DNR order (yellow form or pink MOST form)  -  "MOST" Form in Place?  -      TOTAL TIME TAKING CARE OF THIS PATIENT: 43  minutes.   Note: This dictation was prepared with Dragon dictation along with smaller phrase technology. Any transcriptional errors that result from this process are unintentional.   @MEC @  on 11/26/2018 at 10:20 AM  Between 7am to 6pm - Pager - [phone]  After 6pm go to [email or web address] - password [email or web address]  [facility]  Office  [phone]  CC: Primary care physician; Patient, No Pcp Per

## 2018-11-26 NOTE — Discharge Instructions (Signed)
Follow-up with primary care physician in 3 days Follow-up with gastroenterology Dr. Norma Fredricksonoledo in 1 to 2 weeks-please repeat LFTs

## 2018-11-26 NOTE — Progress Notes (Signed)
Discharge instructions and prescriptions given to patient and fiance with verbal understanding given. Time allowed from questions. Dr. Horace Porteous office will call patient with a follow-up appointment. Patient wheeled out via this Clinical research associate for discharge.

## 2018-12-04 ENCOUNTER — Encounter: Payer: Self-pay | Admitting: Emergency Medicine

## 2018-12-04 ENCOUNTER — Other Ambulatory Visit: Payer: Self-pay

## 2018-12-04 ENCOUNTER — Emergency Department
Admission: EM | Admit: 2018-12-04 | Discharge: 2018-12-04 | Disposition: A | Discharge status: Home / Self Care | Payer: Self-pay | Attending: Emergency Medicine | Admitting: Emergency Medicine

## 2018-12-04 DIAGNOSIS — I10 Essential (primary) hypertension: Secondary | ICD-10-CM | POA: Insufficient documentation

## 2018-12-04 DIAGNOSIS — E876 Hypokalemia: Secondary | ICD-10-CM | POA: Insufficient documentation

## 2018-12-04 DIAGNOSIS — Z79899 Other long term (current) drug therapy: Secondary | ICD-10-CM | POA: Insufficient documentation

## 2018-12-04 DIAGNOSIS — F17210 Nicotine dependence, cigarettes, uncomplicated: Secondary | ICD-10-CM | POA: Insufficient documentation

## 2018-12-04 DIAGNOSIS — R202 Paresthesia of skin: Secondary | ICD-10-CM | POA: Insufficient documentation

## 2018-12-04 LAB — BASIC METABOLIC PANEL
ANION GAP: 14 (ref 5–15)
BUN: 11 mg/dL (ref 6–20)
CO2: 18 mmol/L — ABNORMAL LOW (ref 22–32)
Calcium: 8.7 mg/dL — ABNORMAL LOW (ref 8.9–10.3)
Chloride: 102 mmol/L (ref 98–111)
Creatinine, Ser: 0.54 mg/dL (ref 0.44–1.00)
GFR calc Af Amer: >60 mL/min (ref >60)
GFR calc non Af Amer: >60 mL/min (ref >60)
Glucose, Bld: 132 mg/dL — ABNORMAL HIGH (ref 70–99)
Potassium: 2.9 mmol/L — ABNORMAL LOW (ref 3.5–5.1)
Sodium: 134 mmol/L — ABNORMAL LOW (ref 135–145)

## 2018-12-04 LAB — CBC
HCT: 32.3 % — ABNORMAL LOW (ref 36.0–46.0)
Hemoglobin: 10.6 g/dL — ABNORMAL LOW (ref 12.0–15.0)
MCH: 30.4 pg (ref 26.0–34.0)
MCHC: 32.8 g/dL (ref 30.0–36.0)
MCV: 92.6 fL (ref 80.0–100.0)
PLATELETS: 138 10*3/uL — AB (ref 150–400)
RBC: 3.49 MIL/uL — ABNORMAL LOW (ref 3.87–5.11)
RDW: 19.9 % — ABNORMAL HIGH (ref 11.5–15.5)
WBC: 9.7 10*3/uL (ref 4.0–10.5)
nRBC: 0.2 % (ref 0.0–0.2)

## 2018-12-04 MED ORDER — POTASSIUM CHLORIDE CRYS ER 20 MEQ PO TBCR
40.0000 meq | EXTENDED_RELEASE_TABLET | Freq: Once | ORAL | Status: AC
  Administered 2018-12-04: 40 meq via ORAL
  Filled 2018-12-04: qty 2

## 2018-12-04 MED ORDER — SODIUM CHLORIDE 0.9 % IV BOLUS
1000.0000 mL | Freq: Once | INTRAVENOUS | Status: AC
  Administered 2018-12-04: 1000 mL via INTRAVENOUS

## 2018-12-04 NOTE — Discharge Instructions (Signed)
Please seek medical attention for any high fevers, chest pain, shortness of breath, change in behavior, persistent vomiting, bloody stool or any other new or concerning symptoms.  

## 2018-12-04 NOTE — ED Provider Notes (Signed)
[facility] Emergency Department Provider Note   ____________________________________________   I have reviewed the triage vital signs and the nursing notes.   HISTORY  Chief Complaint Numbness   History limited by: Not Limited   HPI Sandra Pearson is a 33 y.o. female who presents to the emergency department today because of concerns for bilateral lower extremity numbness.  She does describe it more as pins-and-needles.  She states she can feel her feet but the does have the sensation of pins-and-needles.  She feels like this makes it hard for her to walk.  She denies any pain in her legs.  Symptoms have been present for at least 1 week.  They are getting slightly worse.  She did have a recent hospitalization secondary to gastritis.  She does feel like she has been eating okay since then.  She denies any fever.  Denies any chest pain.  Per medical record review patient has a history of anemia, hypertension.  Past Medical History:  Diagnosis Date  . Anemia   . Hypertension     Patient Active Problem List   Diagnosis Date Noted  . Complex ovarian cyst 11/22/2018  . Intractable vomiting with nausea 11/22/2018    Past Surgical History:  Procedure Laterality Date  . MANDIBLE FRACTURE SURGERY      Prior to Admission medications   Medication Sig Start Date End Date Taking? Authorizing Provider  alum & mag hydroxide-simeth (MAALOX/MYLANTA) 200-200-20 MG/5ML suspension Take 30 mLs by mouth every 6 (six) hours as needed for indigestion or heartburn. 11/26/18   Ramonita LabGouru, Aruna, MD  famotidine (PEPCID) 20 MG tablet Take 1 tablet (20 mg total) by mouth 2 (two) times daily. 10/03/18 10/03/19  Myrna BlazerSchaevitz, David Matthew, MD  omeprazole (PRILOSEC) 20 MG capsule Take 1 capsule (20 mg total) by mouth 2 (two) times daily before a meal. 11/26/18 11/26/19  Gouru, Deanna ArtisAruna, MD  traMADol (ULTRAM) 50 MG tablet Take 1 tablet (50 mg total) by mouth every 6 (six) hours as needed for  moderate pain. 11/26/18   Ramonita LabGouru, Aruna, MD    Allergies Patient has no known allergies.  Family History  Problem Relation Age of Onset  . Hepatitis C Mother   . Heart disease Mother     Social History Social History   Tobacco Use  . Smoking status: Current Every Day Smoker    Packs/day: 1.00    Types: Cigarettes  . Smokeless tobacco: Never Used  Substance Use Topics  . Alcohol use: No    Comment: rarely   . Drug use: No    Review of Systems Constitutional: No fever/chills Eyes: No visual changes. ENT: No sore throat. Cardiovascular: Denies chest pain. Respiratory: Denies shortness of breath. Gastrointestinal: No abdominal pain.  No nausea, no vomiting.  No diarrhea.   Genitourinary: Negative for dysuria. Musculoskeletal: Negative for back pain. Skin: Negative for rash. Neurological: Positive for numbness to bilateral legs.   ____________________________________________   PHYSICAL EXAM:  VITAL SIGNS: ED Triage Vitals  Enc Vitals Group     BP 12/04/18 1729 108/73     Pulse Rate 12/04/18 1729 (!) 105     Resp 12/04/18 1729 18     Temp 12/04/18 1729 (!) 97.5 F (36.4 C)     Temp Source 12/04/18 1729 Oral     SpO2 12/04/18 1729 100 %     Weight 12/04/18 1730 134 lb 0.6 oz (60.8 kg)     Height --      Head Circumference --  Peak Flow --      Pain Score 12/04/18 1729 0   Constitutional: Alert and oriented.  Eyes: Conjunctivae are normal.  ENT      Head: Normocephalic and atraumatic.      Nose: No congestion/rhinnorhea.      Mouth/Throat: Mucous membranes are moist.      Neck: No stridor. Hematological/Lymphatic/Immunilogical: No cervical lymphadenopathy. Cardiovascular: Tacycardic, regular rhythm.  No murmurs, rubs, or gallops.  Respiratory: Normal respiratory effort without tachypnea nor retractions. Breath sounds are clear and equal bilaterally. No wheezes/rales/rhonchi. Gastrointestinal: Soft and non tender. No rebound. No guarding.  Genitourinary:  Deferred Musculoskeletal: Normal range of motion in all extremities. No lower extremity edema. Neurologic:  Normal speech and language. No gross focal neurologic deficits are appreciated.  Skin:  Skin is warm, dry and intact. No rash noted. Psychiatric: Mood and affect are normal. Speech and behavior are normal. Patient exhibits appropriate insight and judgment.  ____________________________________________    LABS (pertinent positives/negatives)  CBC wbc 9.7, hgb 10.6, plt 138 BMP na 134, k 2.9, co2 18, glu 132, cr 0.54  ____________________________________________   EKG  I, Phineas SemenGraydon Landyn Buckalew, attending physician, personally viewed and interpreted this EKG  EKG Time: 1743 Rate: 124 Rhythm: sinus tachycardia Axis: normal Intervals: qtc 476 QRS: narrow ST changes: no st elevation Impression: abnormal ekg  ____________________________________________    RADIOLOGY  None  ____________________________________________   PROCEDURES  Procedures  ____________________________________________   INITIAL IMPRESSION / ASSESSMENT AND PLAN / ED COURSE  Pertinent labs & imaging results that were available during my care of the patient were reviewed by me and considered in my medical decision making (see chart for details).   Patient presented to the emergency department today because of concerns for paresthesias.  This is present bilateral legs.  Patient denies any pain in the legs.  On exam patient without any concerning findings.  Patient's blood work showed a mild hypokalemia.  Will plan on repleted here.  Additionally patient was mildly tachycardic.  Do think this could be secondary to dehydration.  Discussed with the patient that she should follow-up with neurology for further evaluation of her paresthesias.  ____________________________________________   FINAL CLINICAL IMPRESSION(S) / ED DIAGNOSES  Final diagnoses:  Paresthesia  Hypokalemia     Note: This dictation  was prepared with Dragon dictation. Any transcriptional errors that result from this process are unintentional     Phineas SemenGoodman, Salimatou Simone, MD 12/04/18 2023

## 2018-12-04 NOTE — ED Triage Notes (Signed)
Pt to ed via ems from home with c/o bilat feet and leg numbness.  Pt states she has felt this way since the last time she was was seen here. Pt states she does not have n/v/d but rather dizziness, lightheadedness and sob intermittently over several weeks.

## 2018-12-19 ENCOUNTER — Emergency Department
Admission: EM | Admit: 2018-12-19 | Discharge: 2018-12-19 | Disposition: A | Discharge status: Home / Self Care | Payer: Self-pay | Attending: Emergency Medicine | Admitting: Emergency Medicine

## 2018-12-19 ENCOUNTER — Other Ambulatory Visit: Payer: Self-pay

## 2018-12-19 DIAGNOSIS — R202 Paresthesia of skin: Secondary | ICD-10-CM

## 2018-12-19 DIAGNOSIS — E538 Deficiency of other specified B group vitamins: Secondary | ICD-10-CM | POA: Insufficient documentation

## 2018-12-19 DIAGNOSIS — I10 Essential (primary) hypertension: Secondary | ICD-10-CM | POA: Insufficient documentation

## 2018-12-19 DIAGNOSIS — F17210 Nicotine dependence, cigarettes, uncomplicated: Secondary | ICD-10-CM | POA: Insufficient documentation

## 2018-12-19 DIAGNOSIS — R209 Unspecified disturbances of skin sensation: Secondary | ICD-10-CM | POA: Insufficient documentation

## 2018-12-19 LAB — COMPREHENSIVE METABOLIC PANEL
ALT: 32 U/L (ref 0–44)
AST: 51 U/L — ABNORMAL HIGH (ref 15–41)
Albumin: 3.5 g/dL (ref 3.5–5.0)
Alkaline Phosphatase: 122 U/L (ref 38–126)
Anion gap: 11 (ref 5–15)
BUN: 5 mg/dL — ABNORMAL LOW (ref 6–20)
CO2: 23 mmol/L (ref 22–32)
Calcium: 8.4 mg/dL — ABNORMAL LOW (ref 8.9–10.3)
Chloride: 106 mmol/L (ref 98–111)
Creatinine, Ser: 0.56 mg/dL (ref 0.44–1.00)
GFR calc Af Amer: >60 mL/min (ref >60)
Glucose, Bld: 134 mg/dL — ABNORMAL HIGH (ref 70–99)
Potassium: 3.7 mmol/L (ref 3.5–5.1)
Sodium: 140 mmol/L (ref 135–145)
Total Bilirubin: 0.9 mg/dL (ref 0.3–1.2)
Total Protein: 7 g/dL (ref 6.5–8.1)

## 2018-12-19 LAB — URINALYSIS, COMPLETE (UACMP) WITH MICROSCOPIC
Bilirubin Urine: NEGATIVE
Glucose, UA: NEGATIVE mg/dL
Hgb urine dipstick: NEGATIVE
Ketones, ur: NEGATIVE mg/dL
Nitrite: NEGATIVE
Protein, ur: NEGATIVE mg/dL
Specific Gravity, Urine: 1.016 (ref 1.005–1.030)
pH: 6 (ref 5.0–8.0)

## 2018-12-19 LAB — CBC
HCT: 36 % (ref 36.0–46.0)
Hemoglobin: 11.4 g/dL — ABNORMAL LOW (ref 12.0–15.0)
MCH: 30 pg (ref 26.0–34.0)
MCHC: 31.7 g/dL (ref 30.0–36.0)
MCV: 94.7 fL (ref 80.0–100.0)
Platelets: 532 10*3/uL — ABNORMAL HIGH (ref 150–400)
RBC: 3.8 MIL/uL — ABNORMAL LOW (ref 3.87–5.11)
RDW: 19.4 % — AB (ref 11.5–15.5)
WBC: 6.4 10*3/uL (ref 4.0–10.5)
nRBC: 0 % (ref 0.0–0.2)

## 2018-12-19 LAB — URINE DRUG SCREEN, QUALITATIVE (ARMC ONLY)
Amphetamines, Ur Screen: NOT DETECTED
Barbiturates, Ur Screen: NOT DETECTED
Benzodiazepine, Ur Scrn: NOT DETECTED
Cannabinoid 50 Ng, Ur ~~LOC~~: NOT DETECTED
Cocaine Metabolite,Ur ~~LOC~~: NOT DETECTED
MDMA (Ecstasy)Ur Screen: NOT DETECTED
METHADONE SCREEN, URINE: NOT DETECTED
Opiate, Ur Screen: POSITIVE — AB
Phencyclidine (PCP) Ur S: NOT DETECTED
Tricyclic, Ur Screen: NOT DETECTED

## 2018-12-19 LAB — T4, FREE: Free T4: 0.93 ng/dL (ref 0.82–1.77)

## 2018-12-19 LAB — POCT PREGNANCY, URINE: PREG TEST UR: NEGATIVE

## 2018-12-19 LAB — TSH: TSH: 0.826 u[IU]/mL (ref 0.350–4.500)

## 2018-12-19 LAB — FOLATE: Folate: 3.8 ng/mL — ABNORMAL LOW (ref >5.9)

## 2018-12-19 MED ORDER — FOLIC ACID 1 MG PO TABS
1.0000 mg | ORAL_TABLET | Freq: Once | ORAL | Status: AC
  Administered 2018-12-19: 1 mg via ORAL
  Filled 2018-12-19: qty 1

## 2018-12-19 MED ORDER — GABAPENTIN 100 MG PO CAPS: 100.0000 mg | ORAL_CAPSULE | Freq: Three times a day (TID) | ORAL | 0 refills | Status: DC | PRN

## 2018-12-19 MED ORDER — GABAPENTIN 100 MG PO CAPS
100.0000 mg | ORAL_CAPSULE | ORAL | Status: AC
  Administered 2018-12-19: 100 mg via ORAL
  Filled 2018-12-19: qty 1

## 2018-12-19 MED ORDER — SODIUM CHLORIDE 0.9 % IV BOLUS
1000.0000 mL | Freq: Once | INTRAVENOUS | Status: AC
  Administered 2018-12-19: 1000 mL via INTRAVENOUS

## 2018-12-19 MED ORDER — VITAMIN B-1 100 MG PO TABS
100.0000 mg | ORAL_TABLET | Freq: Once | ORAL | Status: AC
  Administered 2018-12-19: 100 mg via ORAL
  Filled 2018-12-19: qty 1

## 2018-12-19 NOTE — ED Notes (Signed)
Light green tube and SST tube sent to lab at this time.

## 2018-12-19 NOTE — ED Notes (Signed)
Patient ambulated to room commode with family assist.

## 2018-12-19 NOTE — ED Triage Notes (Signed)
Pt states bilat feet numbness up to knees, L arm, and mouth numbness. X 5 days. Denies hx of DM. A&O, ambulatory. Moving all parts of body that pt states are numb.

## 2018-12-19 NOTE — ED Provider Notes (Signed)
[facility] Emergency Department Provider Note   ____________________________________________   First MD Initiated Contact with Patient 12/19/18 1745     (approximate)  I have reviewed the triage vital signs and the nursing notes.   HISTORY  Chief Complaint Numbness    HPI Sandra Pearson is a 33 y.o. female here for evaluation of tingling and numbness  Patient reports now for about a month she has been experiencing a tingling feeling a burning feeling that comes into her feet legs and some into the tips of her fingers.  She been experiencing this, it seems to be ongoing.  Not associated fevers or chills.  There is no weakness in the muscles.  Some slight tremulousness in her hands from time to time.  No headache.  No confusion.  Her fianc is with her reports no confusion.  She does report she is to have a history of fairly heavy alcohol use, but has since quit use of alcohol for a month.  She is followed up with gastroenterology and she is started taking a folate acid supplement prescription that was given to her.  She is not sure of the cause, but reports the symptoms of stinging pain especially in her feet and lower legs seem to just be still present and extremely bothersome.  Much worse when she tries to walk up on her feet.     Past Medical History:  Diagnosis Date  . Anemia   . Hypertension     Patient Active Problem List   Diagnosis Date Noted  . Complex ovarian cyst 11/22/2018  . Intractable vomiting with nausea 11/22/2018    Past Surgical History:  Procedure Laterality Date  . MANDIBLE FRACTURE SURGERY      Prior to Admission medications   Medication Sig Start Date End Date Taking? Authorizing Provider  famotidine (PEPCID) 20 MG tablet Take 1 tablet (20 mg total) by mouth 2 (two) times daily. 10/03/18 10/03/19 Yes Schaevitz, Myra Rude, MD  omeprazole (PRILOSEC) 20 MG capsule Take 1 capsule (20 mg total) by mouth 2 (two) times daily  before a meal. 11/26/18 11/26/19 Yes Gouru, Aruna, MD  ondansetron (ZOFRAN) 4 MG tablet Take 4 mg by mouth every 8 (eight) hours as needed for nausea or vomiting.   Yes [provider]  alum & mag hydroxide-simeth (MAALOX/MYLANTA) 200-200-20 MG/5ML suspension Take 30 mLs by mouth every 6 (six) hours as needed for indigestion or heartburn. 11/26/18   Ramonita Lab, MD  folic acid (FOLVITE) 1 MG tablet Take 1 mg by mouth daily.    [provider]  gabapentin (NEURONTIN) 100 MG capsule Take 1 capsule (100 mg total) by mouth 3 (three) times daily as needed. 12/19/18 12/19/19  Sharyn Creamer, MD  traMADol (ULTRAM) 50 MG tablet Take 1 tablet (50 mg total) by mouth every 6 (six) hours as needed for moderate pain. 11/26/18   Ramonita Lab, MD    Allergies Patient has no known allergies.  Family History  Problem Relation Age of Onset  . Hepatitis C Mother   . Heart disease Mother     Social History Social History   Tobacco Use  . Smoking status: Current Every Day Smoker    Packs/day: 1.00    Types: Cigarettes  . Smokeless tobacco: Never Used  Substance Use Topics  . Alcohol use: No    Comment: rarely   . Drug use: No    Review of Systems Constitutional: No fever/chills Eyes: No visual changes. ENT: No sore throat.  Cardiovascular: Denies chest pain. Respiratory: Denies shortness of breath. Gastrointestinal: No abdominal pain.  Denies pregnancy.  No heavy bleeding.  Normal menstrual cycle earlier this month was normal.  Is not breast-feeding. Genitourinary: Negative for dysuria. Musculoskeletal: Negative for back pain. Skin: Negative for rash. Neurological: Negative for headaches or weakness.  Please see HPI.    ____________________________________________   PHYSICAL EXAM:  VITAL SIGNS: ED Triage Vitals  Enc Vitals Group     BP 12/19/18 1438 95/77     Pulse Rate 12/19/18 1438 (!) 142     Resp 12/19/18 1438 18     Temp 12/19/18 1438 98.2 F (36.8 C)     Temp  Source 12/19/18 1438 Oral     SpO2 12/19/18 1438 100 %     Weight 12/19/18 1439 134 lb (60.8 kg)     Height 12/19/18 1439  (1.753 m)     Head Circumference --      Peak Flow --      Pain Score 12/19/18 1438 8     Pain Loc --      Pain Edu? --      Excl. in GC? --     Constitutional: Alert and oriented. Well appearing and in no acute distress. Eyes: Conjunctivae are normal. Head: Atraumatic. Nose: No congestion/rhinnorhea. Mouth/Throat: Mucous membranes are moist. Neck: No stridor.  Cardiovascular: Slightly tachycardic rate of about 110, regular rhythm. Grossly normal heart sounds.  Good peripheral circulation. Respiratory: Normal respiratory effort.  No retractions. Lungs CTAB. Gastrointestinal: Soft and nontender. No distention. Musculoskeletal: No lower extremity tenderness nor edema. Neurologic:  Normal speech and language. No gross focal neurologic deficits are appreciated.  She has 5 out of 5 strength in all extremities.  She has a slight tremulousness in her upper extremities, it is very minimal.  She reports normal sensation except when touching her feet or her lower legs she reports that induces a burning stinging sharp feeling.  There is no loss of sensation.  She has normal strength in her feet, dorsi and plantar flexion, her hip flexors and extensors, and her reflexes are normal in her patellar bilateral.  There is no inducible myoclonus. Skin:  Skin is warm, dry and intact. No rash noted. Psychiatric: Mood and affect are normal. Speech and behavior are normal.  ____________________________________________   LABS (all labs ordered are listed, but only abnormal results are displayed)  Labs Reviewed  CBC - Abnormal; Notable for the following components:      Result Value   RBC 3.80 (*)    Hemoglobin 11.4 (*)    RDW 19.4 (*)    Platelets 532 (*)    All other components within normal limits  COMPREHENSIVE METABOLIC PANEL - Abnormal; Notable for the following  components:   Glucose, Bld 134 (*)    BUN 5 (*)    Calcium 8.4 (*)    AST 51 (*)    All other components within normal limits  FOLATE - Abnormal; Notable for the following components:   Folate 3.8 (*)    All other components within normal limits  URINALYSIS, COMPLETE (UACMP) WITH MICROSCOPIC - Abnormal; Notable for the following components:   Color, Urine YELLOW (*)    APPearance HAZY (*)    Leukocytes,Ua TRACE (*)    Bacteria, UA RARE (*)    All other components within normal limits  TSH  T4, FREE  VITAMIN B12  URINE DRUG SCREEN, QUALITATIVE ([facility] ONLY)  POC URINE PREG, ED  POCT PREGNANCY,  URINE  CBG MONITORING, ED   ____________________________________________  EKG  Reviewed by me at 1445 Heart rate 130 QRS 89 QTc 440 Sinus tachycardia, no evidence acute ischemia ____________________________________________  RADIOLOGY  No results found.  Noted etiology to suggest the need for central imaging at this time. ____________________________________________   PROCEDURES  Procedure(s) performed: None  Procedures  Critical Care performed: No  ____________________________________________   INITIAL IMPRESSION / ASSESSMENT AND PLAN / ED COURSE  Pertinent labs & imaging results that were available during my care of the patient were reviewed by me and considered in my medical decision making (see chart for details).   Patient reports ongoing symptoms of paresthesia, her symptoms seem to develop some type of a neuropathy or neuropathic pain.  Presentation does not appear to be consistent with an acute demyelinating process or Guyon Barr type presentation.  However, in consideration of her history I strongly suspect this may be related to a vitamin deficiency, possible alcoholic neuropathy, or other somewhat chronic causation.  She is tachycardic and I suspect she may be been slightly dehydrated but after a liter of fluid her blood pressure improved and her heart rate  improved and orthostatics improved.  She feels well.  Presentation appears somewhat similar to that of beginning of February when she came to the ED as well  Discussed with patient we will trial gabapentin, and I have given her follow-up for which she is plans to already established follow-up with neurology note she is also follow-up with GI.  Return precautions and treatment recommendations and follow-up discussed with the patient who is agreeable with the plan.        ____________________________________________   FINAL CLINICAL IMPRESSION(S) / ED DIAGNOSES  Final diagnoses:  Paresthesias  Folate deficiency        Note:  This document was prepared using Dragon voice recognition software and may include unintentional dictation errors       Sharyn Creamer, MD 12/19/18 2111

## 2018-12-19 NOTE — ED Notes (Signed)
Bilateral grips are equal and strong. Patient c/o numbness in left hand. Patient c/o pain in bilateral feet and numbness in lips. Color is good, skin is warm and dry. Patient is alert and oriented x4, moves all extremities equally

## 2018-12-19 NOTE — ED Notes (Signed)
Feet are warm, good color. Bilateral pedal pulses are palpable. Left great toenail is discolored, dried skin/blisters noted on left great toe.

## 2018-12-20 LAB — VITAMIN B12: Vitamin B-12: 455 pg/mL (ref 180–914)

## 2019-02-18 ENCOUNTER — Other Ambulatory Visit: Payer: Self-pay

## 2019-02-18 ENCOUNTER — Emergency Department
Admission: EM | Admit: 2019-02-18 | Discharge: 2019-02-18 | Disposition: A | Discharge status: Home / Self Care | Payer: Self-pay | Attending: Emergency Medicine | Admitting: Emergency Medicine

## 2019-02-18 DIAGNOSIS — F17210 Nicotine dependence, cigarettes, uncomplicated: Secondary | ICD-10-CM | POA: Insufficient documentation

## 2019-02-18 DIAGNOSIS — I10 Essential (primary) hypertension: Secondary | ICD-10-CM | POA: Insufficient documentation

## 2019-02-18 DIAGNOSIS — R202 Paresthesia of skin: Secondary | ICD-10-CM | POA: Insufficient documentation

## 2019-02-18 DIAGNOSIS — Z79899 Other long term (current) drug therapy: Secondary | ICD-10-CM | POA: Insufficient documentation

## 2019-02-18 MED ORDER — ONDANSETRON HCL 4 MG PO TABS: 4.0000 mg | ORAL_TABLET | Freq: Three times a day (TID) | ORAL | 1 refills | Status: DC | PRN

## 2019-02-18 MED ORDER — FOLIC ACID 1 MG PO TABS: 1.0000 mg | ORAL_TABLET | Freq: Every day | ORAL | 2 refills | Status: DC

## 2019-02-18 MED ORDER — TRAMADOL HCL 50 MG PO TABS: 50.0000 mg | ORAL_TABLET | Freq: Four times a day (QID) | ORAL | 0 refills | Status: DC | PRN

## 2019-02-18 MED ORDER — GABAPENTIN 100 MG PO CAPS: 100.0000 mg | ORAL_CAPSULE | Freq: Three times a day (TID) | ORAL | 3 refills | Status: DC | PRN

## 2019-02-18 NOTE — Discharge Instructions (Signed)
Please reschedule your appointment with neurology.  Return to the ER for symptoms that change or worsen if unable to schedule an appointment.

## 2019-02-18 NOTE — ED Notes (Signed)
See triage note.  Presents with pain to soles of both feet  Describes pain as burning   Was on gabapentin but is out of meds

## 2019-02-18 NOTE — ED Triage Notes (Signed)
Pt c/o BL foot pain for the past 2 months, states she was seen here and referred to neurologist but there office is closed due to the virus., states she has loss of appetite due to the pain

## 2019-02-19 NOTE — ED Provider Notes (Signed)
[facility] Emergency Department Provider Note ____________________________________________  Time seen: Approximately 5:21 PM  I have reviewed the triage vital signs and the nursing notes.   HISTORY  Chief Complaint No chief complaint on file.    HPI Sandra Pearson is a 33 y.o. female who presents to the emergency department for evaluation and treatment of bilateral foot pain. She has a history of neuropathy. At her last visit here, she was given prescriptions for gabapentin and tramadol which worked well. She had a follow up appointment with neurology, but due to pandemic it was cancelled. Patient requests refill of medications. No new or concerning symptoms.  Past Medical History:  Diagnosis Date  . Anemia   . Hypertension     Patient Active Problem List   Diagnosis Date Noted  . Complex ovarian cyst 11/22/2018  . Intractable vomiting with nausea 11/22/2018    Past Surgical History:  Procedure Laterality Date  . MANDIBLE FRACTURE SURGERY      Prior to Admission medications   Medication Sig Start Date End Date Taking? Authorizing Provider  folic acid (FOLVITE) 1 MG tablet Take 1 tablet (1 mg total) by mouth daily. 02/18/19   Paraskevi Funez, Rulon Eisenmengerari B, FNP  gabapentin (NEURONTIN) 100 MG capsule Take 1 capsule (100 mg total) by mouth 3 (three) times daily as needed. 02/18/19 05/19/19  Dalvin Clipper, Kasandra Knudsenari B, FNP  omeprazole (PRILOSEC) 20 MG capsule Take 1 capsule (20 mg total) by mouth 2 (two) times daily before a meal. 11/26/18 11/26/19  Gouru, Deanna ArtisAruna, MD  ondansetron (ZOFRAN) 4 MG tablet Take 1 tablet (4 mg total) by mouth every 8 (eight) hours as needed for nausea or vomiting. 02/18/19   Race Latour B, FNP  traMADol (ULTRAM) 50 MG tablet Take 1 tablet (50 mg total) by mouth every 6 (six) hours as needed. 02/18/19   Chinita Pesterriplett, Jessilynn Taft B, FNP    Allergies Patient has no known allergies.  Family History  Problem Relation Age of Onset  . Hepatitis C Mother   . Heart  disease Mother     Social History Social History   Tobacco Use  . Smoking status: Current Every Day Smoker    Packs/day: 1.00    Types: Cigarettes  . Smokeless tobacco: Never Used  Substance Use Topics  . Alcohol use: No    Comment: rarely   . Drug use: No    Review of Systems Constitutional: Negative for fever. Cardiovascular: Negative for chest pain. Respiratory: Negative for shortness of breath. Musculoskeletal: Negative for joint effusion. Skin: Negative for open wound or lesion.  Neurological: Positive for decrease in sensation  ____________________________________________   PHYSICAL EXAM:  VITAL SIGNS: ED Triage Vitals  Enc Vitals Group     BP 02/18/19 1258 (!) 152/100     Pulse Rate 02/18/19 1258 99     Resp 02/18/19 1258 18     Temp 02/18/19 1258 97.7 F (36.5 C)     Temp Source 02/18/19 1258 Oral     SpO2 02/18/19 1258 99 %     Weight 02/18/19 1253 136 lb (61.7 kg)     Height 02/18/19 1253 5\' 9"  (1.753 m)     Head Circumference --      Peak Flow --      Pain Score 02/18/19 1253 7     Pain Loc --      Pain Edu? --      Excl. in GC? --     Constitutional: Alert and oriented. Well appearing and in  no acute distress. Eyes: Conjunctivae are clear without discharge or drainage Head: Atraumatic Neck: Supple  Respiratory: No cough. Respirations are even and unlabored. Musculoskeletal: FROM of bilateral lower extremities. Neurologic: Differentiates between sharp and dull in lower extremities. Motor function intact.  Skin: No open wounds or lesions.  Psychiatric: Affect and behavior are appropriate.  ____________________________________________   LABS (all labs ordered are listed, but only abnormal results are displayed)  Labs Reviewed - No data to display ____________________________________________  RADIOLOGY  Not  indicated. ____________________________________________   PROCEDURES  Procedures  ____________________________________________   INITIAL IMPRESSION / ASSESSMENT AND PLAN / ED COURSE  Sandra Pearson is a 33 y.o. who presents to the emergency department for treatment and evaluation of bilateral foot pain. In review of last visit related to paresthesias, it appears tht her folate was low and she was drinking alcohol routinely. She states that she no longer drinks routinely. Prescriptions for folic acid, gabapentin and tramadol refilled as requested. She is to follow up with neurology when able to be rescheduled. She is to return to the ER for symptoms that change or worsen if unable to schedule an appointment.  Medications - No data to display  Pertinent labs & imaging results that were available during my care of the patient were reviewed by me and considered in my medical decision making (see chart for details).  _________________________________________   FINAL CLINICAL IMPRESSION(S) / ED DIAGNOSES  Final diagnoses:  Paresthesias    ED Discharge Orders         Ordered    folic acid (FOLVITE) 1 MG tablet  Daily     02/18/19 1345    gabapentin (NEURONTIN) 100 MG capsule  3 times daily PRN     02/18/19 1345    ondansetron (ZOFRAN) 4 MG tablet  Every 8 hours PRN     02/18/19 1345    traMADol (ULTRAM) 50 MG tablet  Every 6 hours PRN     02/18/19 1345           If controlled substance prescribed during this visit, 12 month history viewed on the [facility] prior to issuing an initial prescription for Schedule II or III opiod.   Chinita Pester, FNP 02/19/19 1730    Nita Sickle, MD 02/20/19 [phone]

## 2019-03-05 ENCOUNTER — Other Ambulatory Visit: Payer: Self-pay

## 2019-03-05 ENCOUNTER — Encounter: Payer: Self-pay | Admitting: Emergency Medicine

## 2019-03-05 DIAGNOSIS — F17210 Nicotine dependence, cigarettes, uncomplicated: Secondary | ICD-10-CM | POA: Insufficient documentation

## 2019-03-05 DIAGNOSIS — R112 Nausea with vomiting, unspecified: Secondary | ICD-10-CM | POA: Insufficient documentation

## 2019-03-05 DIAGNOSIS — Z79899 Other long term (current) drug therapy: Secondary | ICD-10-CM | POA: Insufficient documentation

## 2019-03-05 DIAGNOSIS — I10 Essential (primary) hypertension: Secondary | ICD-10-CM | POA: Insufficient documentation

## 2019-03-05 LAB — COMPREHENSIVE METABOLIC PANEL
ALT: 44 U/L (ref 0–44)
AST: 71 U/L — ABNORMAL HIGH (ref 15–41)
Albumin: 3.6 g/dL (ref 3.5–5.0)
Alkaline Phosphatase: 132 U/L — ABNORMAL HIGH (ref 38–126)
Anion gap: 18 — ABNORMAL HIGH (ref 5–15)
BUN: 10 mg/dL (ref 6–20)
CO2: 20 mmol/L — ABNORMAL LOW (ref 22–32)
Calcium: 9.4 mg/dL (ref 8.9–10.3)
Chloride: 96 mmol/L — ABNORMAL LOW (ref 98–111)
Creatinine, Ser: 0.56 mg/dL (ref 0.44–1.00)
GFR calc Af Amer: >60 mL/min (ref >60)
GFR calc non Af Amer: >60 mL/min (ref >60)
Glucose, Bld: 111 mg/dL — ABNORMAL HIGH (ref 70–99)
Potassium: 3.4 mmol/L — ABNORMAL LOW (ref 3.5–5.1)
Sodium: 134 mmol/L — ABNORMAL LOW (ref 135–145)
Total Bilirubin: 1.3 mg/dL — ABNORMAL HIGH (ref 0.3–1.2)
Total Protein: 7.4 g/dL (ref 6.5–8.1)

## 2019-03-05 LAB — CBC
HCT: 47.3 % — ABNORMAL HIGH (ref 36.0–46.0)
Hemoglobin: 15.5 g/dL — ABNORMAL HIGH (ref 12.0–15.0)
MCH: 31.3 pg (ref 26.0–34.0)
MCHC: 32.8 g/dL (ref 30.0–36.0)
MCV: 95.6 fL (ref 80.0–100.0)
Platelets: 303 10*3/uL (ref 150–400)
RBC: 4.95 MIL/uL (ref 3.87–5.11)
RDW: 16.7 % — ABNORMAL HIGH (ref 11.5–15.5)
WBC: 6.4 10*3/uL (ref 4.0–10.5)
nRBC: 0 % (ref 0.0–0.2)

## 2019-03-05 LAB — LIPASE, BLOOD: Lipase: 26 U/L (ref 11–51)

## 2019-03-05 MED ORDER — SODIUM CHLORIDE 0.9 % IV BOLUS
1000.0000 mL | Freq: Once | INTRAVENOUS | Status: AC
  Administered 2019-03-05: 1000 mL via INTRAVENOUS

## 2019-03-05 NOTE — ED Notes (Signed)
Spoke to [employer] MD about pt presentation. Order received for 1L NS bolus.

## 2019-03-05 NOTE — ED Triage Notes (Signed)
Pt presents to ED c/o emesis and dry heaves for several days. Denies diarrhea or abd pain. States she has not been able to hold down even fluids for 2-3 days. P150s in triage.

## 2019-03-06 ENCOUNTER — Emergency Department
Admission: EM | Admit: 2019-03-06 | Discharge: 2019-03-06 | Disposition: A | Discharge status: Home / Self Care | Payer: Self-pay | Attending: Emergency Medicine | Admitting: Emergency Medicine

## 2019-03-06 DIAGNOSIS — R112 Nausea with vomiting, unspecified: Secondary | ICD-10-CM

## 2019-03-06 LAB — URINALYSIS, COMPLETE (UACMP) WITH MICROSCOPIC
Bilirubin Urine: NEGATIVE
Glucose, UA: NEGATIVE mg/dL
Hgb urine dipstick: NEGATIVE
Ketones, ur: 20 mg/dL — AB
Leukocytes,Ua: NEGATIVE
Nitrite: NEGATIVE
Protein, ur: NEGATIVE mg/dL
Specific Gravity, Urine: 1.018 (ref 1.005–1.030)
pH: 5 (ref 5.0–8.0)

## 2019-03-06 LAB — PREGNANCY, URINE: Preg Test, Ur: NEGATIVE

## 2019-03-06 MED ORDER — SODIUM CHLORIDE 0.9 % IV BOLUS
1000.0000 mL | Freq: Once | INTRAVENOUS | Status: AC
  Administered 2019-03-06: 1000 mL via INTRAVENOUS

## 2019-03-06 MED ORDER — PROMETHAZINE HCL 25 MG PO TABS: 25.0000 mg | ORAL_TABLET | Freq: Four times a day (QID) | ORAL | 0 refills | Status: DC | PRN

## 2019-03-06 MED ORDER — LIDOCAINE VISCOUS HCL 2 % MT SOLN
15.0000 mL | Freq: Once | OROMUCOSAL | Status: AC
  Administered 2019-03-06: 15 mL via ORAL
  Filled 2019-03-06: qty 15

## 2019-03-06 MED ORDER — PROMETHAZINE HCL 25 MG/ML IJ SOLN
12.5000 mg | Freq: Once | INTRAMUSCULAR | Status: AC
  Administered 2019-03-06: 12.5 mg via INTRAVENOUS
  Filled 2019-03-06: qty 1

## 2019-03-06 MED ORDER — ALUM & MAG HYDROXIDE-SIMETH 200-200-20 MG/5ML PO SUSP
30.0000 mL | Freq: Once | ORAL | Status: AC
  Administered 2019-03-06: 30 mL via ORAL
  Filled 2019-03-06: qty 30

## 2019-03-06 NOTE — ED Notes (Signed)
Patient given update on wait, verbalized understanding.

## 2019-03-06 NOTE — ED Notes (Signed)
POC PREG NEG 

## 2019-03-06 NOTE — ED Provider Notes (Signed)
[facility] Emergency Department Provider Note  Time seen: 1:36 AM  I have reviewed the triage vital signs and the nursing notes.   HISTORY  Chief Complaint Emesis   HPI Sandra Pearson is a 33 y.o. female with a past medical history of hypertension, anemia, presents to the emergency department for nausea and vomiting.  Patient states a history of intractable nausea vomiting in the past, states her last episode was approximately 3 months ago.  Patient has appointments with neurology as well as GI medicine regarding this, but states due to the coronavirus she has not been able to see either specialist.  Patient states she ran out of her Zofran at home.  For the last 3 days has been nauseated with vomiting.  Over the past 24 hours has not been able to keep down any water.  Patient denies any pain.  Denies any abdominal pain or chest pain.  Does state a mild indigestion sensation which is fairly chronic for her as well.  Denies any fever cough congestion shortness of breath.   Past Medical History:  Diagnosis Date  . Anemia   . Hypertension     Patient Active Problem List   Diagnosis Date Noted  . Complex ovarian cyst 11/22/2018  . Intractable vomiting with nausea 11/22/2018    Past Surgical History:  Procedure Laterality Date  . MANDIBLE FRACTURE SURGERY      Prior to Admission medications   Medication Sig Start Date End Date Taking? Authorizing Provider  folic acid (FOLVITE) 1 MG tablet Take 1 tablet (1 mg total) by mouth daily. 02/18/19   Triplett, Rulon Eisenmengerari B, FNP  gabapentin (NEURONTIN) 100 MG capsule Take 1 capsule (100 mg total) by mouth 3 (three) times daily as needed. 02/18/19 05/19/19  Triplett, Kasandra Knudsenari B, FNP  omeprazole (PRILOSEC) 20 MG capsule Take 1 capsule (20 mg total) by mouth 2 (two) times daily before a meal. 11/26/18 11/26/19  Gouru, Deanna ArtisAruna, MD  ondansetron (ZOFRAN) 4 MG tablet Take 1 tablet (4 mg total) by mouth every 8 (eight) hours as needed for  nausea or vomiting. 02/18/19   Triplett, Cari B, FNP  traMADol (ULTRAM) 50 MG tablet Take 1 tablet (50 mg total) by mouth every 6 (six) hours as needed. 02/18/19   Triplett, Rulon Eisenmengerari B, FNP    No Known Allergies  Family History  Problem Relation Age of Onset  . Hepatitis C Mother   . Heart disease Mother     Social History Social History   Tobacco Use  . Smoking status: Current Every Day Smoker    Packs/day: 1.00    Types: Cigarettes  . Smokeless tobacco: Never Used  Substance Use Topics  . Alcohol use: No    Comment: rarely   . Drug use: No    Review of Systems Constitutional: Negative for fever. ENT: Negative for recent illness/congestion Cardiovascular: Negative for chest pain. Respiratory: Negative for shortness of breath.  Negative for cough. Gastrointestinal: Negative for abdominal pain.  Positive for vomiting.  No diarrhea. Genitourinary: Negative for urinary compaints Musculoskeletal: Negative for musculoskeletal complaints Skin: Negative for skin complaints  Neurological: Negative for headache All other ROS negative  ____________________________________________   PHYSICAL EXAM:  VITAL SIGNS: ED Triage Vitals  Enc Vitals Group     BP 03/05/19 1829 115/90     Pulse Rate 03/05/19 1829 (!) 158     Resp 03/05/19 1829 20     Temp 03/05/19 1829 98.3 F (36.8 C)     Temp  Source 03/05/19 1829 Oral     SpO2 03/05/19 1829 100 %     Weight 03/05/19 1830 136 lb (61.7 kg)     Height 03/05/19 1830 5\' 9"  (1.753 m)     Head Circumference --      Peak Flow --      Pain Score 03/05/19 1830 0     Pain Loc --      Pain Edu? --      Excl. in [place]? --    Constitutional: Alert and oriented. Well appearing and in no distress. Eyes: Normal exam ENT      Head: Normocephalic and atraumatic.      Mouth/Throat: Dry appearing mucous membranes. Cardiovascular: Regular rate and rhythm around 120 bpm.  No obvious murmur. Respiratory: Normal respiratory effort without tachypnea nor  retractions. Breath sounds are clear Gastrointestinal: Soft and nontender. No distention Musculoskeletal: Nontender with normal range of motion in all extremities.  Neurologic:  Normal speech and language. No gross focal neurologic deficits  Skin:  Skin is warm, dry and intact.  Psychiatric: Mood and affect are normal.   ____________________________________________   INITIAL IMPRESSION / ASSESSMENT AND PLAN / ED COURSE  Pertinent labs & imaging results that were available during my care of the patient were reviewed by me and considered in my medical decision making (see chart for details).   Patient presents to the emergency department for nausea vomiting.  Patient has a history of vomiting, states episodes occur every several months.  Last episode was 3 months ago.  Patient has a benign exam, no abdominal tenderness.  Clear lung sounds.  Heart sounds around 120 bpm at this time.  Patient received 1 L of fluids in triage.  We will dose additional 2 L of fluid given her anion gap of 18.  Lab work appears to show dehydration but otherwise is largely nonrevealing.  Urinalysis pending.  We will treat nausea, dose a GI cocktail and continue with IV hydration.  Patient agreeable to plan of care.  Patient states she is feeling much better after fluids.  Patient has received 3 L of IV fluids.  Patient's work-up is reassuring besides dehydration.  Urinalysis is overall normal besides a mild amount of ketones.  Patient is feeling better.  We will discharge with Phenergan.  Encouraged the patient to drink plenty of fluids.  Patient is to follow-up with her doctor.  Sandra Pearson was evaluated in Emergency Department on 03/06/2019 for the symptoms described in the history of present illness. She was evaluated in the context of the global COVID-19 pandemic, which necessitated consideration that the patient might be at risk for infection with the SARS-CoV-2 virus that causes COVID-19. Institutional protocols  and algorithms that pertain to the evaluation of patients at risk for COVID-19 are in a state of rapid change based on information released by regulatory bodies including the CDC and federal and state organizations. These policies and algorithms were followed during the patient's care in the ED.  ____________________________________________   FINAL CLINICAL IMPRESSION(S) / ED DIAGNOSES  Nausea vomiting   Minna Antis, MD 03/06/19 [phone]

## 2020-03-17 ENCOUNTER — Other Ambulatory Visit: Payer: Self-pay

## 2020-03-17 DIAGNOSIS — L03116 Cellulitis of left lower limb: Secondary | ICD-10-CM | POA: Insufficient documentation

## 2020-03-17 DIAGNOSIS — F17210 Nicotine dependence, cigarettes, uncomplicated: Secondary | ICD-10-CM | POA: Insufficient documentation

## 2020-03-17 DIAGNOSIS — R609 Edema, unspecified: Secondary | ICD-10-CM | POA: Insufficient documentation

## 2020-03-17 DIAGNOSIS — Z79899 Other long term (current) drug therapy: Secondary | ICD-10-CM | POA: Insufficient documentation

## 2020-03-17 DIAGNOSIS — L03115 Cellulitis of right lower limb: Secondary | ICD-10-CM | POA: Insufficient documentation

## 2020-03-17 LAB — COMPREHENSIVE METABOLIC PANEL
ALT: 66 U/L — ABNORMAL HIGH (ref 0–44)
AST: 129 U/L — ABNORMAL HIGH (ref 15–41)
Albumin: 3.4 g/dL — ABNORMAL LOW (ref 3.5–5.0)
Alkaline Phosphatase: 139 U/L — ABNORMAL HIGH (ref 38–126)
Anion gap: 8 (ref 5–15)
BUN: 7 mg/dL (ref 6–20)
CO2: 27 mmol/L (ref 22–32)
Calcium: 8.7 mg/dL — ABNORMAL LOW (ref 8.9–10.3)
Chloride: 102 mmol/L (ref 98–111)
Creatinine, Ser: 0.43 mg/dL — ABNORMAL LOW (ref 0.44–1.00)
GFR calc Af Amer: >60 mL/min (ref >60)
GFR calc non Af Amer: >60 mL/min (ref >60)
Glucose, Bld: 132 mg/dL — ABNORMAL HIGH (ref 70–99)
Potassium: 3.8 mmol/L (ref 3.5–5.1)
Sodium: 137 mmol/L (ref 135–145)
Total Bilirubin: 1 mg/dL (ref 0.3–1.2)
Total Protein: 6.8 g/dL (ref 6.5–8.1)

## 2020-03-17 NOTE — ED Triage Notes (Signed)
Patient reports having swelling in bilateral feet and legs for 2 days.  Redness and swelling noted bilaterally to feet, ankles and up to just below knees.

## 2020-03-18 ENCOUNTER — Emergency Department: Payer: Self-pay

## 2020-03-18 ENCOUNTER — Emergency Department
Admission: EM | Admit: 2020-03-18 | Discharge: 2020-03-18 | Disposition: A | Discharge status: Home / Self Care | Payer: Self-pay | Attending: Emergency Medicine | Admitting: Emergency Medicine

## 2020-03-18 DIAGNOSIS — L03119 Cellulitis of unspecified part of limb: Secondary | ICD-10-CM

## 2020-03-18 DIAGNOSIS — R609 Edema, unspecified: Secondary | ICD-10-CM

## 2020-03-18 LAB — CBC WITH DIFFERENTIAL/PLATELET
Abs Immature Granulocytes: 0.01 10*3/uL (ref 0.00–0.07)
Basophils Absolute: 0 10*3/uL (ref 0.0–0.1)
Basophils Relative: 1 %
Eosinophils Absolute: 0.1 10*3/uL (ref 0.0–0.5)
Eosinophils Relative: 2 %
HCT: 31.3 % — ABNORMAL LOW (ref 36.0–46.0)
Hemoglobin: 10 g/dL — ABNORMAL LOW (ref 12.0–15.0)
Immature Granulocytes: 0 %
Lymphocytes Relative: 32 %
Lymphs Abs: 1.2 10*3/uL (ref 0.7–4.0)
MCH: 31.6 pg (ref 26.0–34.0)
MCHC: 31.9 g/dL (ref 30.0–36.0)
MCV: 99.1 fL (ref 80.0–100.0)
Monocytes Absolute: 0.3 10*3/uL (ref 0.1–1.0)
Monocytes Relative: 7 %
Neutro Abs: 2.3 10*3/uL (ref 1.7–7.7)
Neutrophils Relative %: 58 %
Platelets: 114 10*3/uL — ABNORMAL LOW (ref 150–400)
RBC: 3.16 MIL/uL — ABNORMAL LOW (ref 3.87–5.11)
RDW: 22.7 % — ABNORMAL HIGH (ref 11.5–15.5)
WBC: 3.9 10*3/uL — ABNORMAL LOW (ref 4.0–10.5)
nRBC: 0 % (ref 0.0–0.2)

## 2020-03-18 LAB — BRAIN NATRIURETIC PEPTIDE: B Natriuretic Peptide: 37.7 pg/mL (ref 0.0–100.0)

## 2020-03-18 LAB — LACTIC ACID, PLASMA: Lactic Acid, Venous: 1.1 mmol/L (ref 0.5–1.9)

## 2020-03-18 MED ORDER — CLINDAMYCIN PHOSPHATE 600 MG/50ML IV SOLN
600.0000 mg | Freq: Once | INTRAVENOUS | Status: AC
  Administered 2020-03-18: 600 mg via INTRAVENOUS
  Filled 2020-03-18: qty 50

## 2020-03-18 MED ORDER — SODIUM CHLORIDE 0.9 % IV BOLUS
1000.0000 mL | Freq: Once | INTRAVENOUS | Status: AC
  Administered 2020-03-18: 1000 mL via INTRAVENOUS

## 2020-03-18 MED ORDER — CLINDAMYCIN HCL 300 MG PO CAPS: 300.0000 mg | ORAL_CAPSULE | Freq: Three times a day (TID) | ORAL | 0 refills | Status: DC

## 2020-03-18 MED ORDER — OXYCODONE-ACETAMINOPHEN 5-325 MG PO TABS: 1.0000 | ORAL_TABLET | ORAL | 0 refills | Status: DC | PRN

## 2020-03-18 MED ORDER — HYDROCODONE-ACETAMINOPHEN 5-325 MG PO TABS
1.0000 | ORAL_TABLET | Freq: Once | ORAL | Status: AC
  Administered 2020-03-18: 1 via ORAL
  Filled 2020-03-18: qty 1

## 2020-03-18 NOTE — Discharge Instructions (Signed)
Take antibiotic as prescribed (Clindamycin 300mg  three times daily x 7 days). You may take Ibuprofen as needed for pain; Percocet as needed for more severe pain. Return to the ER for worsening symptoms, persistent vomiting, difficulty breathing or other concerns.

## 2020-03-18 NOTE — ED Provider Notes (Signed)
[facility] Emergency Department Provider Note   ____________________________________________   First MD Initiated Contact with Patient 03/18/20 0304     (approximate)  I have reviewed the triage vital signs and the nursing notes.   HISTORY  Chief Complaint Leg Swelling    HPI Sandra Pearson is a 34 y.o. female who presents to the ED from home with a chief complaint of bilateral leg swelling and redness.  Symptoms x2 days.  Denies fever, chest pain, shortness of breath, abdominal pain, nausea or vomiting.  Denies recent travel, trauma.  Does not take diuretics.       Past Medical History:  Diagnosis Date  . Anemia   . Hypertension     Patient Active Problem List   Diagnosis Date Noted  . Complex ovarian cyst 11/22/2018  . Intractable vomiting with nausea 11/22/2018    Past Surgical History:  Procedure Laterality Date  . MANDIBLE FRACTURE SURGERY      Prior to Admission medications   Medication Sig Start Date End Date Taking? Authorizing Provider  clindamycin (CLEOCIN) 300 MG capsule Take 1 capsule (300 mg total) by mouth 3 (three) times daily. 03/18/20   Irean Hong, MD  folic acid (FOLVITE) 1 MG tablet Take 1 tablet (1 mg total) by mouth daily. 02/18/19   Triplett, Rulon Eisenmenger B, FNP  gabapentin (NEURONTIN) 100 MG capsule Take 1 capsule (100 mg total) by mouth 3 (three) times daily as needed. 02/18/19 05/19/19  Triplett, Kasandra Knudsen, FNP  ondansetron (ZOFRAN) 4 MG tablet Take 1 tablet (4 mg total) by mouth every 8 (eight) hours as needed for nausea or vomiting. 02/18/19   Triplett, Cari B, FNP  oxyCODONE-acetaminophen (PERCOCET/ROXICET) 5-325 MG tablet Take 1 tablet by mouth every 4 (four) hours as needed for severe pain. 03/18/20   Irean Hong, MD  promethazine (PHENERGAN) 25 MG tablet Take 1 tablet (25 mg total) by mouth every 6 (six) hours as needed for nausea or vomiting. 03/06/19   Minna Antis, MD  traMADol (ULTRAM) 50 MG tablet Take 1 tablet (50  mg total) by mouth every 6 (six) hours as needed. 02/18/19   Chinita Pester, FNP    Allergies Patient has no known allergies.  Family History  Problem Relation Age of Onset  . Hepatitis C Mother   . Heart disease Mother     Social History Social History   Tobacco Use  . Smoking status: Current Every Day Smoker    Packs/day: 1.00    Types: Cigarettes  . Smokeless tobacco: Never Used  Substance Use Topics  . Alcohol use: No    Comment: rarely   . Drug use: No    Review of Systems  Constitutional: No fever/chills Eyes: No visual changes. ENT: No sore throat. Cardiovascular: Denies chest pain. Respiratory: Denies shortness of breath. Gastrointestinal: No abdominal pain.  No nausea, no vomiting.  No diarrhea.  No constipation. Genitourinary: Negative for dysuria. Musculoskeletal: Positive for BLE redness and swelling.  Negative for back pain. Skin: Negative for rash. Neurological: Negative for headaches, focal weakness or numbness.   ____________________________________________   PHYSICAL EXAM:  VITAL SIGNS: ED Triage Vitals [03/17/20 2158]  Enc Vitals Group     BP (!) 148/98     Pulse Rate (!) 118     Resp 18     Temp 98.4 F (36.9 C)     Temp Source Oral     SpO2 100 %     Weight 125 lb (56.7 kg)  Height 5\' 9"  (1.753 m)     Head Circumference      Peak Flow      Pain Score      Pain Loc      Pain Edu?      Excl. in [place]?     Constitutional: Alert and oriented. Well appearing and in no acute distress. Eyes: Conjunctivae are normal. PERRL. EOMI. Head: Atraumatic. Nose: No congestion/rhinnorhea. Mouth/Throat: Mucous membranes are moist.  Oropharynx non-erythematous. Neck: No stridor.   Cardiovascular: Normal rate, regular rhythm. Grossly normal heart sounds.  Good peripheral circulation. Respiratory: Normal respiratory effort.  No retractions. Lungs CTAB. Gastrointestinal: Soft and nontender. No distention. No abdominal bruits. No CVA  tenderness. Musculoskeletal: BLE mild nonpitting edema, warmth and erythema from feet to knees.  1+ distal pulses bilaterally.  Symmetrically warm limbs without evidence for ischemia.  Less than 5-second capillary refill. Neurologic:  Normal speech and language. No gross focal neurologic deficits are appreciated.  Skin:  Skin is warm, dry and intact. No rash noted. Psychiatric: Mood and affect are normal. Speech and behavior are normal.  ____________________________________________   LABS (all labs ordered are listed, but only abnormal results are displayed)  Labs Reviewed  CBC WITH DIFFERENTIAL/PLATELET - Abnormal; Notable for the following components:      Result Value   WBC 3.9 (*)    RBC 3.16 (*)    Hemoglobin 10.0 (*)    HCT 31.3 (*)    RDW 22.7 (*)    Platelets 114 (*)    All other components within normal limits  COMPREHENSIVE METABOLIC PANEL - Abnormal; Notable for the following components:   Glucose, Bld 132 (*)    Creatinine, Ser 0.43 (*)    Calcium 8.7 (*)    Albumin 3.4 (*)    AST 129 (*)    ALT 66 (*)    Alkaline Phosphatase 139 (*)    All other components within normal limits  CULTURE, BLOOD (ROUTINE X 2)  CULTURE, BLOOD (ROUTINE X 2)  BRAIN NATRIURETIC PEPTIDE  LACTIC ACID, PLASMA  LACTIC ACID, PLASMA   ____________________________________________  EKG  None ____________________________________________  RADIOLOGY  ED MD interpretation: No DVT  Official radiology report(s): US Venous Img Lower Bilateral (DVT)  Result Date: 03/18/2020 CLINICAL DATA:  Initial evaluation for bilateral lower extremity swelling and redness for 2 days. EXAM: BILATERAL LOWER EXTREMITY VENOUS DOPPLER ULTRASOUND TECHNIQUE: Gray-scale sonography with graded compression, as well as color Doppler and duplex ultrasound were performed to evaluate the lower extremity deep venous systems from the level of the common femoral vein and including the common femoral, femoral, profunda  femoral, popliteal and calf veins including the posterior tibial, peroneal and gastrocnemius veins when visible. The superficial great saphenous vein was also interrogated. Spectral Doppler was utilized to evaluate flow at rest and with distal augmentation maneuvers in the common femoral, femoral and popliteal veins. COMPARISON:  None. FINDINGS: RIGHT LOWER EXTREMITY Common Femoral Vein: No evidence of thrombus. Normal compressibility, respiratory phasicity and response to augmentation. Saphenofemoral Junction: No evidence of thrombus. Normal compressibility and flow on color Doppler imaging. Profunda Femoral Vein: No evidence of thrombus. Normal compressibility and flow on color Doppler imaging. Femoral Vein: No evidence of thrombus. Normal compressibility, respiratory phasicity and response to augmentation. Popliteal Vein: No evidence of thrombus. Normal compressibility, respiratory phasicity and response to augmentation. Calf Veins: No evidence of thrombus. Normal compressibility and flow on color Doppler imaging. Superficial Great Saphenous Vein: No evidence of thrombus. Normal compressibility. Venous Reflux:  None. Other Findings:  None. LEFT LOWER EXTREMITY Common Femoral Vein: No evidence of thrombus. Normal compressibility, respiratory phasicity and response to augmentation. Saphenofemoral Junction: No evidence of thrombus. Normal compressibility and flow on color Doppler imaging. Profunda Femoral Vein: No evidence of thrombus. Normal compressibility and flow on color Doppler imaging. Femoral Vein: No evidence of thrombus. Normal compressibility, respiratory phasicity and response to augmentation. Popliteal Vein: No evidence of thrombus. Normal compressibility, respiratory phasicity and response to augmentation. Calf Veins: No evidence of thrombus. Normal compressibility and flow on color Doppler imaging. Superficial Great Saphenous Vein: No evidence of thrombus. Normal compressibility. Venous Reflux:  None.  Other Findings:  None. IMPRESSION: No evidence of deep venous thrombosis in either lower extremity. Electronically Signed   By: Rise Mu M.D.   On: 03/18/2020 02:17    ____________________________________________   PROCEDURES  Procedure(s) performed (including Critical Care):  Procedures   ____________________________________________   INITIAL IMPRESSION / ASSESSMENT AND PLAN / ED COURSE  As part of my medical decision making, I reviewed the following data within the electronic MEDICAL RECORD NUMBER Nursing notes reviewed and incorporated, Labs reviewed, Radiograph reviewed, Notes from prior ED visits and [place] Controlled Substance Database     [place] was evaluated in Emergency Department on 03/18/2020 for the symptoms described in the history of present illness. She was evaluated in the context of the global COVID-19 pandemic, which necessitated consideration that the patient might be at risk for infection with the SARS-CoV-2 virus that causes COVID-19. Institutional protocols and algorithms that pertain to the evaluation of patients at risk for COVID-19 are in a state of rapid change based on information released by regulatory bodies including the CDC and federal and state organizations. These policies and algorithms were followed during the patient's care in the ED.    35 year old female presenting with BLE edema, redness and swelling.  Differential diagnosis includes but is not limited to peripheral edema, DVT, cellulitis, etc.  Laboratory results demonstrate normal WBC, minimally elevated transaminases, BMP unremarkable.  Doppler ultrasound negative for DVT.  Will obtain blood cultures, lactic acid.  Administer IV Clindamycin for cellulitis, Norco for pain.  Will reassess.   Clinical Course as of Mar 19 639  Sun Mar 18, 2020  2831 Patient resting in no acute distress.  IV clindamycin completed.  Will discharge home on Clindamycin and Percocet as needed for pain.   Patient will follow up closely with her PCP.  Strict return precautions given.  Patient verbalizes understanding and agrees with plan of care.   [JS]    Clinical Course User Index [JS] Irean Hong, MD     ____________________________________________   FINAL CLINICAL IMPRESSION(S) / ED DIAGNOSES  Final diagnoses:  Peripheral edema  Cellulitis of lower extremity, unspecified laterality     ED Discharge Orders         Ordered    clindamycin (CLEOCIN) 300 MG capsule  3 times daily     03/18/20 0524    oxyCODONE-acetaminophen (PERCOCET/ROXICET) 5-325 MG tablet  Every 4 hours PRN     03/18/20 0524           Note:  This document was prepared using Dragon voice recognition software and may include unintentional dictation errors.   Irean Hong, MD 03/18/20 [phone]

## 2020-03-23 LAB — CULTURE, BLOOD (ROUTINE X 2)
Culture: NO GROWTH
Culture: NO GROWTH
Special Requests: ADEQUATE

## 2021-03-12 ENCOUNTER — Inpatient Hospital Stay
Admission: EM | Admit: 2021-03-12 | Discharge: 2021-03-15 | DRG: 812 | Disposition: A | Discharge status: Home / Self Care | Payer: Self-pay | Attending: Internal Medicine | Admitting: Internal Medicine

## 2021-03-12 ENCOUNTER — Emergency Department: Payer: Self-pay

## 2021-03-12 ENCOUNTER — Other Ambulatory Visit: Payer: Self-pay

## 2021-03-12 DIAGNOSIS — Z20822 Contact with and (suspected) exposure to covid-19: Secondary | ICD-10-CM | POA: Diagnosis present

## 2021-03-12 DIAGNOSIS — D509 Iron deficiency anemia, unspecified: Principal | ICD-10-CM | POA: Diagnosis present

## 2021-03-12 DIAGNOSIS — Z8249 Family history of ischemic heart disease and other diseases of the circulatory system: Secondary | ICD-10-CM

## 2021-03-12 DIAGNOSIS — K449 Diaphragmatic hernia without obstruction or gangrene: Secondary | ICD-10-CM | POA: Diagnosis present

## 2021-03-12 DIAGNOSIS — K2100 Gastro-esophageal reflux disease with esophagitis, without bleeding: Secondary | ICD-10-CM | POA: Diagnosis present

## 2021-03-12 DIAGNOSIS — K209 Esophagitis, unspecified without bleeding: Secondary | ICD-10-CM

## 2021-03-12 DIAGNOSIS — I10 Essential (primary) hypertension: Secondary | ICD-10-CM | POA: Diagnosis present

## 2021-03-12 DIAGNOSIS — N920 Excessive and frequent menstruation with regular cycle: Secondary | ICD-10-CM | POA: Diagnosis present

## 2021-03-12 DIAGNOSIS — E876 Hypokalemia: Secondary | ICD-10-CM | POA: Diagnosis present

## 2021-03-12 DIAGNOSIS — K319 Disease of stomach and duodenum, unspecified: Secondary | ICD-10-CM

## 2021-03-12 DIAGNOSIS — Z79899 Other long term (current) drug therapy: Secondary | ICD-10-CM

## 2021-03-12 DIAGNOSIS — F17210 Nicotine dependence, cigarettes, uncomplicated: Secondary | ICD-10-CM | POA: Diagnosis present

## 2021-03-12 DIAGNOSIS — B9681 Helicobacter pylori [H. pylori] as the cause of diseases classified elsewhere: Secondary | ICD-10-CM | POA: Diagnosis present

## 2021-03-12 DIAGNOSIS — D649 Anemia, unspecified: Principal | ICD-10-CM | POA: Diagnosis present

## 2021-03-12 DIAGNOSIS — K909 Intestinal malabsorption, unspecified: Secondary | ICD-10-CM | POA: Diagnosis present

## 2021-03-12 DIAGNOSIS — K2950 Unspecified chronic gastritis without bleeding: Secondary | ICD-10-CM | POA: Diagnosis present

## 2021-03-12 DIAGNOSIS — E538 Deficiency of other specified B group vitamins: Secondary | ICD-10-CM | POA: Diagnosis present

## 2021-03-12 LAB — CBC WITH DIFFERENTIAL/PLATELET
Abs Immature Granulocytes: 0.01 10*3/uL (ref 0.00–0.07)
Basophils Absolute: 0 10*3/uL (ref 0.0–0.1)
Basophils Relative: 1 %
Eosinophils Absolute: 0.1 10*3/uL (ref 0.0–0.5)
Eosinophils Relative: 1 %
HCT: 23.6 % — ABNORMAL LOW (ref 36.0–46.0)
Hemoglobin: 6.5 g/dL — ABNORMAL LOW (ref 12.0–15.0)
Immature Granulocytes: 0 %
Lymphocytes Relative: 23 %
Lymphs Abs: 1.1 10*3/uL (ref 0.7–4.0)
MCH: 22.3 pg — ABNORMAL LOW (ref 26.0–34.0)
MCHC: 27.5 g/dL — ABNORMAL LOW (ref 30.0–36.0)
MCV: 80.8 fL (ref 80.0–100.0)
Monocytes Absolute: 0.6 10*3/uL (ref 0.1–1.0)
Monocytes Relative: 12 %
Neutro Abs: 3.1 10*3/uL (ref 1.7–7.7)
Neutrophils Relative %: 63 %
Platelets: 176 10*3/uL (ref 150–400)
RBC: 2.92 MIL/uL — ABNORMAL LOW (ref 3.87–5.11)
RDW: 26 % — ABNORMAL HIGH (ref 11.5–15.5)
Smear Review: NORMAL
WBC: 4.9 10*3/uL (ref 4.0–10.5)
nRBC: 0 % (ref 0.0–0.2)

## 2021-03-12 LAB — COMPREHENSIVE METABOLIC PANEL
ALT: 33 U/L (ref 0–44)
AST: 51 U/L — ABNORMAL HIGH (ref 15–41)
Albumin: 2.7 g/dL — ABNORMAL LOW (ref 3.5–5.0)
Alkaline Phosphatase: 148 U/L — ABNORMAL HIGH (ref 38–126)
Anion gap: 10 (ref 5–15)
BUN: 8 mg/dL (ref 6–20)
CO2: 22 mmol/L (ref 22–32)
Calcium: 8.3 mg/dL — ABNORMAL LOW (ref 8.9–10.3)
Chloride: 101 mmol/L (ref 98–111)
Creatinine, Ser: 0.48 mg/dL (ref 0.44–1.00)
GFR, Estimated: >60 mL/min (ref >60)
Glucose, Bld: 118 mg/dL — ABNORMAL HIGH (ref 70–99)
Potassium: 3.3 mmol/L — ABNORMAL LOW (ref 3.5–5.1)
Sodium: 133 mmol/L — ABNORMAL LOW (ref 135–145)
Total Bilirubin: 0.6 mg/dL (ref 0.3–1.2)
Total Protein: 6.6 g/dL (ref 6.5–8.1)

## 2021-03-12 LAB — IRON AND TIBC
Iron: 19 ug/dL — ABNORMAL LOW (ref 28–170)
Saturation Ratios: 4 % — ABNORMAL LOW (ref 10.4–31.8)
TIBC: 515 ug/dL — ABNORMAL HIGH (ref 250–450)
UIBC: 496 ug/dL

## 2021-03-12 LAB — PROTIME-INR
INR: 1.1 (ref 0.8–1.2)
Prothrombin Time: 14 seconds (ref 11.4–15.2)

## 2021-03-12 LAB — PREPARE RBC (CROSSMATCH)

## 2021-03-12 LAB — MAGNESIUM: Magnesium: 1.3 mg/dL — ABNORMAL LOW (ref 1.7–2.4)

## 2021-03-12 LAB — RESP PANEL BY RT-PCR (FLU A&B, COVID) ARPGX2
Influenza A by PCR: NEGATIVE
Influenza B by PCR: NEGATIVE
SARS Coronavirus 2 by RT PCR: NEGATIVE

## 2021-03-12 LAB — FERRITIN: Ferritin: 11 ng/mL (ref 11–307)

## 2021-03-12 LAB — PHOSPHORUS: Phosphorus: 3.4 mg/dL (ref 2.5–4.6)

## 2021-03-12 MED ORDER — SODIUM CHLORIDE 0.9 % IV SOLN
10.0000 mL/h | Freq: Once | INTRAVENOUS | Status: AC
  Administered 2021-03-12: 10 mL/h via INTRAVENOUS

## 2021-03-12 MED ORDER — ACETAMINOPHEN 650 MG RE SUPP: 650.0000 mg | Freq: Four times a day (QID) | RECTAL | Status: DC | PRN

## 2021-03-12 MED ORDER — POTASSIUM CHLORIDE CRYS ER 20 MEQ PO TBCR
40.0000 meq | EXTENDED_RELEASE_TABLET | Freq: Once | ORAL | Status: AC
  Administered 2021-03-12: 40 meq via ORAL
  Filled 2021-03-12: qty 2

## 2021-03-12 MED ORDER — ONDANSETRON HCL 4 MG PO TABS
4.0000 mg | ORAL_TABLET | Freq: Four times a day (QID) | ORAL | Status: DC | PRN
  Administered 2021-03-14: 4 mg via ORAL
  Filled 2021-03-12: qty 1

## 2021-03-12 MED ORDER — ONDANSETRON HCL 4 MG/2ML IJ SOLN
4.0000 mg | Freq: Four times a day (QID) | INTRAMUSCULAR | Status: DC | PRN
  Administered 2021-03-14 – 2021-03-15 (×2): 4 mg via INTRAVENOUS
  Filled 2021-03-12 (×2): qty 2

## 2021-03-12 MED ORDER — ACETAMINOPHEN 325 MG PO TABS
650.0000 mg | ORAL_TABLET | Freq: Four times a day (QID) | ORAL | Status: DC | PRN
  Administered 2021-03-12 – 2021-03-14 (×4): 650 mg via ORAL
  Filled 2021-03-12 (×4): qty 2

## 2021-03-12 MED ORDER — POLYETHYLENE GLYCOL 3350 17 G PO PACK: 17.0000 g | PACK | Freq: Every day | ORAL | Status: DC | PRN

## 2021-03-12 MED ORDER — MAGNESIUM SULFATE 2 GM/50ML IV SOLN
2.0000 g | Freq: Once | INTRAVENOUS | Status: AC
  Administered 2021-03-12: 21:00:00 2 g via INTRAVENOUS
  Filled 2021-03-12: qty 50

## 2021-03-12 NOTE — ED Provider Notes (Signed)
ED ECG REPORT I, Dionne Bucy, the attending physician, personally viewed and interpreted this ECG.  Date: 03/12/2021 EKG Time: 1458 Rate: 104 Rhythm: Sinus tachycardia QRS Axis: normal Intervals: normal ST/T Wave abnormalities: normal Narrative Interpretation: no evidence of acute ischemia   Dionne Bucy, MD 03/12/21 1533

## 2021-03-12 NOTE — Plan of Care (Signed)

## 2021-03-12 NOTE — ED Provider Notes (Signed)
[facility] Emergency Department Provider Note  ____________________________________________   Event Date/Time   First MD Initiated Contact with Patient 03/12/21 1217     (approximate)  I have reviewed the triage vital signs and the nursing notes.   HISTORY  Chief Complaint cellulitis   HPI Sandra Pearson is a 35 y.o. female presents to the ED with complaint of bilateral leg swelling.  Patient states that she has had difficulties with her lower extremities for over 2 years.  Patient states that the edema comes and goes.  The last time she was evaluated for this she was told she had cellulitis and peripheral vascular disease.  Today she is worried about a DVT.  Patient is a smoker for approximately 17 years and currently smokes 2 to 3 cigarettes/day.  Patient also offers information about fatigue and occasionally feeling dizzy.  Currently she does not have a PCP.      Past Medical History:  Diagnosis Date  . Anemia   . Hypertension     Patient Active Problem List   Diagnosis Date Noted  . Complex ovarian cyst 11/22/2018  . Intractable vomiting with nausea 11/22/2018    Past Surgical History:  Procedure Laterality Date  . MANDIBLE FRACTURE SURGERY      Prior to Admission medications   Medication Sig Start Date End Date Taking? Authorizing Provider  clindamycin (CLEOCIN) 300 MG capsule Take 1 capsule (300 mg total) by mouth 3 (three) times daily. 03/18/20   Irean Hong, MD  folic acid (FOLVITE) 1 MG tablet Take 1 tablet (1 mg total) by mouth daily. 02/18/19   Triplett, Rulon Eisenmenger B, FNP  gabapentin (NEURONTIN) 100 MG capsule Take 1 capsule (100 mg total) by mouth 3 (three) times daily as needed. 02/18/19 05/19/19  Triplett, Kasandra Knudsen, FNP  ondansetron (ZOFRAN) 4 MG tablet Take 1 tablet (4 mg total) by mouth every 8 (eight) hours as needed for nausea or vomiting. 02/18/19   Triplett, Cari B, FNP  oxyCODONE-acetaminophen (PERCOCET/ROXICET) 5-325 MG tablet Take 1  tablet by mouth every 4 (four) hours as needed for severe pain. 03/18/20   Irean Hong, MD  promethazine (PHENERGAN) 25 MG tablet Take 1 tablet (25 mg total) by mouth every 6 (six) hours as needed for nausea or vomiting. 03/06/19   Minna Antis, MD  traMADol (ULTRAM) 50 MG tablet Take 1 tablet (50 mg total) by mouth every 6 (six) hours as needed. 02/18/19   Chinita Pester, FNP    Allergies Patient has no known allergies.  Family History  Problem Relation Age of Onset  . Hepatitis C Mother   . Heart disease Mother     Social History Social History   Tobacco Use  . Smoking status: Current Every Day Smoker    Packs/day: 1.00    Types: Cigarettes  . Smokeless tobacco: Never Used  Vaping Use  . Vaping Use: Some days  Substance Use Topics  . Alcohol use: No    Comment: rarely   . Drug use: No    Review of Systems Constitutional: No fever/chills Eyes: No visual changes. ENT: No sore throat. Cardiovascular: Denies chest pain. Respiratory: Denies shortness of breath.  Negative for cough. Gastrointestinal: No abdominal pain.  No nausea, no vomiting.  No diarrhea.  Genitourinary: Negative for dysuria.  Amenorrhea x1 year. Musculoskeletal: Chronic issues with bilateral lower extremities. Skin: Negative for rash. Neurological: Negative for headaches.  Generalized weakness. ____________________________________________   PHYSICAL EXAM:  VITAL SIGNS: ED Triage Vitals  Enc Vitals Group     BP 03/12/21 1216 123/84     Pulse Rate 03/12/21 1216 (!) 121     Resp 03/12/21 1216 18     Temp 03/12/21 1216 98.9 F (37.2 C)     Temp Source 03/12/21 1216 Oral     SpO2 03/12/21 1216 100 %     Weight --      Height --      Head Circumference --      Peak Flow --      Pain Score 03/12/21 1214 10     Pain Loc --      Pain Edu? --      Excl. in GC? --     Constitutional: Alert and oriented. Well appearing and in no acute distress. Eyes: Conjunctivae are normal. PERRL.  EOMI. Head: Atraumatic. Nose: No congestion/rhinnorhea. Mouth/Throat: Mucous membranes are moist.  Oropharynx non-erythematous. Neck: No stridor.   Cardiovascular: Normal rate, regular rhythm. Grossly normal heart sounds.  Good peripheral circulation. Respiratory: Normal respiratory effort.  No retractions. Lungs CTAB. Gastrointestinal: Soft and nontender. No distention.  Bowel sounds normoactive x4 quadrants. Musculoskeletal: Moves upper and lower extremities they have difficulty.  On examination of the lower extremities there is no pitting edema appreciated.  No erythema or warmth to the skin.  Denna Haggard' sign was negative.  Dorsalis pedis bilaterally intact.  Skin and feet show chronic changes which questionably is related to venous insufficiency. Neurologic:  Normal speech and language. No gross focal neurologic deficits are appreciated. No gait instability. Skin:  Skin is warm, dry and intact.  Skin is extremely dry but no open lesions noted. Psychiatric: Mood and affect are normal. Speech and behavior are normal.  ____________________________________________   LABS (all labs ordered are listed, but only abnormal results are displayed)  Labs Reviewed  CBC WITH DIFFERENTIAL/PLATELET - Abnormal; Notable for the following components:      Result Value   RBC 2.92 (*)    Hemoglobin 6.5 (*)    HCT 23.6 (*)    MCH 22.3 (*)    MCHC 27.5 (*)    RDW 26.0 (*)    All other components within normal limits  COMPREHENSIVE METABOLIC PANEL - Abnormal; Notable for the following components:   Sodium 133 (*)    Potassium 3.3 (*)    Glucose, Bld 118 (*)    Calcium 8.3 (*)    Albumin 2.7 (*)    AST 51 (*)    Alkaline Phosphatase 148 (*)    All other components within normal limits  IRON AND TIBC - Abnormal; Notable for the following components:   Iron 19 (*)    TIBC 515 (*)    Saturation Ratios 4 (*)    All other components within normal limits  RESP PANEL BY RT-PCR (FLU A&B, COVID) ARPGX2   PROTIME-INR  FERRITIN  PREPARE RBC (CROSSMATCH)  TYPE AND SCREEN   ____________________________________________  EKG  Sinus tachycardia tachycardia with ventricular rate of 104. ____________________________________________  RADIOLOGY Beaulah Corin, personally viewed and evaluated these images (plain radiographs) as part of my medical decision making, as well as reviewing the written report by the radiologist.  ED MD interpretation:    Official radiology report(s): US Venous Img Lower Bilateral  Result Date: 03/12/2021 CLINICAL DATA:  35 year old female with bilateral lower extremity pain and swelling over the past 2 weeks. EXAM: BILATERAL LOWER EXTREMITY VENOUS DOPPLER ULTRASOUND TECHNIQUE: Gray-scale sonography with graded compression, as well as color Doppler and duplex  ultrasound were performed to evaluate the lower extremity deep venous systems from the level of the common femoral vein and including the common femoral, femoral, profunda femoral, popliteal and calf veins including the posterior tibial, peroneal and gastrocnemius veins when visible. The superficial great saphenous vein was also interrogated. Spectral Doppler was utilized to evaluate flow at rest and with distal augmentation maneuvers in the common femoral, femoral and popliteal veins. COMPARISON:  None. FINDINGS: RIGHT LOWER EXTREMITY Common Femoral Vein: No evidence of thrombus. Normal compressibility, respiratory phasicity and response to augmentation. Saphenofemoral Junction: No evidence of thrombus. Normal compressibility and flow on color Doppler imaging. Profunda Femoral Vein: No evidence of thrombus. Normal compressibility and flow on color Doppler imaging. Femoral Vein: No evidence of thrombus. Normal compressibility, respiratory phasicity and response to augmentation. Popliteal Vein: No evidence of thrombus. Normal compressibility, respiratory phasicity and response to augmentation. Calf Veins: No evidence of  thrombus. Normal compressibility and flow on color Doppler imaging. Other Findings:  None. LEFT LOWER EXTREMITY Common Femoral Vein: No evidence of thrombus. Normal compressibility, respiratory phasicity and response to augmentation. Saphenofemoral Junction: No evidence of thrombus. Normal compressibility and flow on color Doppler imaging. Profunda Femoral Vein: No evidence of thrombus. Normal compressibility and flow on color Doppler imaging. Femoral Vein: No evidence of thrombus. Normal compressibility, respiratory phasicity and response to augmentation. Popliteal Vein: No evidence of thrombus. Normal compressibility, respiratory phasicity and response to augmentation. Calf Veins: No evidence of thrombus. Normal compressibility and flow on color Doppler imaging. Other Findings:  None. IMPRESSION: No evidence of bilateral lower extremity deep venous thrombosis. Marliss Cootsylan Suttle, MD [facility] Electronically Signed   By: Marliss Cootsylan  Suttle MD   On: 03/12/2021 14:16    ____________________________________________   PROCEDURES  Procedure(s) performed (including Critical Care):  Procedures   ____________________________________________   INITIAL IMPRESSION / ASSESSMENT AND PLAN / ED COURSE  As part of my medical decision making, I reviewed the following data within the electronic MEDICAL RECORD NUMBER Notes from prior ED visits and [place] Controlled Substance Database  ----------------------------------------- 2:23 PM on 03/12/2021 ----------------------------------------- Lab work shows patient hemoglobin 6.5/hematocrit 23.6.  Patient states that no menses x1 year.  Patient denies any chest pain, shortness of breath, change in bowel habits or color.  She does report dizziness and weakness especially with exertion.  Patient was reassured that no DVT was noted on her ultrasound.  She is agreeable to be admitted for evaluation and blood transfusion for her  severe anemia.  ____________________________________________   FINAL CLINICAL IMPRESSION(S) / ED DIAGNOSES  Final diagnoses:  Symptomatic anemia     ED Discharge Orders    None      *Please note:  Pollyann SamplesBrittany  was evaluated in Emergency Department on 03/12/2021 for the symptoms described in the history of present illness. She was evaluated in the context of the global COVID-19 pandemic, which necessitated consideration that the patient might be at risk for infection with the SARS-CoV-2 virus that causes COVID-19. Institutional protocols and algorithms that pertain to the evaluation of patients at risk for COVID-19 are in a state of rapid change based on information released by regulatory bodies including the CDC and federal and state organizations. These policies and algorithms were followed during the patient's care in the ED.  Some ED evaluations and interventions may be delayed as a result of limited staffing during and the pandemic.*   Note:  This document was prepared using Dragon voice recognition software and may include unintentional  dictation errors.    Tommi Rumps, PA-C 03/12/21 1635    Dionne Bucy, MD 03/13/21 [phone]

## 2021-03-12 NOTE — H&P (Signed)
History and Physical:    Sandra Pearson   WUX:[id number] DOB: October 13, 1986 DOA: 03/12/2021  Referring MD/provider: Bridget Hartshorn, PA PCP: Patient, No Pcp Per (Inactive)   Patient coming from: Home  Chief Complaint: Shortness of breath  History of Present Illness:   Sandra Pearson is a 35 y.o. female with medical history significant for hypertension, iron deficiency anemia, heavy menses many years ago (teenage years), tobacco use disorder.  She presented to the hospital because of shortness of breath, generalized weakness, easy fatigability and dizziness.  Reportedly, she had told ED triage team that she was concerned about cellulitis.    She says she has been feeling generally weak and tired for about a year now.  She said her husband has been helping her at home.  Sometimes she has to use a cane to get around.  She has not seen a physician for about 2 years. Her symptoms have progressively worsened over the past few weeks.  Many years ago, when she was around 35 years old, she was started on iron pills because she used to have heavy menses.  She was started on oral contraceptives at age 26 because of heavy manses.  However, she said that she has not had any menses for over 2 years.  She reported intermittent lower extremity pain and swelling.  No fever, abdominal pain hematuria,  hematemesis, melena or hematochezia.   ED Course:  The patient was tachycardic with heart rate in the 120s when she first arrived in the ED.  Other vital signs were unremarkable.  Hemoglobin was 6.5.  Venous duplex of lower extremities was negative for DVT.  ROS:   ROS all other systems reviewed were negative.  Past Medical History:   Past Medical History:  Diagnosis Date  . Anemia   . Hypertension     Past Surgical History:   Past Surgical History:  Procedure Laterality Date  . MANDIBLE FRACTURE SURGERY      Social History:   Social History   Socioeconomic History  . Marital status:  Married    Spouse name: Not on file  . Number of children: Not on file  . Years of education: Not on file  . Highest education level: Not on file  Occupational History  . Not on file  Tobacco Use  . Smoking status: Current Every Day Smoker    Packs/day: 1.00    Types: Cigarettes  . Smokeless tobacco: Never Used  Vaping Use  . Vaping Use: Some days  Substance and Sexual Activity  . Alcohol use: No    Comment: rarely   . Drug use: No  . Sexual activity: Yes  Other Topics Concern  . Not on file  Social History Narrative  . Not on file   Social Determinants of Health   Financial Resource Strain: Not on file  Food Insecurity: Not on file  Transportation Needs: Not on file  Physical Activity: Not on file  Stress: Not on file  Social Connections: Not on file  Intimate Partner Violence: Not on file    Allergies   Patient has no known allergies.  Family history:   Family History  Problem Relation Age of Onset  . Hepatitis C Mother   . Heart disease Mother     Current Medications:   Prior to Admission medications   Medication Sig Start Date End Date Taking? Authorizing Provider  clindamycin (CLEOCIN) 300 MG capsule Take 1 capsule (300 mg total) by mouth 3 (three) times daily. Patient not  taking: No sig reported 03/18/20   Irean Hong, MD  folic acid (FOLVITE) 1 MG tablet Take 1 tablet (1 mg total) by mouth daily. Patient not taking: No sig reported 02/18/19   Triplett, Rulon Eisenmenger B, FNP  gabapentin (NEURONTIN) 100 MG capsule Take 1 capsule (100 mg total) by mouth 3 (three) times daily as needed. 02/18/19 05/19/19  Triplett, Rulon Eisenmenger B, FNP  ondansetron (ZOFRAN) 4 MG tablet Take 1 tablet (4 mg total) by mouth every 8 (eight) hours as needed for nausea or vomiting. Patient not taking: No sig reported 02/18/19   Kem Boroughs B, FNP  oxyCODONE-acetaminophen (PERCOCET/ROXICET) 5-325 MG tablet Take 1 tablet by mouth every 4 (four) hours as needed for severe pain. Patient not taking: No  sig reported 03/18/20   Irean Hong, MD  promethazine (PHENERGAN) 25 MG tablet Take 1 tablet (25 mg total) by mouth every 6 (six) hours as needed for nausea or vomiting. Patient not taking: No sig reported 03/06/19   Minna Antis, MD  traMADol (ULTRAM) 50 MG tablet Take 1 tablet (50 mg total) by mouth every 6 (six) hours as needed. Patient not taking: No sig reported 02/18/19   Chinita Pester, FNP    Physical Exam:   Vitals:   03/12/21 1216 03/12/21 1511 03/12/21 1638  BP: 123/84 121/90 111/73  Pulse: (!) 121 98 90  Resp: 18 17 16   Temp: 98.9 F (37.2 C) 98.4 F (36.9 C) 99 F (37.2 C)  TempSrc: Oral Oral Oral  SpO2: 100% 100%      Physical Exam: Blood pressure 111/73, pulse 90, temperature 99 F (37.2 C), temperature source Oral, resp. rate 16, SpO2 100 %, unknown if currently breastfeeding. Gen: No acute distress. Head: Normocephalic, atraumatic. Eyes: Pupils equal, round and reactive to light. Extraocular movements intact.  Sclerae nonicteric. Pale conjuctiva Mouth:  Neck: Supple, no thyromegaly, no lymphadenopathy, no jugular venous distention. Chest: Lungs are clear to auscultation with good air movement. No rales, rhonchi or wheezes.  CV: Heart sounds are regular with an S1, S2. No murmurs, rubs or gallops.  Abdomen: Soft, nontender, nondistended with normal active bowel sounds. No palpable masses. Extremities: Extremities are without clubbing, or cyanosis. No edema. Pedal pulses 2+.  Skin: Warm and dry. Neuro: Alert and oriented times 3; grossly nonfocal.  Psych: Insight is good and judgment is appropriate. Mood and affect normal.   Data Review:    Labs: Basic Metabolic Panel: Recent Labs  Lab 03/12/21 1317  NA 133*  K 3.3*  CL 101  CO2 22  GLUCOSE 118*  BUN 8  CREATININE 0.48  CALCIUM 8.3*   Liver Function Tests: Recent Labs  Lab 03/12/21 1317  AST 51*  ALT 33  ALKPHOS 148*  BILITOT 0.6  PROT 6.6  ALBUMIN 2.7*   No results for  input(s): LIPASE, AMYLASE in the last 168 hours. No results for input(s): AMMONIA in the last 168 hours. CBC: Recent Labs  Lab 03/12/21 1317  WBC 4.9  NEUTROABS 3.1  HGB 6.5*  HCT 23.6*  MCV 80.8  PLT 176   Cardiac Enzymes: No results for input(s): CKTOTAL, CKMB, CKMBINDEX, TROPONINI in the last 168 hours.  BNP (last 3 results) No results for input(s): PROBNP in the last 8760 hours. CBG: No results for input(s): GLUCAP in the last 168 hours.  Urinalysis    Component Value Date/Time   COLORURINE AMBER (A) 03/05/2019 0318   APPEARANCEUR CLOUDY (A) 03/05/2019 0318   LABSPEC 1.018 03/05/2019 4403  PHURINE 5.0 03/05/2019 0318   GLUCOSEU NEGATIVE 03/05/2019 0318   HGBUR NEGATIVE 03/05/2019 0318   BILIRUBINUR NEGATIVE 03/05/2019 0318   KETONESUR 20 (A) 03/05/2019 0318   PROTEINUR NEGATIVE 03/05/2019 0318   NITRITE NEGATIVE 03/05/2019 0318   LEUKOCYTESUR NEGATIVE 03/05/2019 0318      Radiographic Studies: US Venous Img Lower Bilateral  Result Date: 03/12/2021 CLINICAL DATA:  35 year old female with bilateral lower extremity pain and swelling over the past 2 weeks. EXAM: BILATERAL LOWER EXTREMITY VENOUS DOPPLER ULTRASOUND TECHNIQUE: Gray-scale sonography with graded compression, as well as color Doppler and duplex ultrasound were performed to evaluate the lower extremity deep venous systems from the level of the common femoral vein and including the common femoral, femoral, profunda femoral, popliteal and calf veins including the posterior tibial, peroneal and gastrocnemius veins when visible. The superficial great saphenous vein was also interrogated. Spectral Doppler was utilized to evaluate flow at rest and with distal augmentation maneuvers in the common femoral, femoral and popliteal veins. COMPARISON:  None. FINDINGS: RIGHT LOWER EXTREMITY Common Femoral Vein: No evidence of thrombus. Normal compressibility, respiratory phasicity and response to augmentation. Saphenofemoral  Junction: No evidence of thrombus. Normal compressibility and flow on color Doppler imaging. Profunda Femoral Vein: No evidence of thrombus. Normal compressibility and flow on color Doppler imaging. Femoral Vein: No evidence of thrombus. Normal compressibility, respiratory phasicity and response to augmentation. Popliteal Vein: No evidence of thrombus. Normal compressibility, respiratory phasicity and response to augmentation. Calf Veins: No evidence of thrombus. Normal compressibility and flow on color Doppler imaging. Other Findings:  None. LEFT LOWER EXTREMITY Common Femoral Vein: No evidence of thrombus. Normal compressibility, respiratory phasicity and response to augmentation. Saphenofemoral Junction: No evidence of thrombus. Normal compressibility and flow on color Doppler imaging. Profunda Femoral Vein: No evidence of thrombus. Normal compressibility and flow on color Doppler imaging. Femoral Vein: No evidence of thrombus. Normal compressibility, respiratory phasicity and response to augmentation. Popliteal Vein: No evidence of thrombus. Normal compressibility, respiratory phasicity and response to augmentation. Calf Veins: No evidence of thrombus. Normal compressibility and flow on color Doppler imaging. Other Findings:  None. IMPRESSION: No evidence of bilateral lower extremity deep venous thrombosis. Marliss Coots, MD [facility] Electronically Signed   By: Marliss Coots MD   On: 03/12/2021 14:16    EKG: Independently reviewed by me.  Sinus tachycardia   Assessment/Plan:   Active Problems:   Severe anemia   Hypokalemia   There is no height or weight on file to calculate BMI.    Severe symptomatic anemia, iron deficiency anemia: Admit to MedSurg.  This is probably from chronic.  Needs of packed red blood cells for transfusion as already been ordered by ED provider.  Repeat H&H following blood transfusion.  She may need IV iron  ordered a blood transfusion depending on repeat H&H.  Check vitamin B12 level. Consult gastroenterologist to assist with evaluation of iron deficiency anemia.  Hypokalemia and hypomagnesemia: Replete potassium and magnesium.  Monitor levels.  Cessation of menstrual period for over 2 years, ? Premature menopause: Outpatient follow-up with gynecologist recommended.   Other information:   DVT prophylaxis: SCD's  Code Status: Full code. Family Communication: None Disposition Plan: Home in 1 to 2 days Consults called: Gastroenterologist Admission status: Observation  The medical decision making on this patient was of high complexity and the patient is at high risk for clinical deterioration, therefore this is a level 3 visit.    Kassady Laboy [facility] Pager:  Please check [email or web address]   How to contact the [facility] Attending or Consulting provider 7A - 7P or covering provider during after hours 7P -7A, for this patient?   1. Check the care team in [facility] and look for a) attending/consulting [facility] provider listed and b) the [facility] team listed 2. Log into [email or web address] and use [place]'s universal password to access. If you do not have the password, please contact the hospital operator. 3. Locate the [facility] provider you are looking for under [facility] and page to a number that you can be directly reached. 4. If you still have difficulty reaching the provider, please page the [facility] (Director on Call) for the Hospitalists listed on amion for assistance.  03/12/2021, 4:54 PM

## 2021-03-12 NOTE — Progress Notes (Signed)
Received pt from the ED, transported via stretcher, accompanied by RN. Second unit of blood just completed. Pt is aox4, c/o of numbness and decreased sensation on bil legs which was a chronic problem for her. IVF started and IV magnesium offered. Box meal provided. Call bell placed within reach. Orientation given. Will monitor.

## 2021-03-12 NOTE — ED Triage Notes (Addendum)
Pt comes with c/o cellulitis in bilateral legs. Pt states pain in both leg and swelling. Pt currently has no swelling or redness. Pt states this has been going on for about a year. Pt states it come and goes.  Pt afraid of DVT and that is why she came today.  Pt states she googled her symptoms and wanted to get checked out. Pt appears pale in appearance at this time. Pt denies any other symptoms.

## 2021-03-13 ENCOUNTER — Inpatient Hospital Stay: Payer: Self-pay

## 2021-03-13 DIAGNOSIS — D649 Anemia, unspecified: Secondary | ICD-10-CM | POA: Diagnosis present

## 2021-03-13 DIAGNOSIS — D500 Iron deficiency anemia secondary to blood loss (chronic): Secondary | ICD-10-CM

## 2021-03-13 DIAGNOSIS — E876 Hypokalemia: Secondary | ICD-10-CM

## 2021-03-13 LAB — BASIC METABOLIC PANEL
Anion gap: 7 (ref 5–15)
BUN: 7 mg/dL (ref 6–20)
CO2: 25 mmol/L (ref 22–32)
Calcium: 8.5 mg/dL — ABNORMAL LOW (ref 8.9–10.3)
Chloride: 103 mmol/L (ref 98–111)
Creatinine, Ser: 0.45 mg/dL (ref 0.44–1.00)
GFR, Estimated: >60 mL/min (ref >60)
Glucose, Bld: 117 mg/dL — ABNORMAL HIGH (ref 70–99)
Potassium: 4.3 mmol/L (ref 3.5–5.1)
Sodium: 135 mmol/L (ref 135–145)

## 2021-03-13 LAB — BPAM RBC
Blood Product Expiration Date: 202205172359
Blood Product Expiration Date: 202206112359
ISSUE DATE / TIME: 202205171618
ISSUE DATE / TIME: 202205171620
Unit Type and Rh: 6200
Unit Type and Rh: 9500

## 2021-03-13 LAB — TYPE AND SCREEN
ABO/RH(D): A POS
Antibody Screen: NEGATIVE
Unit division: 0
Unit division: 0

## 2021-03-13 LAB — CBC
HCT: 30.5 % — ABNORMAL LOW (ref 36.0–46.0)
Hemoglobin: 9 g/dL — ABNORMAL LOW (ref 12.0–15.0)
MCH: 24.2 pg — ABNORMAL LOW (ref 26.0–34.0)
MCHC: 29.5 g/dL — ABNORMAL LOW (ref 30.0–36.0)
MCV: 82 fL (ref 80.0–100.0)
Platelets: 171 10*3/uL (ref 150–400)
RBC: 3.72 MIL/uL — ABNORMAL LOW (ref 3.87–5.11)
RDW: 22.4 % — ABNORMAL HIGH (ref 11.5–15.5)
WBC: 3.6 10*3/uL — ABNORMAL LOW (ref 4.0–10.5)
nRBC: 0 % (ref 0.0–0.2)

## 2021-03-13 LAB — MAGNESIUM: Magnesium: 2.1 mg/dL (ref 1.7–2.4)

## 2021-03-13 LAB — TSH: TSH: 1.625 u[IU]/mL (ref 0.350–4.500)

## 2021-03-13 LAB — T4, FREE: Free T4: 0.96 ng/dL (ref 0.61–1.12)

## 2021-03-13 LAB — VITAMIN B12: Vitamin B-12: 269 pg/mL (ref 180–914)

## 2021-03-13 LAB — HIV ANTIBODY (ROUTINE TESTING W REFLEX): HIV Screen 4th Generation wRfx: NONREACTIVE

## 2021-03-13 MED ORDER — PEG 3350-KCL-NA BICARB-NACL 420 G PO SOLR
4000.0000 mL | Freq: Once | ORAL | Status: AC
  Administered 2021-03-14: 4000 mL via ORAL
  Filled 2021-03-13: qty 4000

## 2021-03-13 MED ORDER — CYANOCOBALAMIN 1000 MCG/ML IJ SOLN
1000.0000 ug | Freq: Every day | INTRAMUSCULAR | Status: AC
  Administered 2021-03-13 – 2021-03-15 (×3): 1000 ug via INTRAMUSCULAR
  Filled 2021-03-13 (×3): qty 1

## 2021-03-13 MED ORDER — SODIUM CHLORIDE 0.9 % IV SOLN
300.0000 mg | Freq: Once | INTRAVENOUS | Status: AC
  Administered 2021-03-13: 10:00:00 300 mg via INTRAVENOUS
  Filled 2021-03-13: qty 15

## 2021-03-13 MED ORDER — BISACODYL 5 MG PO TBEC
10.0000 mg | DELAYED_RELEASE_TABLET | Freq: Once | ORAL | Status: AC
  Administered 2021-03-14: 10 mg via ORAL
  Filled 2021-03-13: qty 2

## 2021-03-13 NOTE — Progress Notes (Signed)
PROGRESS NOTE    HookertonBrittany Pearson  ZOX:[id number] DOB: 06-20-1986 DOA: 03/12/2021 PCP: Patient, No Pcp Per (Inactive)   Chief complaint.  Shortness of breath. Brief Narrative:  Sandra Pearson is a 35 y.o. female with medical history significant for hypertension, iron deficiency anemia, heavy menses many years ago (teenage years), tobacco use disorder.  She presented to the hospital because of shortness of breath, generalized weakness, easy fatigability and dizziness.    He has not had a menstrual period for 2 years, she does not have any rectal bleeding or black stools.  Her hemoglobin when she came to the hospital was 6.5, she received blood transfusion.   Assessment & Plan:   Active Problems:   Severe anemia   Hypokalemia   Hypomagnesemia  #1.  Severe iron deficient anemia. This appears to be secondary to mop as option, patient also had a weight loss of 10 pounds over the last year.  She has no evidence of GI bleed.  She received a PRBC transfusion, he will be also giving IV iron.  Her B12 level is borderline, will start B12 injection. Patient also been seen by GI, scheduled for EGD/colonoscopy on Friday. Patient has not had a period for 2 years, she will need to follow-up with OB/GYN as outpatient. However, due to significant weight loss, I will also obtain transvaginal ultrasound.  #2.  Hypokalemia. Hypomagnesemia. Recheck levels tomorrow.    DVT prophylaxis: SCDs Code Status: full Family Communication: Husband at the bedside. Disposition Plan:  .   Status is: Observation  The patient will require care spanning > 2 midnights and should be moved to inpatient because: Inpatient level of care appropriate due to severity of illness  Dispo: The patient is from: Home              Anticipated d/c is to: Home              Patient currently is not medically stable to d/c.   Difficult to place patient No        I/O last 3 completed shifts: In: 950.1 [P.O.:600; Blood:300;  IV Piggyback:50.1] Out: -  No intake/output data recorded.     Consultants:   GI  Procedures: None  Antimicrobials: None  Subjective: Patient doing better today after blood transfusion.  She no longer feels dizzy and palpitation. Denies any short of breath today, no cough. No chest pain. No dysuria hematuria pain No fever or chills.  Objective: Vitals:   03/13/21 0006 03/13/21 0442 03/13/21 0839 03/13/21 1224  BP: 112/69 124/85 121/84 125/86  Pulse: 81 77 82 75  Resp: 20 16 15 15   Temp: 98.3 F (36.8 C) 97.6 F (36.4 C) (!) 97.4 F (36.3 C) 98 F (36.7 C)  TempSrc:  Oral Oral   SpO2: 100% 100% 100% 100%    Intake/Output Summary (Last 24 hours) at 03/13/2021 1519 Last data filed at 03/13/2021 1355 Gross per 24 hour  Intake 950.07 ml  Output --  Net 950.07 ml   There were no vitals filed for this visit.  Examination:  General exam: Appears calm and comfortable  Respiratory system: Clear to auscultation. Respiratory effort normal. Cardiovascular system: S1 & S2 heard, RRR. No JVD, murmurs, rubs, gallops or clicks. No pedal edema. Gastrointestinal system: Abdomen is nondistended, soft and nontender. No organomegaly or masses felt. Normal bowel sounds heard. Central nervous system: Alert and oriented. No focal neurological deficits. Extremities: Symmetric 5 x 5 power. Skin: No rashes, lesions or ulcers Psychiatry:  Mood &  affect appropriate.     Data Reviewed: I have personally reviewed following labs and imaging studies  CBC: Recent Labs  Lab 03/12/21 1317 03/13/21 0330  WBC 4.9 3.6*  NEUTROABS 3.1  --   HGB 6.5* 9.0*  HCT 23.6* 30.5*  MCV 80.8 82.0  PLT 176 171   Basic Metabolic Panel: Recent Labs  Lab 03/12/21 1317 03/12/21 1450 03/13/21 0330  NA 133*  --  135  K 3.3*  --  4.3  CL 101  --  103  CO2 22  --  25  GLUCOSE 118*  --  117*  BUN 8  --  7  CREATININE 0.48  --  0.45  CALCIUM 8.3*  --  8.5*  MG  --  1.3* 2.1  PHOS  --  3.4  --     GFR: CrCl cannot be calculated (Unknown ideal weight.). Liver Function Tests: Recent Labs  Lab 03/12/21 1317  AST 51*  ALT 33  ALKPHOS 148*  BILITOT 0.6  PROT 6.6  ALBUMIN 2.7*   No results for input(s): LIPASE, AMYLASE in the last 168 hours. No results for input(s): AMMONIA in the last 168 hours. Coagulation Profile: Recent Labs  Lab 03/12/21 1450  INR 1.1   Cardiac Enzymes: No results for input(s): CKTOTAL, CKMB, CKMBINDEX, TROPONINI in the last 168 hours. BNP (last 3 results) No results for input(s): PROBNP in the last 8760 hours. HbA1C: No results for input(s): HGBA1C in the last 72 hours. CBG: No results for input(s): GLUCAP in the last 168 hours. Lipid Profile: No results for input(s): CHOL, HDL, LDLCALC, TRIG, CHOLHDL, LDLDIRECT in the last 72 hours. Thyroid Function Tests: Recent Labs    03/13/21 0330  TSH 1.625  FREET4 0.96   Anemia Panel: Recent Labs    03/12/21 1450 03/13/21 0330  VITAMINB12  --  269  FERRITIN 11  --   TIBC 515*  --   IRON 19*  --    Sepsis Labs: No results for input(s): PROCALCITON, LATICACIDVEN in the last 168 hours.  Recent Results (from the past 240 hour(s))  Resp Panel by RT-PCR (Flu A&B, Covid) Nasopharyngeal Swab     Status: None   Collection Time: 03/12/21  4:44 PM   Specimen: Nasopharyngeal Swab; Nasopharyngeal(NP) swabs in vial transport medium  Result Value Ref Range Status   SARS Coronavirus 2 by RT PCR NEGATIVE NEGATIVE Final    Comment: (NOTE) SARS-CoV-2 target nucleic acids are NOT DETECTED.  The SARS-CoV-2 RNA is generally detectable in upper respiratory specimens during the acute phase of infection. The lowest concentration of SARS-CoV-2 viral copies this assay can detect is 138 copies/mL. A negative result does not preclude SARS-Cov-2 infection and should not be used as the sole basis for treatment or other patient management decisions. A negative result may occur with  improper specimen  collection/handling, submission of specimen other than nasopharyngeal swab, presence of viral mutation(s) within the areas targeted by this assay, and inadequate number of viral copies(<138 copies/mL). A negative result must be combined with clinical observations, patient history, and epidemiological information. The expected result is Negative.  Fact Sheet for Patients:  BloggerCourse.com  Fact Sheet for Healthcare Providers:  SeriousBroker.it  This test is no t yet approved or cleared by the [place] FDA and  has been authorized for detection and/or diagnosis of SARS-CoV-2 by FDA under an Emergency Use Authorization (EUA). This EUA will remain  in effect (meaning this test can be used) for the duration of the COVID-19  declaration under Section 564(b)(1) of the Act, 21 U.S.C.section 360bbb-3(b)(1), unless the authorization is terminated  or revoked sooner.       Influenza A by PCR NEGATIVE NEGATIVE Final   Influenza B by PCR NEGATIVE NEGATIVE Final    Comment: (NOTE) The Xpert Xpress SARS-CoV-2/FLU/RSV plus assay is intended as an aid in the diagnosis of influenza from Nasopharyngeal swab specimens and should not be used as a sole basis for treatment. Nasal washings and aspirates are unacceptable for Xpert Xpress SARS-CoV-2/FLU/RSV testing.  Fact Sheet for Patients: BloggerCourse.com  Fact Sheet for Healthcare Providers: SeriousBroker.it  This test is not yet approved or cleared by the [place] FDA and has been authorized for detection and/or diagnosis of SARS-CoV-2 by FDA under an Emergency Use Authorization (EUA). This EUA will remain in effect (meaning this test can be used) for the duration of the COVID-19 declaration under Section 564(b)(1) of the Act, 21 U.S.C. section 360bbb-3(b)(1), unless the authorization is terminated or revoked.  Performed at [facility], [address] [place], [place] [address]          Radiology Studies: US Venous Img Lower Bilateral  Result Date: 03/12/2021 CLINICAL DATA:  35 year old female with bilateral lower extremity pain and swelling over the past 2 weeks. EXAM: BILATERAL LOWER EXTREMITY VENOUS DOPPLER ULTRASOUND TECHNIQUE: Gray-scale sonography with graded compression, as well as color Doppler and duplex ultrasound were performed to evaluate the lower extremity deep venous systems from the level of the common femoral vein and including the common femoral, femoral, profunda femoral, popliteal and calf veins including the posterior tibial, peroneal and gastrocnemius veins when visible. The superficial great saphenous vein was also interrogated. Spectral Doppler was utilized to evaluate flow at rest and with distal augmentation maneuvers in the common femoral, femoral and popliteal veins. COMPARISON:  None. FINDINGS: RIGHT LOWER EXTREMITY Common Femoral Vein: No evidence of thrombus. Normal compressibility, respiratory phasicity and response to augmentation. Saphenofemoral Junction: No evidence of thrombus. Normal compressibility and flow on color Doppler imaging. Profunda Femoral Vein: No evidence of thrombus. Normal compressibility and flow on color Doppler imaging. Femoral Vein: No evidence of thrombus. Normal compressibility, respiratory phasicity and response to augmentation. Popliteal Vein: No evidence of thrombus. Normal compressibility, respiratory phasicity and response to augmentation. Calf Veins: No evidence of thrombus. Normal compressibility and flow on color Doppler imaging. Other Findings:  None. LEFT LOWER EXTREMITY Common Femoral Vein: No evidence of thrombus. Normal compressibility, respiratory phasicity and response to augmentation. Saphenofemoral Junction: No evidence of thrombus. Normal compressibility and flow on color Doppler imaging. Profunda Femoral Vein: No evidence of thrombus. Normal  compressibility and flow on color Doppler imaging. Femoral Vein: No evidence of thrombus. Normal compressibility, respiratory phasicity and response to augmentation. Popliteal Vein: No evidence of thrombus. Normal compressibility, respiratory phasicity and response to augmentation. Calf Veins: No evidence of thrombus. Normal compressibility and flow on color Doppler imaging. Other Findings:  None. IMPRESSION: No evidence of bilateral lower extremity deep venous thrombosis. Marliss Coots, MD [facility] Radiology Electronically Signed   By: Marliss Coots MD   On: 03/12/2021 14:16        Scheduled Meds: . cyanocobalamin  1,000 mcg Intramuscular Daily   Continuous Infusions:   LOS: 0 days    Time spent: 27 minutes    Marrion Coy, MD [facility]   To contact the attending provider between 7A-7P or the covering provider during after hours 7P-7A, please log into the web site [email or web address]  and access using universal [place] password for that web site. If you do not have the password, please call the hospital operator.  03/13/2021, 3:19 PM

## 2021-03-13 NOTE — Progress Notes (Signed)
Patient states she is having burning pain in feet, mainly right. Tylenol given. Patient states it helped a little bit, but still burning. Will notify the MD

## 2021-03-13 NOTE — Plan of Care (Signed)

## 2021-03-13 NOTE — Consult Note (Signed)
Melodie Bouillon, MD [address], [address], [place], [place], [address] [address], [address], [place], [place], [address] Phone: [phone]  Fax: [phone]  Consultation  Referring Provider:     Dr. Chipper Herb Primary Care Physician:  Patient, No Pcp Per (Inactive) Reason for Consultation:    Iron deficiency anemia  Date of Admission:  03/12/2021 Date of Consultation:  03/13/2021         HPI:   Sandra Pearson is a 35 y.o. female presented to the hospital for shortness of breath, weakness, fatigue and was found to have iron deficiency anemia.  Patient denies any episodes of active bleeding.  She states she has been told that she is anemic since she was 35 years old but this was attributed to heavy menses initially.  She has not had any menses for 2 years.  No prior EGD or colonoscopy.  She was evaluated over 2 years ago by [facility] GI for iron deficiency anemia and EGD and colonoscopy were recommended but patient never got these done.  The patient denies abdominal or flank pain, anorexia, nausea or vomiting, dysphagia, change in bowel habits or black or bloody stools or weight loss.   Past Medical History:  Diagnosis Date  . Anemia   . Hypertension     Past Surgical History:  Procedure Laterality Date  . MANDIBLE FRACTURE SURGERY      Prior to Admission medications   Medication Sig Start Date End Date Taking? Authorizing Provider  clindamycin (CLEOCIN) 300 MG capsule Take 1 capsule (300 mg total) by mouth 3 (three) times daily. Patient not taking: No sig reported 03/18/20   Irean Hong, MD  folic acid (FOLVITE) 1 MG tablet Take 1 tablet (1 mg total) by mouth daily. Patient not taking: No sig reported 02/18/19   Triplett, Rulon Eisenmenger B, FNP  gabapentin (NEURONTIN) 100 MG capsule Take 1 capsule (100 mg total) by mouth 3 (three) times daily as needed. 02/18/19 05/19/19  Triplett, Rulon Eisenmenger B, FNP  ondansetron (ZOFRAN) 4 MG tablet Take 1 tablet (4 mg total) by mouth every 8 (eight)  hours as needed for nausea or vomiting. Patient not taking: No sig reported 02/18/19   Kem Boroughs B, FNP  oxyCODONE-acetaminophen (PERCOCET/ROXICET) 5-325 MG tablet Take 1 tablet by mouth every 4 (four) hours as needed for severe pain. Patient not taking: No sig reported 03/18/20   Irean Hong, MD  promethazine (PHENERGAN) 25 MG tablet Take 1 tablet (25 mg total) by mouth every 6 (six) hours as needed for nausea or vomiting. Patient not taking: No sig reported 03/06/19   Minna Antis, MD  traMADol (ULTRAM) 50 MG tablet Take 1 tablet (50 mg total) by mouth every 6 (six) hours as needed. Patient not taking: No sig reported 02/18/19   Chinita Pester, FNP    Family History  Problem Relation Age of Onset  . Hepatitis C Mother   . Heart disease Mother      Social History   Tobacco Use  . Smoking status: Current Every Day Smoker    Packs/day: 1.00    Types: Cigarettes  . Smokeless tobacco: Never Used  Vaping Use  . Vaping Use: Some days  Substance Use Topics  . Alcohol use: No    Comment: rarely   . Drug use: No    Allergies as of 03/12/2021  . (No Known Allergies)    Review of Systems:    All systems reviewed and negative except where noted in HPI.  Physical Exam:  Vital signs in last 24 hours: Vitals:   03/13/21 0006 03/13/21 0442 03/13/21 0839 03/13/21 1224  BP: 112/69 124/85 121/84 125/86  Pulse: 81 77 82 75  Resp: 20 16 15 15   Temp: 98.3 F (36.8 C) 97.6 F (36.4 C) (!) 97.4 F (36.3 C) 98 F (36.7 C)  TempSrc:  Oral Oral   SpO2: 100% 100% 100% 100%   Last BM Date: 03/12/21 General:   Pleasant, cooperative in NAD Head:  Normocephalic and atraumatic. Eyes:   No icterus.   Conjunctiva pink. PERRLA. Ears:  Normal auditory acuity. Neck:  Supple; no masses or thyroidomegaly Lungs: Respirations even and unlabored. Lungs clear to auscultation bilaterally.   No wheezes, crackles, or rhonchi.  Abdomen:  Soft, nondistended, nontender. Normal bowel sounds. No  appreciable masses or hepatomegaly.  No rebound or guarding.  Neurologic:  Alert and oriented x3;  grossly normal neurologically. Skin:  Intact without significant lesions or rashes. Cervical Nodes:  No significant cervical adenopathy. Psych:  Alert and cooperative. Normal affect.  LAB RESULTS: Recent Labs    03/12/21 1317 03/13/21 0330  WBC 4.9 3.6*  HGB 6.5* 9.0*  HCT 23.6* 30.5*  PLT 176 171   BMET Recent Labs    03/12/21 1317 03/13/21 0330  NA 133* 135  K 3.3* 4.3  CL 101 103  CO2 22 25  GLUCOSE 118* 117*  BUN 8 7  CREATININE 0.48 0.45  CALCIUM 8.3* 8.5*   LFT Recent Labs    03/12/21 1317  PROT 6.6  ALBUMIN 2.7*  AST 51*  ALT 33  ALKPHOS 148*  BILITOT 0.6   PT/INR Recent Labs    03/12/21 1450  LABPROT 14.0  INR 1.1    STUDIES: 03/14/21 Venous Img Lower Bilateral  Result Date: 03/12/2021 CLINICAL DATA:  35 year old female with bilateral lower extremity pain and swelling over the past 2 weeks. EXAM: BILATERAL LOWER EXTREMITY VENOUS DOPPLER ULTRASOUND TECHNIQUE: Gray-scale sonography with graded compression, as well as color Doppler and duplex ultrasound were performed to evaluate the lower extremity deep venous systems from the level of the common femoral vein and including the common femoral, femoral, profunda femoral, popliteal and calf veins including the posterior tibial, peroneal and gastrocnemius veins when visible. The superficial great saphenous vein was also interrogated. Spectral Doppler was utilized to evaluate flow at rest and with distal augmentation maneuvers in the common femoral, femoral and popliteal veins. COMPARISON:  None. FINDINGS: RIGHT LOWER EXTREMITY Common Femoral Vein: No evidence of thrombus. Normal compressibility, respiratory phasicity and response to augmentation. Saphenofemoral Junction: No evidence of thrombus. Normal compressibility and flow on color Doppler imaging. Profunda Femoral Vein: No evidence of thrombus. Normal compressibility  and flow on color Doppler imaging. Femoral Vein: No evidence of thrombus. Normal compressibility, respiratory phasicity and response to augmentation. Popliteal Vein: No evidence of thrombus. Normal compressibility, respiratory phasicity and response to augmentation. Calf Veins: No evidence of thrombus. Normal compressibility and flow on color Doppler imaging. Other Findings:  None. LEFT LOWER EXTREMITY Common Femoral Vein: No evidence of thrombus. Normal compressibility, respiratory phasicity and response to augmentation. Saphenofemoral Junction: No evidence of thrombus. Normal compressibility and flow on color Doppler imaging. Profunda Femoral Vein: No evidence of thrombus. Normal compressibility and flow on color Doppler imaging. Femoral Vein: No evidence of thrombus. Normal compressibility, respiratory phasicity and response to augmentation. Popliteal Vein: No evidence of thrombus. Normal compressibility, respiratory phasicity and response to augmentation. Calf Veins: No evidence of thrombus. Normal compressibility and flow on  color Doppler imaging. Other Findings:  None. IMPRESSION: No evidence of bilateral lower extremity deep venous thrombosis. Marliss Coots, MD [facility] Radiology Electronically Signed   By: Marliss Coots MD   On: 03/12/2021 14:16      Impression / Plan:   Sandra Pearson is a 35 y.o. y/o female with iron deficiency anemia but no signs of active GI bleeding  EGD and colonoscopy indicated for iron deficiency anemia  Patient consumes a regular diet all day today.  Therefore, colonoscopy prep will need to be started tomorrow along with a clear liquid diet tomorrow and colonoscopy and EGD for iron deficiency anemia to be done on subsequent day  Would recommend IV iron replacement as an inpatient and outpatient  I have discussed alternative options, risks & benefits,  which include, but are not limited to, bleeding, infection,  perforation,respiratory complication & drug reaction.  The patient agrees with this plan & written consent will be obtained.     Thank you for involving me in the care of this patient.      LOS: 0 days   Pasty Spillers, MD  03/13/2021, 3:34 PM

## 2021-03-14 DIAGNOSIS — D509 Iron deficiency anemia, unspecified: Principal | ICD-10-CM

## 2021-03-14 LAB — CBC WITH DIFFERENTIAL/PLATELET
Abs Immature Granulocytes: 0.01 10*3/uL (ref 0.00–0.07)
Basophils Absolute: 0.1 10*3/uL (ref 0.0–0.1)
Basophils Relative: 2 %
Eosinophils Absolute: 0.2 10*3/uL (ref 0.0–0.5)
Eosinophils Relative: 5 %
HCT: 30.6 % — ABNORMAL LOW (ref 36.0–46.0)
Hemoglobin: 8.7 g/dL — ABNORMAL LOW (ref 12.0–15.0)
Immature Granulocytes: 0 %
Lymphocytes Relative: 34 %
Lymphs Abs: 1.3 10*3/uL (ref 0.7–4.0)
MCH: 23.8 pg — ABNORMAL LOW (ref 26.0–34.0)
MCHC: 28.4 g/dL — ABNORMAL LOW (ref 30.0–36.0)
MCV: 83.6 fL (ref 80.0–100.0)
Monocytes Absolute: 0.6 10*3/uL (ref 0.1–1.0)
Monocytes Relative: 14 %
Neutro Abs: 1.8 10*3/uL (ref 1.7–7.7)
Neutrophils Relative %: 45 %
Platelets: 192 10*3/uL (ref 150–400)
RBC: 3.66 MIL/uL — ABNORMAL LOW (ref 3.87–5.11)
RDW: 23 % — ABNORMAL HIGH (ref 11.5–15.5)
Smear Review: NORMAL
WBC: 3.9 10*3/uL — ABNORMAL LOW (ref 4.0–10.5)
nRBC: 0 % (ref 0.0–0.2)

## 2021-03-14 LAB — BASIC METABOLIC PANEL
Anion gap: 5 (ref 5–15)
BUN: 5 mg/dL — ABNORMAL LOW (ref 6–20)
CO2: 24 mmol/L (ref 22–32)
Calcium: 8.6 mg/dL — ABNORMAL LOW (ref 8.9–10.3)
Chloride: 108 mmol/L (ref 98–111)
Creatinine, Ser: 0.5 mg/dL (ref 0.44–1.00)
GFR, Estimated: >60 mL/min (ref >60)
Glucose, Bld: 114 mg/dL — ABNORMAL HIGH (ref 70–99)
Potassium: 4.8 mmol/L (ref 3.5–5.1)
Sodium: 137 mmol/L (ref 135–145)

## 2021-03-14 LAB — MAGNESIUM: Magnesium: 1.8 mg/dL (ref 1.7–2.4)

## 2021-03-14 LAB — HOMOCYSTEINE: Homocysteine: 20.4 umol/L — ABNORMAL HIGH (ref 0.0–14.5)

## 2021-03-14 NOTE — Progress Notes (Signed)
Melodie Bouillon, MD [address], [place], [place], [address] [address], [place], [place], [address] Phone: [phone]  Fax: [phone]   Subjective:  Patient sitting up in bed comfortably.  Denies any abdominal pain.  No nausea or vomiting.  No episodes of active bleeding.  Objective: Exam: Vital signs in last 24 hours: Vitals:   03/13/21 2300 03/14/21 0400 03/14/21 0747 03/14/21 1129  BP: 115/89 118/78 116/85 116/70  Pulse: 79 83 87 82  Resp: 20 20 17 17   Temp: 97.9 F (36.6 C) 97.9 F (36.6 C) 98 F (36.7 C) 98.8 F (37.1 C)  TempSrc: Oral Oral    SpO2: 99% 98% 100% 98%   Weight change:   Intake/Output Summary (Last 24 hours) at 03/14/2021 1601 Last data filed at 03/14/2021 1013 Gross per 24 hour  Intake 600 ml  Output --  Net 600 ml    General: No acute distress, AAO x3 Abd: Soft, NT/ND, No HSM Skin: Warm, no rashes Neck: Supple, Trachea midline   Lab Results: Lab Results  Component Value Date   WBC 3.9 (L) 03/14/2021   HGB 8.7 (L) 03/14/2021   HCT 30.6 (L) 03/14/2021   MCV 83.6 03/14/2021   PLT 192 03/14/2021   Micro Results: Recent Results (from the past 240 hour(s))  Resp Panel by RT-PCR (Flu A&B, Covid) Nasopharyngeal Swab     Status: None   Collection Time: 03/12/21  4:44 PM   Specimen: Nasopharyngeal Swab; Nasopharyngeal(NP) swabs in vial transport medium  Result Value Ref Range Status   SARS Coronavirus 2 by RT PCR NEGATIVE NEGATIVE Final    Comment: (NOTE) SARS-CoV-2 target nucleic acids are NOT DETECTED.  The SARS-CoV-2 RNA is generally detectable in upper respiratory specimens during the acute phase of infection. The lowest concentration of SARS-CoV-2 viral copies this assay can detect is 138 copies/mL. A negative result does not preclude SARS-Cov-2 infection and should not be used as the sole basis for treatment or other patient management decisions. A negative result may occur with  improper specimen  collection/handling, submission of specimen other than nasopharyngeal swab, presence of viral mutation(s) within the areas targeted by this assay, and inadequate number of viral copies(<138 copies/mL). A negative result must be combined with clinical observations, patient history, and epidemiological information. The expected result is Negative.  Fact Sheet for Patients:  03/14/21  Fact Sheet for Healthcare Providers:  BloggerCourse.com  This test is no t yet approved or cleared by the [place] FDA and  has been authorized for detection and/or diagnosis of SARS-CoV-2 by FDA under an Emergency Use Authorization (EUA). This EUA will remain  in effect (meaning this test can be used) for the duration of the COVID-19 declaration under Section 564(b)(1) of the Act, 21 U.S.C.section 360bbb-3(b)(1), unless the authorization is terminated  or revoked sooner.       Influenza A by PCR NEGATIVE NEGATIVE Final   Influenza B by PCR NEGATIVE NEGATIVE Final    Comment: (NOTE) The Xpert Xpress SARS-CoV-2/FLU/RSV plus assay is intended as an aid in the diagnosis of influenza from Nasopharyngeal swab specimens and should not be used as a sole basis for treatment. Nasal washings and aspirates are unacceptable for Xpert Xpress SARS-CoV-2/FLU/RSV testing.  Fact Sheet for Patients: [place]  Fact Sheet for Healthcare Providers: BloggerCourse.com  This test is not yet approved or cleared by the [place] FDA and has been authorized for detection and/or diagnosis of SARS-CoV-2 by FDA under an Emergency Use Authorization (  EUA). This EUA will remain in effect (meaning this test can be used) for the duration of the COVID-19 declaration under Section 564(b)(1) of the Act, 21 U.S.C. section 360bbb-3(b)(1), unless the authorization is terminated or revoked.  Performed at [facility], [address] [place], [place] [address]    Studies/Results: US PELVIC COMPLETE WITH TRANSVAGINAL  Result Date: 03/13/2021 CLINICAL DATA:  Acute anemia EXAM: TRANSABDOMINAL AND TRANSVAGINAL ULTRASOUND OF PELVIS TECHNIQUE: Both transabdominal and transvaginal ultrasound examinations of the pelvis were performed. Transabdominal technique was performed for global imaging of the pelvis including uterus, ovaries, adnexal regions, and pelvic cul-de-sac. It was necessary to proceed with endovaginal exam following the transabdominal exam to visualize the uterus, endometrium, and ovaries. COMPARISON:  Ultrasound 02/05/2008 FINDINGS: Uterus Measurements: 4.2 x 2.8 x 3.3 cm = volume: 20.4 mL. Retroverted uterus. No concerning uterine mass or discernible fibroid. Endometrium Thickness: 3.3 mm, non thickened.  No focal abnormality visualized. Right ovary Measurements: 3.6 x 2.5 x 2.7 cm = volume: 12.5 mL. Multiple anechoic follicles. Slightly more complex 1.6 cm cyst with reticular echoes suggesting a hemorrhagic cyst. No concerning adnexal mass. Left ovary Measurements: 2.6 x 1.8 x 2.2 cm = volume: 5.4 mL. Multiple anechoic follicles. No concerning adnexal mass. Other findings There is trace free fluid adjacent the right ovary with some low level internal echoes which could reflect rupture of a hemorrhagic cyst. IMPRESSION: Multiple simple appearing follicles seen in both ovaries. A slightly more complex 1.6 cm lobular cyst with internal echogenicity suggestive of a hemorrhagic cyst is seen in the right adnexa. Only trace free fluid adjacent the right ovary with some low level internal echoes (45/72) can reflect small volume hemorrhage such as from hemorrhagic cyst rupture. Electronically Signed   By: Kreg Shropshire M.D.   On: 03/13/2021 19:16   Medications:  Scheduled Meds: . cyanocobalamin  1,000 mcg Intramuscular Daily   Continuous Infusions: PRN Meds:.acetaminophen **OR** acetaminophen,  ondansetron **OR** ondansetron (ZOFRAN) IV, polyethylene glycol   Assessment: Active Problems:   Severe anemia   Hypokalemia   Hypomagnesemia   Acute anemia    Plan: Patient willing to proceed with EGD and colonoscopy for further evaluation of iron deficiency anemia  Colonoscopy prep ordered.  Patient receiving IV iron  No evidence of active GI bleeding.  Hemoglobin improved since admission  I have discussed alternative options, risks & benefits,  which include, but are not limited to, bleeding, infection, perforation,respiratory complication & drug reaction.  The patient agrees with this plan & written consent will be obtained.      LOS: 1 day   Melodie Bouillon, MD 03/14/2021, 4:01 PM

## 2021-03-14 NOTE — Progress Notes (Signed)
PROGRESS NOTE    [place]  BSJ:[id number] DOB: [birth year] DOA: 03/12/2021 PCP: Patient, No Pcp Per (Inactive)   Chief complaint.  Shortness of breath Brief Narrative:  Sandra Durhamis a 35 y.o.femalewith medical history significant for hypertension, iron deficiency anemia, heavy menses many years ago (teenage years), tobacco use disorder. She presented to the hospital because of shortness of breath, generalized weakness, easy fatigability and dizziness.   He has not had a menstrual period for 2 years, she does not have any rectal bleeding or black stools.  Her hemoglobin when she came to the hospital was 6.5, she received blood transfusion.   Assessment & Plan:   Active Problems:   Severe anemia   Hypokalemia   Hypomagnesemia   Acute anemia  #1.  Severe iron deficient anemia. Borderline B12 level pending homocystine. Anemia most likely due to malabsorption, no evidence of GI bleed.  Received IV iron, also receiving B12 shots. Patient is scheduled for EGD/colonoscopy tomorrow. Due to weight loss and early menopausal, transvaginal ultrasound was obtained, showed some hemorrhagic cyst. Patient is still recommended to be seen by her OB/GYN as outpatient.  2.  Hypokalemia. Hypomagnesemia Condition improved.    DVT prophylaxis: SCDs Code Status: Full Family Communication:  Disposition Plan:  .   Status is: Inpatient  Remains inpatient appropriate because:Inpatient level of care appropriate due to severity of illness   Dispo: The patient is from: Home              Anticipated d/c is to: Home              Patient currently is not medically stable to d/c.   Difficult to place patient No        I/O last 3 completed shifts: In: 1010.1 [P.O.:960; IV Piggyback:50.1] Out: -  Total I/O In: 240 [P.O.:240] Out: -      Consultants:   GI  Procedures: None Antimicrobials:None  Subjective: Patient doing well today, she does not have any abdominal pain or  nausea vomiting. No fever or chills. No short of breath or cough. No dysuria hematuria.  Objective: Vitals:   03/13/21 2013 03/13/21 2300 03/14/21 0400 03/14/21 0747  BP: 134/87 115/89 118/78 116/85  Pulse: 82 79 83 87  Resp: 20 20 20 17   Temp: 98.4 F (36.9 C) 97.9 F (36.6 C) 97.9 F (36.6 C) 98 F (36.7 C)  TempSrc: Oral Oral Oral   SpO2: 100% 99% 98% 100%    Intake/Output Summary (Last 24 hours) at 03/14/2021 1111 Last data filed at 03/14/2021 1013 Gross per 24 hour  Intake 600 ml  Output --  Net 600 ml   There were no vitals filed for this visit.  Examination:  General exam: Appears calm and comfortable  Respiratory system: Clear to auscultation. Respiratory effort normal. Cardiovascular system: S1 & S2 heard, RRR. No JVD, murmurs, rubs, gallops or clicks. No pedal edema. Gastrointestinal system: Abdomen is nondistended, soft and nontender. No organomegaly or masses felt. Normal bowel sounds heard. Central nervous system: Alert and oriented. No focal neurological deficits. Extremities: Symmetric 5 x 5 power. Skin: No rashes, lesions or ulcers Psychiatry: Judgement and insight appear normal. Mood & affect appropriate.     Data Reviewed: I have personally reviewed following labs and imaging studies  CBC: Recent Labs  Lab 03/12/21 1317 03/13/21 0330 03/14/21 0411  WBC 4.9 3.6* 3.9*  NEUTROABS 3.1  --  1.8  HGB 6.5* 9.0* 8.7*  HCT 23.6* 30.5* 30.6*  MCV 80.8 82.0 83.6  PLT 176 171 192   Basic Metabolic Panel: Recent Labs  Lab 03/12/21 1317 03/12/21 1450 03/13/21 0330 03/14/21 0411  NA 133*  --  135 137  K 3.3*  --  4.3 4.8  CL 101  --  103 108  CO2 22  --  25 24  GLUCOSE 118*  --  117* 114*  BUN 8  --  7 5*  CREATININE 0.48  --  0.45 0.50  CALCIUM 8.3*  --  8.5* 8.6*  MG  --  1.3* 2.1 1.8  PHOS  --  3.4  --   --    GFR: CrCl cannot be calculated (Unknown ideal weight.). Liver Function Tests: Recent Labs  Lab 03/12/21 1317  AST 51*  ALT  33  ALKPHOS 148*  BILITOT 0.6  PROT 6.6  ALBUMIN 2.7*   No results for input(s): LIPASE, AMYLASE in the last 168 hours. No results for input(s): AMMONIA in the last 168 hours. Coagulation Profile: Recent Labs  Lab 03/12/21 1450  INR 1.1   Cardiac Enzymes: No results for input(s): CKTOTAL, CKMB, CKMBINDEX, TROPONINI in the last 168 hours. BNP (last 3 results) No results for input(s): PROBNP in the last 8760 hours. HbA1C: No results for input(s): HGBA1C in the last 72 hours. CBG: No results for input(s): GLUCAP in the last 168 hours. Lipid Profile: No results for input(s): CHOL, HDL, LDLCALC, TRIG, CHOLHDL, LDLDIRECT in the last 72 hours. Thyroid Function Tests: Recent Labs    03/13/21 0330  TSH 1.625  FREET4 0.96   Anemia Panel: Recent Labs    03/12/21 1450 03/13/21 0330  VITAMINB12  --  269  FERRITIN 11  --   TIBC 515*  --   IRON 19*  --    Sepsis Labs: No results for input(s): PROCALCITON, LATICACIDVEN in the last 168 hours.  Recent Results (from the past 240 hour(s))  Resp Panel by RT-PCR (Flu A&B, Covid) Nasopharyngeal Swab     Status: None   Collection Time: 03/12/21  4:44 PM   Specimen: Nasopharyngeal Swab; Nasopharyngeal(NP) swabs in vial transport medium  Result Value Ref Range Status   SARS Coronavirus 2 by RT PCR NEGATIVE NEGATIVE Final    Comment: (NOTE) SARS-CoV-2 target nucleic acids are NOT DETECTED.  The SARS-CoV-2 RNA is generally detectable in upper respiratory specimens during the acute phase of infection. The lowest concentration of SARS-CoV-2 viral copies this assay can detect is 138 copies/mL. A negative result does not preclude SARS-Cov-2 infection and should not be used as the sole basis for treatment or other patient management decisions. A negative result may occur with  improper specimen collection/handling, submission of specimen other than nasopharyngeal swab, presence of viral mutation(s) within the areas targeted by this assay,  and inadequate number of viral copies(<138 copies/mL). A negative result must be combined with clinical observations, patient history, and epidemiological information. The expected result is Negative.  Fact Sheet for Patients:  BloggerCourse.com  Fact Sheet for Healthcare Providers:  [employer]ker.[place]  This test is no t yet approved or cleared by the [place] FDA and  has been authorized for detection and/or diagnosis of SARS-CoV-2 by FDA under an Emergency Use Authorization (EUA). This EUA will remain  in effect (meaning this test can be used) for the duration of the COVID-19 declaration under Section 564(b)(1) of the Act, 21 U.S.C.section 360bbb-3(b)(1), unless the authorization is terminated  or revoked sooner.       Influenza A by PCR NEGATIVE NEGATIVE Final   Influenza  B by PCR NEGATIVE NEGATIVE Final    Comment: (NOTE) The Xpert Xpress SARS-CoV-2/FLU/RSV plus assay is intended as an aid in the diagnosis of influenza from Nasopharyngeal swab specimens and should not be used as a sole basis for treatment. Nasal washings and aspirates are unacceptable for Xpert Xpress SARS-CoV-2/FLU/RSV testing.  Fact Sheet for Patients: BloggerCourse.comhttps://www.fda.gov/media/152166/download  Fact Sheet for Healthcare Providers: SeriousBroker.ithttps://www.fda.gov/media/152162/download  This test is not yet approved or cleared by the [place] FDA and has been authorized for detection and/or diagnosis of SARS-CoV-2 by FDA under an Emergency Use Authorization (EUA). This EUA will remain in effect (meaning this test can be used) for the duration of the COVID-19 declaration under Section 564(b)(1) of the Act, 21 U.S.C. section 360bbb-3(b)(1), unless the authorization is terminated or revoked.  Performed at [facility], [address] [place], [place] [address]          Radiology Studies: US Venous Img Lower Bilateral  Result Date:  03/12/2021 CLINICAL DATA:  35 year old female with bilateral lower extremity pain and swelling over the past 2 weeks. EXAM: BILATERAL LOWER EXTREMITY VENOUS DOPPLER ULTRASOUND TECHNIQUE: Gray-scale sonography with graded compression, as well as color Doppler and duplex ultrasound were performed to evaluate the lower extremity deep venous systems from the level of the common femoral vein and including the common femoral, femoral, profunda femoral, popliteal and calf veins including the posterior tibial, peroneal and gastrocnemius veins when visible. The superficial great saphenous vein was also interrogated. Spectral Doppler was utilized to evaluate flow at rest and with distal augmentation maneuvers in the common femoral, femoral and popliteal veins. COMPARISON:  None. FINDINGS: RIGHT LOWER EXTREMITY Common Femoral Vein: No evidence of thrombus. Normal compressibility, respiratory phasicity and response to augmentation. Saphenofemoral Junction: No evidence of thrombus. Normal compressibility and flow on color Doppler imaging. Profunda Femoral Vein: No evidence of thrombus. Normal compressibility and flow on color Doppler imaging. Femoral Vein: No evidence of thrombus. Normal compressibility, respiratory phasicity and response to augmentation. Popliteal Vein: No evidence of thrombus. Normal compressibility, respiratory phasicity and response to augmentation. Calf Veins: No evidence of thrombus. Normal compressibility and flow on color Doppler imaging. Other Findings:  None. LEFT LOWER EXTREMITY Common Femoral Vein: No evidence of thrombus. Normal compressibility, respiratory phasicity and response to augmentation. Saphenofemoral Junction: No evidence of thrombus. Normal compressibility and flow on color Doppler imaging. Profunda Femoral Vein: No evidence of thrombus. Normal compressibility and flow on color Doppler imaging. Femoral Vein: No evidence of thrombus. Normal compressibility, respiratory phasicity and  response to augmentation. Popliteal Vein: No evidence of thrombus. Normal compressibility, respiratory phasicity and response to augmentation. Calf Veins: No evidence of thrombus. Normal compressibility and flow on color Doppler imaging. Other Findings:  None. IMPRESSION: No evidence of bilateral lower extremity deep venous thrombosis. Marliss Cootsylan Suttle, MD [facility] Electronically Signed   By: Marliss Cootsylan  Suttle MD   On: 03/12/2021 14:16   US PELVIC COMPLETE WITH TRANSVAGINAL  Result Date: 03/13/2021 CLINICAL DATA:  Acute anemia EXAM: TRANSABDOMINAL AND TRANSVAGINAL ULTRASOUND OF PELVIS TECHNIQUE: Both transabdominal and transvaginal ultrasound examinations of the pelvis were performed. Transabdominal technique was performed for global imaging of the pelvis including uterus, ovaries, adnexal regions, and pelvic cul-de-sac. It was necessary to proceed with endovaginal exam following the transabdominal exam to visualize the uterus, endometrium, and ovaries. COMPARISON:  Ultrasound 02/05/2008 FINDINGS: Uterus Measurements: 4.2 x 2.8 x 3.3 cm = volume: 20.4 mL. Retroverted uterus. No concerning uterine mass or discernible fibroid. Endometrium  Thickness: 3.3 mm, non thickened.  No focal abnormality visualized. Right ovary Measurements: 3.6 x 2.5 x 2.7 cm = volume: 12.5 mL. Multiple anechoic follicles. Slightly more complex 1.6 cm cyst with reticular echoes suggesting a hemorrhagic cyst. No concerning adnexal mass. Left ovary Measurements: 2.6 x 1.8 x 2.2 cm = volume: 5.4 mL. Multiple anechoic follicles. No concerning adnexal mass. Other findings There is trace free fluid adjacent the right ovary with some low level internal echoes which could reflect rupture of a hemorrhagic cyst. IMPRESSION: Multiple simple appearing follicles seen in both ovaries. A slightly more complex 1.6 cm lobular cyst with internal echogenicity suggestive of a hemorrhagic cyst is seen in the  right adnexa. Only trace free fluid adjacent the right ovary with some low level internal echoes (45/72) can reflect small volume hemorrhage such as from hemorrhagic cyst rupture. Electronically Signed   By: Kreg Shropshire M.D.   On: 03/13/2021 19:16        Scheduled Meds: . bisacodyl  10 mg Oral Once  . cyanocobalamin  1,000 mcg Intramuscular Daily  . polyethylene glycol-electrolytes  4,000 mL Oral Once   Continuous Infusions:   LOS: 1 day    Time spent: 28 minutes    Marrion Coy, MD [facility]   To contact the attending provider between 7A-7P or the covering provider during after hours 7P-7A, please log into the web site [email or web address] and access using universal [place] password for that web site. If you do not have the password, please call the hospital operator.  03/14/2021, 11:11 AM

## 2021-03-15 ENCOUNTER — Encounter: Payer: Self-pay | Admitting: Internal Medicine

## 2021-03-15 ENCOUNTER — Inpatient Hospital Stay: Payer: Self-pay | Admitting: Anesthesiology

## 2021-03-15 ENCOUNTER — Encounter
Admission: EM | Disposition: A | Discharge status: Home / Self Care | Payer: Self-pay | Source: Home / Self Care | Attending: Internal Medicine

## 2021-03-15 DIAGNOSIS — D509 Iron deficiency anemia, unspecified: Secondary | ICD-10-CM

## 2021-03-15 DIAGNOSIS — K209 Esophagitis, unspecified without bleeding: Secondary | ICD-10-CM

## 2021-03-15 DIAGNOSIS — K319 Disease of stomach and duodenum, unspecified: Secondary | ICD-10-CM

## 2021-03-15 DIAGNOSIS — D508 Other iron deficiency anemias: Secondary | ICD-10-CM

## 2021-03-15 DIAGNOSIS — K449 Diaphragmatic hernia without obstruction or gangrene: Secondary | ICD-10-CM

## 2021-03-15 HISTORY — PX: COLONOSCOPY: SHX5424

## 2021-03-15 HISTORY — PX: ESOPHAGOGASTRODUODENOSCOPY: SHX5428

## 2021-03-15 LAB — CBC WITH DIFFERENTIAL/PLATELET
Abs Immature Granulocytes: 0.03 10*3/uL (ref 0.00–0.07)
Basophils Absolute: 0.1 10*3/uL (ref 0.0–0.1)
Basophils Relative: 1 %
Eosinophils Absolute: 0.1 10*3/uL (ref 0.0–0.5)
Eosinophils Relative: 2 %
HCT: 34.6 % — ABNORMAL LOW (ref 36.0–46.0)
Hemoglobin: 10 g/dL — ABNORMAL LOW (ref 12.0–15.0)
Immature Granulocytes: 1 %
Lymphocytes Relative: 30 %
Lymphs Abs: 1.4 10*3/uL (ref 0.7–4.0)
MCH: 24.8 pg — ABNORMAL LOW (ref 26.0–34.0)
MCHC: 28.9 g/dL — ABNORMAL LOW (ref 30.0–36.0)
MCV: 85.9 fL (ref 80.0–100.0)
Monocytes Absolute: 0.7 10*3/uL (ref 0.1–1.0)
Monocytes Relative: 14 %
Neutro Abs: 2.4 10*3/uL (ref 1.7–7.7)
Neutrophils Relative %: 52 %
Platelets: 258 10*3/uL (ref 150–400)
RBC: 4.03 MIL/uL (ref 3.87–5.11)
RDW: 24.2 % — ABNORMAL HIGH (ref 11.5–15.5)
WBC: 4.6 10*3/uL (ref 4.0–10.5)
nRBC: 0 % (ref 0.0–0.2)

## 2021-03-15 LAB — BASIC METABOLIC PANEL
Anion gap: 5 (ref 5–15)
BUN: <5 mg/dL — ABNORMAL LOW (ref 6–20)
CO2: 23 mmol/L (ref 22–32)
Calcium: 8.7 mg/dL — ABNORMAL LOW (ref 8.9–10.3)
Chloride: 111 mmol/L (ref 98–111)
Creatinine, Ser: 0.44 mg/dL (ref 0.44–1.00)
GFR, Estimated: >60 mL/min (ref >60)
Glucose, Bld: 91 mg/dL (ref 70–99)
Potassium: 4.1 mmol/L (ref 3.5–5.1)
Sodium: 139 mmol/L (ref 135–145)

## 2021-03-15 LAB — MAGNESIUM: Magnesium: 1.7 mg/dL (ref 1.7–2.4)

## 2021-03-15 LAB — PHOSPHORUS: Phosphorus: 3.9 mg/dL (ref 2.5–4.6)

## 2021-03-15 SURGERY — COLONOSCOPY
Anesthesia: General

## 2021-03-15 MED ORDER — FENTANYL CITRATE (PF) 100 MCG/2ML IJ SOLN: 25.0000 ug | INTRAMUSCULAR | Status: DC | PRN

## 2021-03-15 MED ORDER — PROPOFOL 500 MG/50ML IV EMUL
INTRAVENOUS | Status: AC
  Filled 2021-03-15: qty 50

## 2021-03-15 MED ORDER — VITAMIN B-12 1000 MCG PO TABS: 1000.0000 ug | ORAL_TABLET | Freq: Every day | ORAL | 0 refills | Status: DC

## 2021-03-15 MED ORDER — LIDOCAINE HCL (CARDIAC) PF 100 MG/5ML IV SOSY
PREFILLED_SYRINGE | INTRAVENOUS | Status: DC | PRN
  Administered 2021-03-15: 40 mg via INTRAVENOUS

## 2021-03-15 MED ORDER — PANTOPRAZOLE SODIUM 40 MG PO TBEC: 40.0000 mg | DELAYED_RELEASE_TABLET | Freq: Every day | ORAL | 0 refills | Status: DC

## 2021-03-15 MED ORDER — PROPOFOL 10 MG/ML IV BOLUS
INTRAVENOUS | Status: DC | PRN
  Administered 2021-03-15 (×2): 50 mg via INTRAVENOUS

## 2021-03-15 MED ORDER — FERROUS SULFATE 325 (65 FE) MG PO TABS: 325.0000 mg | ORAL_TABLET | Freq: Every day | ORAL | 0 refills | Status: DC

## 2021-03-15 MED ORDER — ONDANSETRON HCL 4 MG/2ML IJ SOLN
4.0000 mg | Freq: Once | INTRAMUSCULAR | Status: DC | PRN
Start: 2021-03-15 — End: 2021-03-15

## 2021-03-15 MED ORDER — DEXMEDETOMIDINE (PRECEDEX) IN NS 20 MCG/5ML (4 MCG/ML) IV SYRINGE
PREFILLED_SYRINGE | INTRAVENOUS | Status: DC | PRN
  Administered 2021-03-15: 12 ug via INTRAVENOUS
  Administered 2021-03-15: 8 ug via INTRAVENOUS

## 2021-03-15 MED ORDER — SODIUM CHLORIDE 0.9 % IV SOLN
INTRAVENOUS | Status: DC
  Administered 2021-03-15: 20 mL/h via INTRAVENOUS

## 2021-03-15 MED ORDER — PROPOFOL 500 MG/50ML IV EMUL
INTRAVENOUS | Status: DC | PRN
  Administered 2021-03-15: 175 ug/kg/min via INTRAVENOUS

## 2021-03-15 NOTE — Plan of Care (Signed)

## 2021-03-15 NOTE — Op Note (Signed)
[facility] Patient Name: [facility] Procedure Date: 03/15/2021 11:48 AM MRN: [id number] Account #: [id number] Date of Birth: Aug 30, 1986 Admit Type: Outpatient Age: 35 Room: [facility] ENDO ROOM 2 Gender: Female Note Status: Finalized Procedure:             Upper GI endoscopy Indications:           Iron deficiency anemia Providers:             Nachum Derossett B. Maximino Greenland MD, MD Referring MD:          Sallye Lat Md, MD (Referring MD) Medicines:             Monitored Anesthesia Care Complications:         No immediate complications. Procedure:             Pre-Anesthesia Assessment:                        - Prior to the procedure, a History and Physical was                         performed, and patient medications, allergies and                         sensitivities were reviewed. The patient's tolerance                         of previous anesthesia was reviewed.                        - The risks and benefits of the procedure and the                         sedation options and risks were discussed with the                         patient. All questions were answered and informed                         consent was obtained.                        - Patient identification and proposed procedure were                         verified prior to the procedure by the physician, the                         nurse, the anesthesiologist, the anesthetist and the                         technician. The procedure was verified in the                         procedure room.                        - ASA Grade Assessment: II - A patient with mild                         systemic  disease.                        After obtaining informed consent, the endoscope was                         passed under direct vision. Throughout the procedure,                         the patient's blood pressure, pulse, and oxygen                         saturations were monitored continuously.  The Endoscope                         was introduced through the mouth, and advanced to the                         second part of duodenum. The upper GI endoscopy was                         accomplished with ease. The patient tolerated the                         procedure well. Findings:      LA Grade B (one or more mucosal breaks greater than 5 mm, not extending       between the tops of two mucosal folds) esophagitis with no bleeding was       found in the distal esophagus.      The exam of the esophagus was otherwise normal.      Patchy mildly erythematous mucosa without bleeding was found in the       gastric antrum. Biopsies were taken with a cold forceps for histology.       Biopsies were obtained in the gastric body, at the incisura and in the       gastric antrum with cold forceps for histology.      A small hiatal hernia was present.      The exam of the stomach was otherwise normal.      The duodenal bulb, second portion of the duodenum and examined duodenum       were normal. Biopsies for histology were taken with a cold forceps for       evaluation of celiac disease. Impression:            - LA Grade B reflux esophagitis with no bleeding.                        - Erythematous mucosa in the antrum. Biopsied.                        - Small hiatal hernia.                        - Normal duodenal bulb, second portion of the duodenum                         and examined duodenum. Biopsied.                        -  Biopsies were obtained in the gastric body, at the                         incisura and in the gastric antrum. Recommendation:        - Await pathology results.                        - Advance diet as tolerated.                        - Continue present medications.                        - Patient has a contact number available for                         emergencies. The signs and symptoms of potential                         delayed complications were discussed  with the patient.                         Return to normal activities tomorrow. Written                         discharge instructions were provided to the patient.                        - Discharge patient to home (with escort).                        - The findings and recommendations were discussed with                         the patient.                        - The findings and recommendations were discussed with                         the patient's family.                        - Follow an antireflux regimen.                        - Use Protonix (pantoprazole) 40 mg PO daily for 8                         weeks.                        - Return to GI clinic in 4 weeks. Procedure Code(s):     --- Professional ---                        [phone], Esophagogastroduodenoscopy, flexible,                         transoral; with biopsy, single or multiple Diagnosis Code(s):     --- Professional ---  K21.00, Gastro-esophageal reflux disease with                         esophagitis, without bleeding                        K31.89, Other diseases of stomach and duodenum                        K44.9, Diaphragmatic hernia without obstruction or                         gangrene                        D50.9, Iron deficiency anemia, unspecified CPT copyright 2019 American Medical Association. All rights reserved. The codes documented in this report are preliminary and upon coder review may  be revised to meet current compliance requirements.  Melodie Bouillon, MD Michel Bickers B. Maximino Greenland MD, MD 03/15/2021 12:10:18 PM This report has been signed electronically. Number of Addenda: 0 Note Initiated On: 03/15/2021 11:48 AM Estimated Blood Loss:  Estimated blood loss: none.      [facility]

## 2021-03-15 NOTE — Discharge Summary (Signed)
Physician Discharge Summary  Patient ID: Sandra Pearson MRN: [id number] DOB/AGE: [birth year] 35 y.o.  Admit date: 03/12/2021 Discharge date: 03/15/2021  Admission Diagnoses:  Discharge Diagnoses:  Active Problems:   Severe anemia   Hypokalemia   Hypomagnesemia   Acute anemia   Iron deficiency anemia   Gastric erythema   Hiatal hernia   Acute esophagitis   Discharged Condition: good  Hospital Course: [place] 35 y.o.femalewith medical history significant for hypertension, iron deficiency anemia, heavy menses many years ago (teenage years), tobacco use disorder. She presented to the hospital because of shortness of breath, generalized weakness, easy fatigability and dizziness.He has not had a menstrual period for 2 years, she does not have any rectal bleeding or black stools. Her hemoglobin when she came to the hospital was 6.5, she received blood transfusion.  #1.  Severe iron deficient anemia. Vitamin B12 deficient anemia Anemia most likely due to malabsorption, no evidence of GI bleed.  Received IV iron, also receiving B12 shots. EGD showed gastritis, colonoscopy did not show any abnormality. Due to weight loss and early menopausal, transvaginal ultrasound was obtained, showed some hemorrhagic cyst. Patient is still recommended to be seen by her OB/GYN as outpatient. We will continue Protonix of 40 mg twice a day for 8 weeks. Also prescribed B12 and oral iron. Patient will be followed by GI and hematology as outpatient in 4 weeks. Also asked social work to arrange follow-up PCP follow-up in 1 week.  2.  Hypokalemia. Hypomagnesemia Condition improved.    Consults: GI  Significant Diagnostic Studies:  TRANSABDOMINAL AND TRANSVAGINAL ULTRASOUND OF PELVIS  TECHNIQUE: Both transabdominal and transvaginal ultrasound examinations of the pelvis were performed. Transabdominal technique was performed for global imaging of the pelvis including uterus,  ovaries, adnexal regions, and pelvic cul-de-sac. It was necessary to proceed with endovaginal exam following the transabdominal exam to visualize the uterus, endometrium, and ovaries.  COMPARISON:  Ultrasound 02/05/2008  FINDINGS: Uterus  Measurements: 4.2 x 2.8 x 3.3 cm = volume: 20.4 mL. Retroverted uterus. No concerning uterine mass or discernible fibroid.  Endometrium  Thickness: 3.3 mm, non thickened.  No focal abnormality visualized.  Right ovary  Measurements: 3.6 x 2.5 x 2.7 cm = volume: 12.5 mL. Multiple anechoic follicles. Slightly more complex 1.6 cm cyst with reticular echoes suggesting a hemorrhagic cyst. No concerning adnexal mass.  Left ovary  Measurements: 2.6 x 1.8 x 2.2 cm = volume: 5.4 mL. Multiple anechoic follicles. No concerning adnexal mass.  Other findings  There is trace free fluid adjacent the right ovary with some low level internal echoes which could reflect rupture of a hemorrhagic cyst.  IMPRESSION: Multiple simple appearing follicles seen in both ovaries. A slightly more complex 1.6 cm lobular cyst with internal echogenicity suggestive of a hemorrhagic cyst is seen in the right adnexa. Only trace free fluid adjacent the right ovary with some low level internal echoes (45/72) can reflect small volume hemorrhage such as from hemorrhagic cyst rupture.   Electronically Signed   By: Kreg Shropshire M.D.   On: 03/13/2021 19:16  Colonoscopy. The entire examined colon is normal. - No specimens collected.  EGD. LA Grade B reflux esophagitis with no bleeding. - Erythematous mucosa in the antrum. Biopsied. - Small hiatal hernia. Normal duodenal bulb, second portion of the duodenum and examined duodenum. Biopsied. - Biopsies were obtained in the gastric body, at the incisura and in the gastric antrum. - Await pathology results. - Advance diet as tolerated. - Continue present medications. -  Patient has a contact number  available for emergencies. The signs and symptoms of potential delayed complications were discussed with the patient. Return to normal activities tomorrow. Written discharge instructions were provided to the patient. - Discharge patient to home (with escort). - The findings and recommendations were discussed with the patient. - The findings and recommendations were discussed with the patient's family. - Follow an antireflux regimen. - Use Protonix (pantoprazole) 40 mg PO daily for 8 weeks. - Return to GI clinic in 4 weeks.   Treatments: PRBC, IV iron, b12  Discharge Exam: Blood pressure 120/84, pulse 85, temperature (!) 97 F (36.1 C), temperature source Temporal, resp. rate 13, SpO2 100 %, unknown if currently breastfeeding. General appearance: alert and cooperative Resp: clear to auscultation bilaterally Cardio: regular rate and rhythm, S1, S2 normal, no murmur, click, rub or gallop GI: soft, non-tender; bowel sounds normal; no masses,  no organomegaly Extremities: extremities normal, atraumatic, no cyanosis or edema  Disposition: Discharge disposition: 01-Home or Self Care       Discharge Instructions    Diet - low sodium heart healthy   Complete by: As directed    Increase activity slowly   Complete by: As directed      Allergies as of 03/15/2021   No Known Allergies     Medication List    STOP taking these medications   clindamycin 300 MG capsule Commonly known as: CLEOCIN     TAKE these medications   ferrous sulfate 325 (65 FE) MG tablet Take 1 tablet (325 mg total) by mouth daily.   folic acid 1 MG tablet Commonly known as: FOLVITE Take 1 tablet (1 mg total) by mouth daily.   gabapentin 100 MG capsule Commonly known as: Neurontin Take 1 capsule (100 mg total) by mouth 3 (three) times daily as needed.   ondansetron 4 MG tablet Commonly known as: ZOFRAN Take 1 tablet (4 mg total) by mouth every 8 (eight) hours as needed for nausea or vomiting.    oxyCODONE-acetaminophen 5-325 MG tablet Commonly known as: PERCOCET/ROXICET Take 1 tablet by mouth every 4 (four) hours as needed for severe pain.   promethazine 25 MG tablet Commonly known as: PHENERGAN Take 1 tablet (25 mg total) by mouth every 6 (six) hours as needed for nausea or vomiting.   traMADol 50 MG tablet Commonly known as: Ultram Take 1 tablet (50 mg total) by mouth every 6 (six) hours as needed.   vitamin B-12 1000 MCG tablet Commonly known as: CYANOCOBALAMIN Take 1 tablet (1,000 mcg total) by mouth daily.       Follow-up Information    Pasty Spillers, MD Follow up in 4 week(s).   Specialty: Gastroenterology Contact information: [address] [place] [address] [phone]        Earna Coder, MD Follow up in 4 week(s).   Specialties: Internal Medicine, Oncology Contact information: [address] [place] [address] [phone]               Signed: Marrion Coy 03/15/2021, 1:50 PM

## 2021-03-15 NOTE — Anesthesia Postprocedure Evaluation (Signed)
Anesthesia Post Note  Patient: Sandra Pearson  Procedure(s) Performed: COLONOSCOPY (N/A ) ESOPHAGOGASTRODUODENOSCOPY (EGD) (N/A )  Patient location during evaluation: Endoscopy Anesthesia Type: General Level of consciousness: awake and alert and oriented Pain management: pain level controlled Vital Signs Assessment: post-procedure vital signs reviewed and stable Respiratory status: spontaneous breathing Cardiovascular status: blood pressure returned to baseline Anesthetic complications: no   No complications documented.   Last Vitals:  Vitals:   03/15/21 1233 03/15/21 1253  BP: 109/66 120/84  Pulse: 85   Resp: 13   Temp: (!) 36.1 C   SpO2: 100%     Last Pain:  Vitals:   03/15/21 1253  TempSrc:   PainSc: 0-No pain                 Federick Levene

## 2021-03-15 NOTE — Progress Notes (Signed)
Sandra Bouillon, MD [address], [address], [place], [place], [address] [address], [place], [place], [address] Phone: [phone]  Fax: [phone]   Subjective: Patient denies any episodes of active bleeding.  Denies any abdominal pain.   Objective: Exam: Vital signs in last 24 hours: Vitals:   03/15/21 0026 03/15/21 0325 03/15/21 0820 03/15/21 1130  BP: 126/88 116/88 114/67 126/90  Pulse: 91 81 81 (!) 110  Resp: 16 14 18 20   Temp: 98.5 F (36.9 C) 98 F (36.7 C) 98 F (36.7 C) (!) 97.1 F (36.2 C)  TempSrc:    Temporal  SpO2: 100% 100% 100% 100%   Weight change:   Intake/Output Summary (Last 24 hours) at 03/15/2021 1139 Last data filed at 03/14/2021 1833 Gross per 24 hour  Intake 780 ml  Output --  Net 780 ml    General: No acute distress, AAO x3 Abd: Soft, NT/ND, No HSM Skin: Warm, no rashes Neck: Supple, Trachea midline   Lab Results: Lab Results  Component Value Date   WBC 4.6 03/15/2021   HGB 10.0 (L) 03/15/2021   HCT 34.6 (L) 03/15/2021   MCV 85.9 03/15/2021   PLT 258 03/15/2021   Micro Results: Recent Results (from the past 240 hour(s))  Resp Panel by RT-PCR (Flu A&B, Covid) Nasopharyngeal Swab     Status: None   Collection Time: 03/12/21  4:44 PM   Specimen: Nasopharyngeal Swab; Nasopharyngeal(NP) swabs in vial transport medium  Result Value Ref Range Status   SARS Coronavirus 2 by RT PCR NEGATIVE NEGATIVE Final    Comment: (NOTE) SARS-CoV-2 target nucleic acids are NOT DETECTED.  The SARS-CoV-2 RNA is generally detectable in upper respiratory specimens during the acute phase of infection. The lowest concentration of SARS-CoV-2 viral copies this assay can detect is 138 copies/mL. A negative result does not preclude SARS-Cov-2 infection and should not be used as the sole basis for treatment or other patient management decisions. A negative result may occur with  improper specimen collection/handling, submission of  specimen other than nasopharyngeal swab, presence of viral mutation(s) within the areas targeted by this assay, and inadequate number of viral copies(<138 copies/mL). A negative result must be combined with clinical observations, patient history, and epidemiological information. The expected result is Negative.  Fact Sheet for Patients:  03/14/21  Fact Sheet for Healthcare Providers:  BloggerCourse.com  This test is no t yet approved or cleared by the [employer] FDA and  has been authorized for detection and/or diagnosis of SARS-CoV-2 by FDA under an Emergency Use Authorization (EUA). This EUA will remain  in effect (meaning this test can be used) for the duration of the COVID-19 declaration under Section 564(b)(1) of the Act, 21 U.S.C.section 360bbb-3(b)(1), unless the authorization is terminated  or revoked sooner.       Influenza A by PCR NEGATIVE NEGATIVE Final   Influenza B by PCR NEGATIVE NEGATIVE Final    Comment: (NOTE) The Xpert Xpress SARS-CoV-2/FLU/RSV plus assay is intended as an aid in the diagnosis of influenza from Nasopharyngeal swab specimens and should not be used as a sole basis for treatment. Nasal washings and aspirates are unacceptable for Xpert Xpress SARS-CoV-2/FLU/RSV testing.  Fact Sheet for Patients: [place]  Fact Sheet for Healthcare Providers: BloggerCourse.com  This test is not yet approved or cleared by the [employer]ious[employer]ker.it FDA and has been authorized for detection and/or diagnosis of SARS-CoV-2 by FDA under an Emergency Use Authorization (EUA). This EUA will remain in effect (meaning this  test can be used) for the duration of the COVID-19 declaration under Section 564(b)(1) of the Act, 21 U.S.C. section 360bbb-3(b)(1), unless the authorization is terminated or revoked.  Performed at [facility], [address] [place], [place] [address]    Studies/Results: US PELVIC COMPLETE WITH TRANSVAGINAL  Result Date: 03/13/2021 CLINICAL DATA:  Acute anemia EXAM: TRANSABDOMINAL AND TRANSVAGINAL ULTRASOUND OF PELVIS TECHNIQUE: Both transabdominal and transvaginal ultrasound examinations of the pelvis were performed. Transabdominal technique was performed for global imaging of the pelvis including uterus, ovaries, adnexal regions, and pelvic cul-de-sac. It was necessary to proceed with endovaginal exam following the transabdominal exam to visualize the uterus, endometrium, and ovaries. COMPARISON:  Ultrasound 02/05/2008 FINDINGS: Uterus Measurements: 4.2 x 2.8 x 3.3 cm = volume: 20.4 mL. Retroverted uterus. No concerning uterine mass or discernible fibroid. Endometrium Thickness: 3.3 mm, non thickened.  No focal abnormality visualized. Right ovary Measurements: 3.6 x 2.5 x 2.7 cm = volume: 12.5 mL. Multiple anechoic follicles. Slightly more complex 1.6 cm cyst with reticular echoes suggesting a hemorrhagic cyst. No concerning adnexal mass. Left ovary Measurements: 2.6 x 1.8 x 2.2 cm = volume: 5.4 mL. Multiple anechoic follicles. No concerning adnexal mass. Other findings There is trace free fluid adjacent the right ovary with some low level internal echoes which could reflect rupture of a hemorrhagic cyst. IMPRESSION: Multiple simple appearing follicles seen in both ovaries. A slightly more complex 1.6 cm lobular cyst with internal echogenicity suggestive of a hemorrhagic cyst is seen in the right adnexa. Only trace free fluid adjacent the right ovary with some low level internal echoes (45/72) can reflect small volume hemorrhage such as from hemorrhagic cyst rupture. Electronically Signed   By: Kreg Shropshire M.D.   On: 03/13/2021 19:16   Medications:  Scheduled Meds: Continuous Infusions: . sodium chloride 20 mL/hr (03/15/21 1129)   PRN Meds:.[MAR Hold] acetaminophen **OR** [MAR Hold] acetaminophen, fentaNYL (SUBLIMAZE)  injection, [MAR Hold] ondansetron **OR** [MAR Hold] ondansetron (ZOFRAN) IV, ondansetron (ZOFRAN) IV, [MAR Hold] polyethylene glycol   Assessment: Active Problems:   Severe anemia   Hypokalemia   Hypomagnesemia   Acute anemia    Plan:  Proceed with EGD and colonoscopy today for evaluation of iron deficiency anemia  Keep n.p.o. until procedure and then see post procedure notes for findings details and recommendations  I have discussed alternative options, risks & benefits,  which include, but are not limited to, bleeding, infection, perforation,respiratory complication & drug reaction.  The patient agrees with this plan & written consent will be obtained.    If EGD and colonoscopy is negative, patient will likely need small bowel capsule study as an outpatient for further evaluation of iron deficiency anemia   LOS: 2 days   Sandra Bouillon, MD 03/15/2021, 11:39 AM

## 2021-03-15 NOTE — Anesthesia Preprocedure Evaluation (Signed)
Anesthesia Evaluation  Patient identified by MRN, date of birth, ID band Patient awake    Reviewed: Allergy & Precautions, NPO status , Patient's Chart, lab work & pertinent test results  Airway Mallampati: II  TM Distance: >3 FB     Dental   Pulmonary Current Smoker,    Pulmonary exam normal        Cardiovascular hypertension, Normal cardiovascular exam     Neuro/Psych negative neurological ROS  negative psych ROS   GI/Hepatic Neg liver ROS,   Endo/Other  negative endocrine ROS  Renal/GU negative Renal ROS  negative genitourinary   Musculoskeletal negative musculoskeletal ROS (+)   Abdominal Normal abdominal exam  (+)   Peds negative pediatric ROS (+)  Hematology  (+) anemia ,   Anesthesia Other Findings   Reproductive/Obstetrics                             Anesthesia Physical Anesthesia Plan  ASA: II  Anesthesia Plan: General   Post-op Pain Management:    Induction: Intravenous  PONV Risk Score and Plan: Propofol infusion  Airway Management Planned: Nasal Cannula  Additional Equipment:   Intra-op Plan:   Post-operative Plan:   Informed Consent: I have reviewed the patients History and Physical, chart, labs and discussed the procedure including the risks, benefits and alternatives for the proposed anesthesia with the patient or authorized representative who has indicated his/her understanding and acceptance.     Dental advisory given  Plan Discussed with: CRNA and Surgeon  Anesthesia Plan Comments:         Anesthesia Quick Evaluation

## 2021-03-15 NOTE — Op Note (Signed)
[facility]astroenterology Patient Name: [facility] Procedure Date: 03/15/2021 11:47 AM MRN: [id number] Account #: [id number] Date of Birth: 1986/01/02 Admit Type: Outpatient Age: 35 Room: [facility] ENDO ROOM 2 Gender: Female Note Status: Finalized Procedure:             Colonoscopy Providers:             Leiland Mihelich B. Maximino Greenland MD, MD Referring MD:          Sallye Lat Md, MD (Referring MD) Medicines:             Monitored Anesthesia Care Complications:         No immediate complications. Procedure:             Pre-Anesthesia Assessment:                        - Prior to the procedure, a History and Physical was                         performed, and patient medications, allergies and                         sensitivities were reviewed. The patient's tolerance                         of previous anesthesia was reviewed.                        - The risks and benefits of the procedure and the                         sedation options and risks were discussed with the                         patient. All questions were answered and informed                         consent was obtained.                        - Patient identification and proposed procedure were                         verified prior to the procedure by the physician, the                         nurse, the anesthesiologist, the anesthetist and the                         technician. The procedure was verified in the                         pre-procedure area in the procedure room in the                         endoscopy suite.                        - Prophylactic Antibiotics: The patient does not  require prophylactic antibiotics.                        - ASA Grade Assessment: II - A patient with mild                         systemic disease.                        - After reviewing the risks and benefits, the patient                         was deemed in satisfactory condition to  undergo the                         procedure.                        - Monitored anesthesia care was determined to be                         medically necessary for this procedure based on review                         of the patient's medical history, medications, and                         prior anesthesia history.                        - The anesthesia plan was to use monitored anesthesia                         care (MAC).                        After obtaining informed consent, the colonoscope was                         passed under direct vision. Throughout the procedure,                         the patient's blood pressure, pulse, and oxygen                         saturations were monitored continuously. The                         Colonoscope was introduced through the anus and                         advanced to the the terminal ileum. The colonoscopy                         was performed with ease. The patient tolerated the                         procedure well. The quality of the bowel preparation  was good. Findings:      The perianal and digital rectal examinations were normal.      The colon (entire examined portion) appeared normal.      The retroflexed view of the distal rectum and anal verge was normal and       showed no anal or rectal abnormalities. Impression:            - The entire examined colon is normal.                        - No specimens collected. Recommendation:        - Return to GI clinic in 4 weeks.                        - To visualize the small bowel, perform video capsule                         endoscopy at appointment to be scheduled.                        - Continue present medications.                        - Return to primary care physician in 4 weeks.                        - The findings and recommendations were discussed with                         the patient.                        - The findings and  recommendations were discussed with                         the patient's family. Procedure Code(s):     --- Professional ---                        [phone], Colonoscopy, flexible; diagnostic, including                         collection of specimen(s) by brushing or washing, when                         performed (separate procedure) CPT copyright 2019 [employer]. All rights reserved. The codes documented in this report are preliminary and upon coder review may  be revised to meet current compliance requirements.  Melodie Bouillon, MD Michel Bickers B. Maximino Greenland MD, MD 03/15/2021 12:35:51 PM This report has been signed electronically. Number of Addenda: 0 Note Initiated On: 03/15/2021 11:47 AM Scope Withdrawal Time: 0 hours 9 minutes 36 seconds  Total Procedure Duration: 0 hours 14 minutes 30 seconds  Estimated Blood Loss:  Estimated blood loss: none.      [facility]

## 2021-03-15 NOTE — Discharge Instructions (Signed)
[employer] medicine (25th ed., pp. [phone]). [place], [place]: Elsevier.">  Anemia  Anemia is a condition in which there is not enough red blood cells or hemoglobin in the blood. Hemoglobin is a substance in red blood cells that carries oxygen. When you do not have enough red blood cells or hemoglobin (are anemic), your body cannot get enough oxygen and your organs may not work properly. As a result, you may feel very tired or have other problems. What are the causes? Common causes of anemia include:  Excessive bleeding. Anemia can be caused by excessive bleeding inside or outside the body, including bleeding from the intestines or from heavy menstrual periods in females.  Poor nutrition.  Long-lasting (chronic) kidney, thyroid, and liver disease.  Bone marrow disorders, spleen problems, and blood disorders.  Cancer and treatments for cancer.  HIV (human immunodeficiency virus) and AIDS (acquired immunodeficiency syndrome).  Infections, medicines, and autoimmune disorders that destroy red blood cells. What are the signs or symptoms? Symptoms of this condition include:  Minor weakness.  Dizziness.  Headache, or difficulties concentrating and sleeping.  Heartbeats that feel irregular or faster than normal (palpitations).  Shortness of breath, especially with exercise.  Pale skin, lips, and nails, or cold hands and feet.  Indigestion and nausea. Symptoms may occur suddenly or develop slowly. If your anemia is mild, you may not have symptoms. How is this diagnosed? This condition is diagnosed based on blood tests, your medical history, and a physical exam. In some cases, a test may be needed in which cells are removed from the soft tissue inside of a bone and looked at under a microscope (bone marrow biopsy). Your health care provider may also check your stool (feces) for blood and may do additional testing to look for the cause of your bleeding. Other tests may  include:  Imaging tests, such as a CT scan or MRI.  A procedure to see inside your esophagus and stomach (endoscopy).  A procedure to see inside your colon and rectum (colonoscopy). How is this treated? Treatment for this condition depends on the cause. If you continue to lose a lot of blood, you may need to be treated at a hospital. Treatment may include:  Taking supplements of iron, vitamin Q68, or folic acid.  Taking a hormone medicine (erythropoietin) that can help to stimulate red blood cell growth.  Having a blood transfusion. This may be needed if you lose a lot of blood.  Making changes to your diet.  Having surgery to remove your spleen. Follow these instructions at home:  Take over-the-counter and prescription medicines only as told by your health care provider.  Take supplements only as told by your health care provider.  Follow any diet instructions that you were given by your health care provider.  Keep all follow-up visits as told by your health care provider. This is important. Contact a health care provider if:  You develop new bleeding anywhere in the body. Get help right away if:  You are very weak.  You are short of breath.  You have pain in your abdomen or chest.  You are dizzy or feel faint.  You have trouble concentrating.  You have bloody stools, black stools, or tarry stools.  You vomit repeatedly or you vomit up blood. These symptoms may represent a serious problem that is an emergency. Do not wait to see if the symptoms will go away. Get medical help right away. Call your local emergency services (911 in the [place] Do not  drive yourself to the hospital. Summary  Anemia is a condition in which you do not have enough red blood cells or enough of a substance in your red blood cells that carries oxygen (hemoglobin).  Symptoms may occur suddenly or develop slowly.  If your anemia is mild, you may not have symptoms.  This condition is  diagnosed with blood tests, a medical history, and a physical exam. Other tests may be needed.  Treatment for this condition depends on the cause of the anemia. This information is not intended to replace advice given to you by your health care provider. Make sure you discuss any questions you have with your health care provider. Document Revised: 09/20/2019 Document Reviewed: 09/20/2019 Elsevier Patient Education  2021 [employer]  

## 2021-03-15 NOTE — Transfer of Care (Signed)
Immediate Anesthesia Transfer of Care Note  Patient: Sandra Pearson  Procedure(s) Performed: COLONOSCOPY (N/A ) ESOPHAGOGASTRODUODENOSCOPY (EGD) (N/A )  Patient Location: PACU  Anesthesia Type:General  Level of Consciousness: awake, alert  and oriented  Airway & Oxygen Therapy: Patient Spontanous Breathing  Post-op Assessment: Report given to RN and Post -op Vital signs reviewed and stable  Post vital signs: Reviewed and stable  Last Vitals:  Vitals Value Taken Time  BP 109/66 03/15/21 1233  Temp    Pulse 85 03/15/21 1233  Resp 13 03/15/21 1233  SpO2 100 % 03/15/21 1233  Vitals shown include unvalidated device data.  Last Pain:  Vitals:   03/15/21 1130  TempSrc: Temporal  PainSc:          Complications: No complications documented.

## 2021-03-18 ENCOUNTER — Encounter: Payer: Self-pay | Admitting: Gastroenterology

## 2021-03-18 ENCOUNTER — Other Ambulatory Visit: Payer: Self-pay | Admitting: Gastroenterology

## 2021-03-18 LAB — SURGICAL PATHOLOGY

## 2021-03-18 MED ORDER — CLARITHROMYCIN 500 MG PO TABS: 500.0000 mg | ORAL_TABLET | Freq: Two times a day (BID) | ORAL | 0 refills | Status: DC

## 2021-03-18 MED ORDER — AMOXICILLIN 500 MG PO TABS: 1000.0000 mg | ORAL_TABLET | Freq: Two times a day (BID) | ORAL | 0 refills | Status: DC

## 2021-03-18 MED ORDER — PANTOPRAZOLE SODIUM 20 MG PO TBEC
20.0000 mg | DELAYED_RELEASE_TABLET | Freq: Two times a day (BID) | ORAL | 0 refills | Status: DC
Start: 2021-03-18 — End: 2021-04-01

## 2021-03-28 DIAGNOSIS — F17210 Nicotine dependence, cigarettes, uncomplicated: Secondary | ICD-10-CM | POA: Insufficient documentation

## 2021-03-28 DIAGNOSIS — K859 Acute pancreatitis without necrosis or infection, unspecified: Principal | ICD-10-CM | POA: Insufficient documentation

## 2021-03-28 DIAGNOSIS — Z79899 Other long term (current) drug therapy: Secondary | ICD-10-CM | POA: Insufficient documentation

## 2021-03-28 DIAGNOSIS — Z20822 Contact with and (suspected) exposure to covid-19: Secondary | ICD-10-CM | POA: Insufficient documentation

## 2021-03-28 DIAGNOSIS — I10 Essential (primary) hypertension: Secondary | ICD-10-CM | POA: Insufficient documentation

## 2021-03-29 ENCOUNTER — Observation Stay
Admission: EM | Admit: 2021-03-29 | Discharge: 2021-03-29 | Disposition: A | Discharge status: Home / Self Care | Payer: Self-pay | Attending: Internal Medicine | Admitting: Internal Medicine

## 2021-03-29 ENCOUNTER — Inpatient Hospital Stay (HOSPITAL_COMMUNITY)
Admission: AD | Admit: 2021-03-29 | Discharge: 2021-04-01 | DRG: 439 | Disposition: A | Discharge status: Home / Self Care | Payer: Self-pay | Source: Other Acute Inpatient Hospital | Attending: Internal Medicine | Admitting: Internal Medicine

## 2021-03-29 ENCOUNTER — Emergency Department: Payer: Self-pay

## 2021-03-29 ENCOUNTER — Observation Stay: Payer: Self-pay

## 2021-03-29 ENCOUNTER — Encounter: Payer: Self-pay | Admitting: Emergency Medicine

## 2021-03-29 ENCOUNTER — Other Ambulatory Visit: Payer: Self-pay

## 2021-03-29 DIAGNOSIS — T363X5A Adverse effect of macrolides, initial encounter: Secondary | ICD-10-CM | POA: Diagnosis present

## 2021-03-29 DIAGNOSIS — K449 Diaphragmatic hernia without obstruction or gangrene: Secondary | ICD-10-CM | POA: Diagnosis present

## 2021-03-29 DIAGNOSIS — K2100 Gastro-esophageal reflux disease with esophagitis, without bleeding: Secondary | ICD-10-CM | POA: Diagnosis present

## 2021-03-29 DIAGNOSIS — R112 Nausea with vomiting, unspecified: Secondary | ICD-10-CM

## 2021-03-29 DIAGNOSIS — H0520 Unspecified exophthalmos: Secondary | ICD-10-CM | POA: Diagnosis present

## 2021-03-29 DIAGNOSIS — N920 Excessive and frequent menstruation with regular cycle: Secondary | ICD-10-CM | POA: Diagnosis present

## 2021-03-29 DIAGNOSIS — K859 Acute pancreatitis without necrosis or infection, unspecified: Secondary | ICD-10-CM | POA: Diagnosis present

## 2021-03-29 DIAGNOSIS — D696 Thrombocytopenia, unspecified: Secondary | ICD-10-CM | POA: Diagnosis present

## 2021-03-29 DIAGNOSIS — K8689 Other specified diseases of pancreas: Secondary | ICD-10-CM | POA: Diagnosis present

## 2021-03-29 DIAGNOSIS — R079 Chest pain, unspecified: Secondary | ICD-10-CM | POA: Diagnosis present

## 2021-03-29 DIAGNOSIS — D649 Anemia, unspecified: Secondary | ICD-10-CM | POA: Diagnosis present

## 2021-03-29 DIAGNOSIS — R0789 Other chest pain: Secondary | ICD-10-CM

## 2021-03-29 DIAGNOSIS — K760 Fatty (change of) liver, not elsewhere classified: Secondary | ICD-10-CM | POA: Diagnosis present

## 2021-03-29 DIAGNOSIS — Z20822 Contact with and (suspected) exposure to covid-19: Secondary | ICD-10-CM | POA: Diagnosis present

## 2021-03-29 DIAGNOSIS — D519 Vitamin B12 deficiency anemia, unspecified: Secondary | ICD-10-CM | POA: Diagnosis present

## 2021-03-29 DIAGNOSIS — G621 Alcoholic polyneuropathy: Secondary | ICD-10-CM | POA: Diagnosis present

## 2021-03-29 DIAGNOSIS — B9681 Helicobacter pylori [H. pylori] as the cause of diseases classified elsewhere: Secondary | ICD-10-CM | POA: Diagnosis present

## 2021-03-29 DIAGNOSIS — Z8619 Personal history of other infectious and parasitic diseases: Secondary | ICD-10-CM

## 2021-03-29 DIAGNOSIS — Z8249 Family history of ischemic heart disease and other diseases of the circulatory system: Secondary | ICD-10-CM

## 2021-03-29 DIAGNOSIS — K863 Pseudocyst of pancreas: Secondary | ICD-10-CM | POA: Diagnosis present

## 2021-03-29 DIAGNOSIS — D72819 Decreased white blood cell count, unspecified: Secondary | ICD-10-CM | POA: Diagnosis present

## 2021-03-29 DIAGNOSIS — F1010 Alcohol abuse, uncomplicated: Secondary | ICD-10-CM | POA: Diagnosis present

## 2021-03-29 DIAGNOSIS — I10 Essential (primary) hypertension: Secondary | ICD-10-CM | POA: Insufficient documentation

## 2021-03-29 DIAGNOSIS — D500 Iron deficiency anemia secondary to blood loss (chronic): Secondary | ICD-10-CM | POA: Diagnosis present

## 2021-03-29 DIAGNOSIS — F17210 Nicotine dependence, cigarettes, uncomplicated: Secondary | ICD-10-CM | POA: Diagnosis present

## 2021-03-29 DIAGNOSIS — K909 Intestinal malabsorption, unspecified: Secondary | ICD-10-CM | POA: Diagnosis present

## 2021-03-29 DIAGNOSIS — K8531 Drug induced acute pancreatitis with uninfected necrosis: Principal | ICD-10-CM | POA: Diagnosis present

## 2021-03-29 DIAGNOSIS — A048 Other specified bacterial intestinal infections: Secondary | ICD-10-CM | POA: Diagnosis present

## 2021-03-29 LAB — BASIC METABOLIC PANEL
Anion gap: 12 (ref 5–15)
BUN: <5 mg/dL — ABNORMAL LOW (ref 6–20)
CO2: 20 mmol/L — ABNORMAL LOW (ref 22–32)
Calcium: 9.4 mg/dL (ref 8.9–10.3)
Chloride: 106 mmol/L (ref 98–111)
Creatinine, Ser: 0.52 mg/dL (ref 0.44–1.00)
GFR, Estimated: >60 mL/min (ref >60)
Glucose, Bld: 184 mg/dL — ABNORMAL HIGH (ref 70–99)
Potassium: 3.8 mmol/L (ref 3.5–5.1)
Sodium: 138 mmol/L (ref 135–145)

## 2021-03-29 LAB — HEPATIC FUNCTION PANEL
ALT: 43 U/L (ref 0–44)
AST: 67 U/L — ABNORMAL HIGH (ref 15–41)
Albumin: 3.3 g/dL — ABNORMAL LOW (ref 3.5–5.0)
Alkaline Phosphatase: 161 U/L — ABNORMAL HIGH (ref 38–126)
Bilirubin, Direct: 0.3 mg/dL — ABNORMAL HIGH (ref 0.0–0.2)
Indirect Bilirubin: 0.9 mg/dL (ref 0.3–0.9)
Total Bilirubin: 1.2 mg/dL (ref 0.3–1.2)
Total Protein: 7.3 g/dL (ref 6.5–8.1)

## 2021-03-29 LAB — TRIGLYCERIDES: Triglycerides: 133 mg/dL (ref <150)

## 2021-03-29 LAB — APTT: aPTT: 29 seconds (ref 24–36)

## 2021-03-29 LAB — RESP PANEL BY RT-PCR (FLU A&B, COVID) ARPGX2
Influenza A by PCR: NEGATIVE
Influenza A by PCR: NEGATIVE
Influenza B by PCR: NEGATIVE
Influenza B by PCR: NEGATIVE
SARS Coronavirus 2 by RT PCR: NEGATIVE
SARS Coronavirus 2 by RT PCR: NEGATIVE

## 2021-03-29 LAB — HCG, QUANTITATIVE, PREGNANCY: hCG, Beta Chain, Quant, S: 1 m[IU]/mL (ref <5)

## 2021-03-29 LAB — TYPE AND SCREEN
ABO/RH(D): A POS
Antibody Screen: NEGATIVE

## 2021-03-29 LAB — CBC
HCT: 44.6 % (ref 36.0–46.0)
Hemoglobin: 13.6 g/dL (ref 12.0–15.0)
MCH: 26.2 pg (ref 26.0–34.0)
MCHC: 30.5 g/dL (ref 30.0–36.0)
MCV: 85.9 fL (ref 80.0–100.0)
Platelets: 307 10*3/uL (ref 150–400)
RBC: 5.19 MIL/uL — ABNORMAL HIGH (ref 3.87–5.11)
RDW: 23.3 % — ABNORMAL HIGH (ref 11.5–15.5)
WBC: 8 10*3/uL (ref 4.0–10.5)
nRBC: 0 % (ref 0.0–0.2)

## 2021-03-29 LAB — PROTIME-INR
INR: 1.1 (ref 0.8–1.2)
Prothrombin Time: 14.5 seconds (ref 11.4–15.2)

## 2021-03-29 LAB — TROPONIN I (HIGH SENSITIVITY)
Troponin I (High Sensitivity): 4 ng/L (ref <18)
Troponin I (High Sensitivity): 4 ng/L (ref <18)

## 2021-03-29 LAB — LIPASE, BLOOD: Lipase: 390 U/L — ABNORMAL HIGH (ref 11–51)

## 2021-03-29 LAB — PHOSPHORUS: Phosphorus: 4.1 mg/dL (ref 2.5–4.6)

## 2021-03-29 LAB — MAGNESIUM: Magnesium: 1.5 mg/dL — ABNORMAL LOW (ref 1.7–2.4)

## 2021-03-29 MED ORDER — HYDROMORPHONE HCL 1 MG/ML IJ SOLN
1.0000 mg | INTRAMUSCULAR | Status: DC | PRN
  Administered 2021-03-29 (×3): 1 mg via INTRAVENOUS
  Filled 2021-03-29 (×3): qty 1

## 2021-03-29 MED ORDER — VITAMIN B-12 1000 MCG PO TABS
1000.0000 ug | ORAL_TABLET | Freq: Every day | ORAL | Status: DC
  Filled 2021-03-29 (×2): qty 1

## 2021-03-29 MED ORDER — MAGNESIUM SULFATE 2 GM/50ML IV SOLN
2.0000 g | Freq: Once | INTRAVENOUS | Status: AC
  Administered 2021-03-29: 2 g via INTRAVENOUS
  Filled 2021-03-29: qty 50

## 2021-03-29 MED ORDER — IOHEXOL 300 MG/ML  SOLN
75.0000 mL | Freq: Once | INTRAMUSCULAR | Status: AC | PRN
  Administered 2021-03-29: 75 mL via INTRAVENOUS

## 2021-03-29 MED ORDER — GABAPENTIN 100 MG PO CAPS: 100.0000 mg | ORAL_CAPSULE | Freq: Three times a day (TID) | ORAL | Status: DC | PRN

## 2021-03-29 MED ORDER — ADULT MULTIVITAMIN W/MINERALS CH
1.0000 | ORAL_TABLET | Freq: Every day | ORAL | Status: DC
  Administered 2021-03-30 – 2021-04-01 (×3): 1 via ORAL
  Filled 2021-03-29 (×3): qty 1

## 2021-03-29 MED ORDER — HYDROMORPHONE HCL 1 MG/ML IJ SOLN
1.0000 mg | INTRAMUSCULAR | Status: DC | PRN
  Administered 2021-03-29 – 2021-03-30 (×4): 1 mg via INTRAVENOUS
  Filled 2021-03-29 (×4): qty 1

## 2021-03-29 MED ORDER — AMOXICILLIN 500 MG PO CAPS
1000.0000 mg | ORAL_CAPSULE | Freq: Two times a day (BID) | ORAL | Status: DC
  Filled 2021-03-29 (×2): qty 2

## 2021-03-29 MED ORDER — THIAMINE HCL 100 MG PO TABS
100.0000 mg | ORAL_TABLET | Freq: Every day | ORAL | Status: DC
  Administered 2021-03-30 – 2021-04-01 (×3): 100 mg via ORAL
  Filled 2021-03-29 (×3): qty 1

## 2021-03-29 MED ORDER — PANTOPRAZOLE SODIUM 20 MG PO TBEC
20.0000 mg | DELAYED_RELEASE_TABLET | Freq: Two times a day (BID) | ORAL | Status: DC
  Filled 2021-03-29 (×3): qty 1

## 2021-03-29 MED ORDER — LACTATED RINGERS IV BOLUS
1000.0000 mL | Freq: Once | INTRAVENOUS | Status: AC
  Administered 2021-03-29: 1000 mL via INTRAVENOUS

## 2021-03-29 MED ORDER — OXYCODONE HCL 5 MG PO TABS: 5.0000 mg | ORAL_TABLET | Freq: Four times a day (QID) | ORAL | Status: DC | PRN

## 2021-03-29 MED ORDER — LACTATED RINGERS IV SOLN: INTRAVENOUS | Status: DC

## 2021-03-29 MED ORDER — GADOBUTROL 1 MMOL/ML IV SOLN
5.0000 mL | Freq: Once | INTRAVENOUS | Status: AC | PRN
  Administered 2021-03-29: 5 mL via INTRAVENOUS

## 2021-03-29 MED ORDER — ONDANSETRON HCL 4 MG/2ML IJ SOLN
4.0000 mg | Freq: Once | INTRAMUSCULAR | Status: AC
  Administered 2021-03-29: 4 mg via INTRAVENOUS
  Filled 2021-03-29: qty 2

## 2021-03-29 MED ORDER — MELATONIN 3 MG PO TABS
3.0000 mg | ORAL_TABLET | Freq: Every evening | ORAL | Status: DC | PRN
  Administered 2021-03-31: 3 mg via ORAL
  Filled 2021-03-29: qty 1

## 2021-03-29 MED ORDER — FERROUS SULFATE 325 (65 FE) MG PO TABS: 325.0000 mg | ORAL_TABLET | Freq: Every day | ORAL | Status: DC

## 2021-03-29 MED ORDER — FOLIC ACID 1 MG PO TABS
1.0000 mg | ORAL_TABLET | Freq: Every day | ORAL | Status: DC
  Administered 2021-03-30 – 2021-04-01 (×3): 1 mg via ORAL
  Filled 2021-03-29 (×3): qty 1

## 2021-03-29 MED ORDER — HYDRALAZINE HCL 20 MG/ML IJ SOLN: 5.0000 mg | INTRAMUSCULAR | Status: DC | PRN

## 2021-03-29 MED ORDER — HYDROMORPHONE HCL 1 MG/ML IJ SOLN
1.0000 mg | Freq: Once | INTRAMUSCULAR | Status: AC
  Administered 2021-03-29: 1 mg via INTRAVENOUS
  Filled 2021-03-29: qty 1

## 2021-03-29 MED ORDER — ONDANSETRON HCL 4 MG/2ML IJ SOLN
4.0000 mg | Freq: Three times a day (TID) | INTRAMUSCULAR | Status: DC | PRN
  Administered 2021-03-29 (×2): 4 mg via INTRAVENOUS
  Filled 2021-03-29 (×2): qty 2

## 2021-03-29 MED ORDER — CLARITHROMYCIN 500 MG PO TABS
500.0000 mg | ORAL_TABLET | Freq: Two times a day (BID) | ORAL | Status: DC
  Filled 2021-03-29 (×3): qty 1

## 2021-03-29 MED ORDER — SENNOSIDES-DOCUSATE SODIUM 8.6-50 MG PO TABS: 1.0000 | ORAL_TABLET | Freq: Every evening | ORAL | Status: DC | PRN

## 2021-03-29 MED ORDER — LACTATED RINGERS IV SOLN: INTRAVENOUS | Status: AC

## 2021-03-29 MED ORDER — NICOTINE 21 MG/24HR TD PT24: 21.0000 mg | MEDICATED_PATCH | Freq: Every day | TRANSDERMAL | Status: DC

## 2021-03-29 MED ORDER — ONDANSETRON HCL 4 MG/2ML IJ SOLN
4.0000 mg | Freq: Four times a day (QID) | INTRAMUSCULAR | Status: DC | PRN
  Administered 2021-04-01: 4 mg via INTRAVENOUS
  Filled 2021-03-29: qty 2

## 2021-03-29 MED ORDER — VITAMIN B-12 1000 MCG PO TABS
1000.0000 ug | ORAL_TABLET | Freq: Every day | ORAL | Status: DC
  Administered 2021-03-30 – 2021-04-01 (×3): 1000 ug via ORAL
  Filled 2021-03-29 (×3): qty 1

## 2021-03-29 MED ORDER — DM-GUAIFENESIN ER 30-600 MG PO TB12
1.0000 | ORAL_TABLET | Freq: Two times a day (BID) | ORAL | Status: DC | PRN
  Filled 2021-03-29: qty 1

## 2021-03-29 MED ORDER — PANTOPRAZOLE SODIUM 40 MG PO TBEC
40.0000 mg | DELAYED_RELEASE_TABLET | Freq: Every day | ORAL | Status: DC
  Administered 2021-03-30 – 2021-04-01 (×3): 40 mg via ORAL
  Filled 2021-03-29 (×3): qty 1

## 2021-03-29 MED ORDER — MORPHINE SULFATE (PF) 4 MG/ML IV SOLN
4.0000 mg | Freq: Once | INTRAVENOUS | Status: AC
Start: 2021-03-29 — End: 2021-03-29
  Administered 2021-03-29: 4 mg via INTRAVENOUS
  Filled 2021-03-29: qty 1

## 2021-03-29 MED ORDER — ALBUTEROL SULFATE HFA 108 (90 BASE) MCG/ACT IN AERS
2.0000 | INHALATION_SPRAY | RESPIRATORY_TRACT | Status: DC | PRN
  Filled 2021-03-29: qty 6.7

## 2021-03-29 NOTE — Plan of Care (Signed)
Pain control and risk of bleeding

## 2021-03-29 NOTE — H&P (Addendum)
History and Physical    Sandra Pearson ZOX:[id number] DOB: Sep 22, 1986 DOA: 03/29/2021  Referring MD/NP/PA:   PCP: Patient, No Pcp Per (Inactive)   Patient coming from:  The patient is coming from home.  At baseline, pt is independent for most of ADL.        Chief Complaint: Abdominal pain  HPI: Sandra Pearson is a 35 y.o. female with medical history significant of GERD with esophagitis, hiatal hernia, tobacco abuse, complex ovarian cyst, anemia, H. pylori infection, who presents with abdominal pain.  Patient was recently hospitalized from 5/17-5/20 due to severe anemia, which is likely due to malabsorption.  Patient has iron and vitamin B12 deficiency.  EGD showed GERD with esophagitis.  Patient was given IV iron and B12 shot.   Patient states that her abdominal pain started yesterday, which has been progressively worsening, much worse since last night.  The abdominal pain is diffuse, sharp, severe, radiating to the back.  Associated with nausea and vomiting, more than 10 times of nonbloody vomiting.  No diarrhea.  Denies fever or chills.  Patient also reports mild shortness of breath, no cough.  She states that she has lower chest pain which seems to be radiated from epigastric abdominal pain.  No symptoms of UTI.  Patient states that she drinks 1 sometimes, but denies heavily drinking. Patient is currently taking amoxicillin and clarithromycin for H. pylori infection (5/23 - 6/6).  ED Course: pt was found to have lipase 390, troponin level 4 --> 4, negative pregnancy test, pending COVID-19 PCR, renal function okay, liver function (ALP 161, AST 67, ALT 43, total bilirubin 1.2, direct bilirubin 0.3), magnesium 1.5, potassium 3.8.  Patient is placed on MedSurg bed for observation.  CT-abd/pelvis:  1. Extensive inflammation surrounding the pancreas, indicative of acute pancreatitis. Associated with this there is a large lesion in the head of the pancreas which is of mixed attenuation.  The appearance of this is favored to represent a pseudocyst, although the possibility of internal hemorrhage within the pseudocyst is suspected, particularly given the markedly dilated vessels in the surrounding pancreaticoduodenal arcade. Alternatively, a hemorrhagic mass is difficult to exclude, but is not strongly favored given the patient's young age. This could be further characterized with abdominal MRI with and without IV gadolinium with MRCP. 2. Cholelithiasis without evidence of acute cholecystitis at this time. 3. Hepatomegaly with severe hepatic steatosis. 4. Splenomegaly.   Review of Systems:   General: no fevers, chills, no body weight gain, has poor appetite, has fatigue HEENT: no blurry vision, hearing changes or sore throat Respiratory: has dyspnea, no coughing, wheezing CV: has lower chest pain, no palpitations GI: has nausea, vomiting, abdominal pain, no diarrhea, constipation GU: no dysuria, burning on urination, increased urinary frequency, hematuria  Ext: no leg edema Neuro: no unilateral weakness, numbness, or tingling, no vision change or hearing loss Skin: no rash, no skin tear. MSK: No muscle spasm, no deformity, no limitation of range of movement in spin Heme: No easy bruising.  Travel history: No recent long distant travel.  Allergy: No Known Allergies  Past Medical History:  Diagnosis Date  . Anemia   . Hypertension     Past Surgical History:  Procedure Laterality Date  . COLONOSCOPY N/A 03/15/2021   Procedure: COLONOSCOPY;  Surgeon: Pasty Spillers, MD;  Location: [facility] ENDOSCOPY;  Service: Endoscopy;  Laterality: N/A;  . ESOPHAGOGASTRODUODENOSCOPY N/A 03/15/2021   Procedure: ESOPHAGOGASTRODUODENOSCOPY (EGD);  Surgeon: Pasty Spillers, MD;  Location: [facility] ENDOSCOPY;  Service: Endoscopy;  Laterality:  N/A;  . MANDIBLE FRACTURE SURGERY      Social History:  reports that she has been smoking cigarettes. She has been smoking about 1.00  pack per day. She has never used smokeless tobacco. She reports that she does not drink alcohol and does not use drugs.  Family History:  Family History  Problem Relation Age of Onset  . Hepatitis C Mother   . Heart disease Mother      Prior to Admission medications   Medication Sig Start Date End Date Taking? Authorizing Provider  amoxicillin (AMOXIL) 500 MG tablet Take 2 tablets (1,000 mg total) by mouth 2 (two) times daily for 14 days. 03/18/21 04/01/21  Pasty Spillers, MD  clarithromycin (BIAXIN) 500 MG tablet Take 1 tablet (500 mg total) by mouth 2 (two) times daily for 14 days. 03/18/21 04/01/21  Pasty Spillers, MD  ferrous sulfate 325 (65 FE) MG tablet Take 1 tablet (325 mg total) by mouth daily. 03/15/21 04/14/21  Marrion Coy, MD  folic acid (FOLVITE) 1 MG tablet Take 1 tablet (1 mg total) by mouth daily. Patient not taking: No sig reported 02/18/19   Triplett, Rulon Eisenmenger B, FNP  gabapentin (NEURONTIN) 100 MG capsule Take 1 capsule (100 mg total) by mouth 3 (three) times daily as needed. 02/18/19 05/19/19  Triplett, Rulon Eisenmenger B, FNP  ondansetron (ZOFRAN) 4 MG tablet Take 1 tablet (4 mg total) by mouth every 8 (eight) hours as needed for nausea or vomiting. Patient not taking: No sig reported 02/18/19   Kem Boroughs B, FNP  oxyCODONE-acetaminophen (PERCOCET/ROXICET) 5-325 MG tablet Take 1 tablet by mouth every 4 (four) hours as needed for severe pain. Patient not taking: No sig reported 03/18/20   Irean Hong, MD  pantoprazole (PROTONIX) 20 MG tablet Take 1 tablet (20 mg total) by mouth in the morning and at bedtime for 14 days. 03/18/21 04/01/21  Pasty Spillers, MD  promethazine (PHENERGAN) 25 MG tablet Take 1 tablet (25 mg total) by mouth every 6 (six) hours as needed for nausea or vomiting. Patient not taking: No sig reported 03/06/19   Minna Antis, MD  traMADol (ULTRAM) 50 MG tablet Take 1 tablet (50 mg total) by mouth every 6 (six) hours as needed. Patient not taking: No sig  reported 02/18/19   Kem Boroughs B, FNP  vitamin B-12 (CYANOCOBALAMIN) 1000 MCG tablet Take 1 tablet (1,000 mcg total) by mouth daily. 03/15/21   Marrion Coy, MD    Physical Exam: Vitals:   03/29/21 1217 03/29/21 1245 03/29/21 1251 03/29/21 1322  BP:   (!) 146/96 (!) 142/95  Pulse: 80 84 91 89  Resp: 12 12 17    Temp:    98.3 F (36.8 C)  TempSrc:    Oral  SpO2: 97% 97% 99% 100%  Weight:      Height:       General: Not in acute distress HEENT:       Eyes: PERRL, EOMI, no scleral icterus.       ENT: No discharge from the ears and nose, no pharynx injection, no tonsillar enlargement.        Neck: No JVD, no bruit, no mass felt. Heme: No neck lymph node enlargement. Cardiac: S1/S2, RRR, No murmurs, No gallops or rubs. Respiratory: No rales, wheezing, rhonchi or rubs. GI: Soft, nondistended, has diffused tenderness, no rebound pain, BS present. GU: No hematuria Ext: No pitting leg edema bilaterally. 1+DP/PT pulse bilaterally. Musculoskeletal: No joint deformities, No joint redness or warmth, no limitation  of ROM in spin. Skin: No rashes.  Neuro: Alert, oriented X3, cranial nerves II-XII grossly intact, moves all extremities normally.  Psych: Patient is not psychotic, no suicidal or hemocidal ideation.  Labs on Admission: I have personally reviewed following labs and imaging studies  CBC: Recent Labs  Lab 03/29/21 0023  WBC 8.0  HGB 13.6  HCT 44.6  MCV 85.9  PLT 307   Basic Metabolic Panel: Recent Labs  Lab 03/29/21 0023 03/29/21 0246  NA 138  --   K 3.8  --   CL 106  --   CO2 20*  --   GLUCOSE 184*  --   BUN <5*  --   CREATININE 0.52  --   CALCIUM 9.4  --   MG  --  1.5*  PHOS 4.1  --    GFR: Estimated Creatinine Clearance: 89.5 mL/min (by C-G formula based on SCr of 0.52 mg/dL). Liver Function Tests: Recent Labs  Lab 03/29/21 0246  AST 67*  ALT 43  ALKPHOS 161*  BILITOT 1.2  PROT 7.3  ALBUMIN 3.3*   Recent Labs  Lab 03/29/21 0246  LIPASE 390*    No results for input(s): AMMONIA in the last 168 hours. Coagulation Profile: Recent Labs  Lab 03/29/21 1358  INR 1.1   Cardiac Enzymes: No results for input(s): CKTOTAL, CKMB, CKMBINDEX, TROPONINI in the last 168 hours. BNP (last 3 results) No results for input(s): PROBNP in the last 8760 hours. HbA1C: No results for input(s): HGBA1C in the last 72 hours. CBG: No results for input(s): GLUCAP in the last 168 hours. Lipid Profile: Recent Labs    03/29/21 0023  TRIG 133   Thyroid Function Tests: No results for input(s): TSH, T4TOTAL, FREET4, T3FREE, THYROIDAB in the last 72 hours. Anemia Panel: No results for input(s): VITAMINB12, FOLATE, FERRITIN, TIBC, IRON, RETICCTPCT in the last 72 hours. Urine analysis:    Component Value Date/Time   COLORURINE AMBER (A) 03/05/2019 0318   APPEARANCEUR CLOUDY (A) 03/05/2019 0318   LABSPEC 1.018 03/05/2019 0318   PHURINE 5.0 03/05/2019 0318   GLUCOSEU NEGATIVE 03/05/2019 0318   HGBUR NEGATIVE 03/05/2019 0318   BILIRUBINUR NEGATIVE 03/05/2019 0318   KETONESUR 20 (A) 03/05/2019 0318   PROTEINUR NEGATIVE 03/05/2019 0318   NITRITE NEGATIVE 03/05/2019 0318   LEUKOCYTESUR NEGATIVE 03/05/2019 0318   Sepsis Labs: @LABRCNTIP (procalcitonin:4,lacticidven:4) ) Recent Results (from the past 240 hour(s))  Resp Panel by RT-PCR (Flu A&B, Covid) Nasopharyngeal Swab     Status: None   Collection Time: 03/29/21  7:31 AM   Specimen: Nasopharyngeal Swab; Nasopharyngeal(NP) swabs in vial transport medium  Result Value Ref Range Status   SARS Coronavirus 2 by RT PCR NEGATIVE NEGATIVE Final    Comment: (NOTE) SARS-CoV-2 target nucleic acids are NOT DETECTED.  The SARS-CoV-2 RNA is generally detectable in upper respiratory specimens during the acute phase of infection. The lowest concentration of SARS-CoV-2 viral copies this assay can detect is 138 copies/mL. A negative result does not preclude SARS-Cov-2 infection and should not be used as the  sole basis for treatment or other patient management decisions. A negative result may occur with  improper specimen collection/handling, submission of specimen other than nasopharyngeal swab, presence of viral mutation(s) within the areas targeted by this assay, and inadequate number of viral copies(<138 copies/mL). A negative result must be combined with clinical observations, patient history, and epidemiological information. The expected result is Negative.  Fact Sheet for Patients:  BloggerCourse.com  Fact Sheet for Healthcare Providers:  SeriousBroker.it  This test is no t yet approved or cleared by the [place] and  has been authorized for detection and/or diagnosis of SARS-CoV-2 by FDA under an Emergency Use Authorization (EUA). This EUA will remain  in effect (meaning this test can be used) for the duration of the COVID-19 declaration under Section 564(b)(1) of the Act, 21 U.S.C.section 360bbb-3(b)(1), unless the authorization is terminated  or revoked sooner.       Influenza A by PCR NEGATIVE NEGATIVE Final   Influenza B by PCR NEGATIVE NEGATIVE Final    Comment: (NOTE) The Xpert Xpress SARS-CoV-2/FLU/RSV plus assay is intended as an aid in the diagnosis of influenza from Nasopharyngeal swab specimens and should not be used as a sole basis for treatment. Nasal washings and aspirates are unacceptable for Xpert Xpress SARS-CoV-2/FLU/RSV testing.  Fact Sheet for Patients: BloggerCourse.com  Fact Sheet for Healthcare Providers: SeriousBroker.it  This test is not yet approved or cleared by the [place] FDA and has been authorized for detection and/or diagnosis of SARS-CoV-2 by FDA under an Emergency Use Authorization (EUA). This EUA will remain in effect (meaning this test can be used) for the duration of the COVID-19 declaration under Section 564(b)(1) of the  Act, 21 U.S.C. section 360bbb-3(b)(1), unless the authorization is terminated or revoked.  Performed at [facility], [address] [place], [place] [address]      Radiological Exams on Admission: DG Chest 2 View  Result Date: 03/29/2021 CLINICAL DATA:  Chest pain EXAM: CHEST - 2 VIEW COMPARISON:  None. FINDINGS: The heart size and mediastinal contours are within normal limits. Both lungs are clear. The visualized skeletal structures are unremarkable. IMPRESSION: No active cardiopulmonary disease. Electronically Signed   By: Deatra Robinson M.D.   On: 03/29/2021 01:12   MR ABDOMEN W WO CONTRAST  Addendum Date: 03/29/2021   ADDENDUM REPORT: 03/29/2021 12:36 ADDENDUM: These results were called by telephone at the time of interpretation on 03/29/2021 at 12:36 pm to provider Lorretta Harp , who verbally acknowledged these results. Electronically Signed   By: Donzetta Kohut M.D.   On: 03/29/2021 12:36   Result Date: 03/29/2021 CLINICAL DATA:  35 year old female with abdominal pain and pancreatitis, history of masslike area in the head of the pancreas EXAM: MRI ABDOMEN WITHOUT AND WITH CONTRAST TECHNIQUE: Multiplanar multisequence MR imaging of the abdomen was performed both before and after the administration of intravenous contrast. CONTRAST:  5mL GADAVIST GADOBUTROL 1 MMOL/ML IV SOLN COMPARISON:  CT evaluation of March 29, 2021. FINDINGS: Lower chest: Lung bases without effusion or sign of consolidative change. Hepatobiliary: Hepatic steatosis with mildly heterogeneous perfusion of the liver. Cyst in the RIGHT hepatic lobe. No focal, suspicious hepatic lesion. The portal vein remains patent despite signs of pancreatitis. SMV is patent. Splenic vein is patent. Biliary tree with caliber change at the site of mass effect from the pancreatic lesion. Mild distension of the common bile duct up to 5 mm at this level. No signs of filling defect. Cholelithiasis or tumefactive sludge in the gallbladder.  Pancreas: In the pancreatic head is a hemorrhagic collection without internal enhancement that measures 5.7 x 5.2 x 5.7 cm. No visible pancreatic tissue is noted in the head or uncinate. The neck and tail of the pancreas shows normal intrinsic T1 signal. There is no ductal dilation of the dorsal pancreatic duct. There is extensive interstitial edematous change throughout the peripheral remaining pancreas and signs of peripancreatic stranding tracking into the root of the mesentery and the  transverse mesocolon. Spleen: Spleen without focal, suspicious lesion. 13 cm greatest craniocaudal dimension. Adrenals/Urinary Tract: Adrenal glands are normal. No perinephric stranding. No hydronephrosis. Smooth renal contours. No suspicious renal lesion. Stomach/Bowel: No acute gastrointestinal process aside from presumed secondary bowel edema in the setting of severe acute interstitial edematous pancreatitis and signs of necrotic pancreatitis as above. Vascular/Lymphatic: Hypertrophied GDA pathways bridging inferior pancreaticoduodenal collaterals to the SMA in the absence of stenosis of the SMA. There is some narrowing of the celiac that is suggested on prior CT imaging likely due to median arcuate ligament impression, this is likely a collateral network as result of this abnormality. Patent portal vein, splenic vein and SMV. No adenopathy Other: Trace ascites. Musculoskeletal: No suspicious bone lesions identified. IMPRESSION: 1. Hemorrhagic area of walled off necrosis is large and replaces head and uncinate process of the pancreas and is intimately associated with hypertrophied collateral pathways between the SMA and celiac. 2. Celiac flow may be dependent upon this pathway and this collateral network could be at risk of bleeding given proximity to the large area of walled off necrosis. Close attention on follow-up is suggested. 3. Given that the collateral pathway may supply of much of the celiac flow, this is of importance  should there be a need for future intervention. 4. Severe acute interstitial edematous pancreatitis involving the pancreatic body and tail. No ductal dilation. Note that given the proximity of the necrotic collection there is a risk for ductal interruption and the duct cannot be clearly tracked through the neck of the pancreas to the ampulla. 5. Extrinsic compression upon the common bile duct and signs of tumefactive sludge or stones in the gallbladder lumen. 6. Marked hepatic steatosis. Variable perfusion/enhancement of the liver could indicate developing liver disease or be related to above findings. No suspicious hepatic lesion. Trace ascites. 7. Mild splenomegaly. A call is out to the referring provider to further discuss findings in the above case. Electronically Signed: By: Donzetta Kohut M.D. On: 03/29/2021 12:29   CT ABDOMEN PELVIS W CONTRAST  Result Date: 03/29/2021 CLINICAL DATA:  35 year old female with history of acute onset of nonlocalized abdominal pain. EXAM: CT ABDOMEN AND PELVIS WITH CONTRAST TECHNIQUE: Multidetector CT imaging of the abdomen and pelvis was performed using the standard protocol following bolus administration of intravenous contrast. CONTRAST:  75mL OMNIPAQUE IOHEXOL 300 MG/ML  SOLN COMPARISON:  CT of the abdomen and pelvis 11/23/2018. FINDINGS: Lower chest: Small amount of subsegmental atelectasis in the right lower lobe. Hepatobiliary: Liver is enlarged measuring 24.8 cm in craniocaudal span. Heterogeneous areas of low attenuation are noted throughout the hepatic parenchyma, indicative of heterogeneous hepatic steatosis. No discrete cystic or solid hepatic lesions. No intra or extrahepatic biliary ductal dilatation. Gallbladder is nearly completely decompressed, but appears to contain several noncalcified gallstones. Pancreas: In the head of the pancreas (axial image 39 of series 2 and coronal image 30 of series 5) there is a large 5.4 x 4.8 x 5.6 cm rounded lesion of  heterogeneous internal attenuation ranging from low to high attenuation. This exerts mass effect upon adjacent structures. Ectasia of the main pancreatic duct throughout the body and tail of the pancreas. Inflammatory changes are noted surrounding the entire pancreas extending into the adjacent retroperitoneal fat. Spleen: Spleen is enlarged measuring 13.3 x 5.6 x 14.4 cm (estimated splenic volume of 536 mL) . Adrenals/Urinary Tract: Bilateral kidneys and adrenal glands are normal in appearance. No hydroureteronephrosis. Urinary bladder is normal in appearance. Stomach/Bowel: Appearance of the stomach is normal.  There is no pathologic dilatation of small bowel or colon. Normal appendix. Vascular/Lymphatic: No significant atherosclerotic disease, aneurysm or dissection noted in the abdominal or pelvic vasculature. Superior mesenteric vein, splenic vein and portal vein are all patent. Prominent blood vessels surrounding the large lesion in the head of the pancreas, most notably a very prominent pancreaticoduodenal artery arcade. No lymphadenopathy noted in the abdomen or pelvis. Reproductive: Uterus and ovaries are unremarkable in appearance. Other: No significant volume of ascites.  No pneumoperitoneum. Musculoskeletal: There are no aggressive appearing lytic or blastic lesions noted in the visualized portions of the skeleton. IMPRESSION: 1. Extensive inflammation surrounding the pancreas, indicative of acute pancreatitis. Associated with this there is a large lesion in the head of the pancreas which is of mixed attenuation. The appearance of this is favored to represent a pseudocyst, although the possibility of internal hemorrhage within the pseudocyst is suspected, particularly given the markedly dilated vessels in the surrounding pancreaticoduodenal arcade. Alternatively, a hemorrhagic mass is difficult to exclude, but is not strongly favored given the patient's young age. This could be further characterized with  abdominal MRI with and without IV gadolinium with MRCP. 2. Cholelithiasis without evidence of acute cholecystitis at this time. 3. Hepatomegaly with severe hepatic steatosis. 4. Splenomegaly. Electronically Signed   By: Trudie Reed M.D.   On: 03/29/2021 06:47   MR 3D Recon At Scanner  Addendum Date: 03/29/2021   ADDENDUM REPORT: 03/29/2021 12:36 ADDENDUM: These results were called by telephone at the time of interpretation on 03/29/2021 at 12:36 pm to provider Lorretta Harp , who verbally acknowledged these results. Electronically Signed   By: Donzetta Kohut M.D.   On: 03/29/2021 12:36   Result Date: 03/29/2021 CLINICAL DATA:  35 year old female with abdominal pain and pancreatitis, history of masslike area in the head of the pancreas EXAM: MRI ABDOMEN WITHOUT AND WITH CONTRAST TECHNIQUE: Multiplanar multisequence MR imaging of the abdomen was performed both before and after the administration of intravenous contrast. CONTRAST:  5mL GADAVIST GADOBUTROL 1 MMOL/ML IV SOLN COMPARISON:  CT evaluation of March 29, 2021. FINDINGS: Lower chest: Lung bases without effusion or sign of consolidative change. Hepatobiliary: Hepatic steatosis with mildly heterogeneous perfusion of the liver. Cyst in the RIGHT hepatic lobe. No focal, suspicious hepatic lesion. The portal vein remains patent despite signs of pancreatitis. SMV is patent. Splenic vein is patent. Biliary tree with caliber change at the site of mass effect from the pancreatic lesion. Mild distension of the common bile duct up to 5 mm at this level. No signs of filling defect. Cholelithiasis or tumefactive sludge in the gallbladder. Pancreas: In the pancreatic head is a hemorrhagic collection without internal enhancement that measures 5.7 x 5.2 x 5.7 cm. No visible pancreatic tissue is noted in the head or uncinate. The neck and tail of the pancreas shows normal intrinsic T1 signal. There is no ductal dilation of the dorsal pancreatic duct. There is extensive  interstitial edematous change throughout the peripheral remaining pancreas and signs of peripancreatic stranding tracking into the root of the mesentery and the transverse mesocolon. Spleen: Spleen without focal, suspicious lesion. 13 cm greatest craniocaudal dimension. Adrenals/Urinary Tract: Adrenal glands are normal. No perinephric stranding. No hydronephrosis. Smooth renal contours. No suspicious renal lesion. Stomach/Bowel: No acute gastrointestinal process aside from presumed secondary bowel edema in the setting of severe acute interstitial edematous pancreatitis and signs of necrotic pancreatitis as above. Vascular/Lymphatic: Hypertrophied GDA pathways bridging inferior pancreaticoduodenal collaterals to the SMA in the absence of stenosis of  the SMA. There is some narrowing of the celiac that is suggested on prior CT imaging likely due to median arcuate ligament impression, this is likely a collateral network as result of this abnormality. Patent portal vein, splenic vein and SMV. No adenopathy Other: Trace ascites. Musculoskeletal: No suspicious bone lesions identified. IMPRESSION: 1. Hemorrhagic area of walled off necrosis is large and replaces head and uncinate process of the pancreas and is intimately associated with hypertrophied collateral pathways between the SMA and celiac. 2. Celiac flow may be dependent upon this pathway and this collateral network could be at risk of bleeding given proximity to the large area of walled off necrosis. Close attention on follow-up is suggested. 3. Given that the collateral pathway may supply of much of the celiac flow, this is of importance should there be a need for future intervention. 4. Severe acute interstitial edematous pancreatitis involving the pancreatic body and tail. No ductal dilation. Note that given the proximity of the necrotic collection there is a risk for ductal interruption and the duct cannot be clearly tracked through the neck of the pancreas to  the ampulla. 5. Extrinsic compression upon the common bile duct and signs of tumefactive sludge or stones in the gallbladder lumen. 6. Marked hepatic steatosis. Variable perfusion/enhancement of the liver could indicate developing liver disease or be related to above findings. No suspicious hepatic lesion. Trace ascites. 7. Mild splenomegaly. A call is out to the referring provider to further discuss findings in the above case. Electronically Signed: By: Donzetta Kohut M.D. On: 03/29/2021 12:29     EKG: I have personally reviewed.  Sinus rhythm, QTC 481, nonspecific T wave change  Assessment/Plan Principal Problem:   Acute pancreatitis Active Problems:   Hypomagnesemia   Normocytic anemia   GERD with esophagitis   H. pylori infection   Chest pain   Acute pancreatitis: lipase 390.  CT scan showed extensive inflammation surrounding the pancreas, indicative of acute pancreatitis. CT also showed a large lesion in the head of the pancreas which is of mixed attenuation, likely represent a pseudocyst, but internal hemorrhage within the pseudocyst is also possible, also need to r/o hemorrhagic mass.  -will place in med-surg bed for observation -NPO for pancreatitis -IVF: 2L LR, and then at 150 cc/hr -prn IV dilaudid and oxycodone for pain control -prn IV zofran for nausea -check triglyceride level --> 133 -will get MRI-abd to further categorize pseudocyst -consulted Dr. 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 This collateral network could be at risk of bleeding given proximity to the large area of walled off necrosis. [facility], Dr. Wyn Quaker is consulted. Per Dr. Wyn Quaker, there is very limited ability to embolize this area in [facility]. Both Dr. Wyn Quaker of [facility] and Dr. Allegra Lai of GI recommended to transfer pt to [facility] hospital.   MRI-abd with and without contrast showed; 1. Hemorrhagic area of walled off necrosis is large and replaces head  and uncinate process of the pancreas and is intimately associated with hypertrophied collateral pathways between the SMA and celiac. 2. Celiac flow may be dependent upon this pathway and this collateral network could be at risk of bleeding given proximity to the large area of walled off necrosis. Close attention on follow-up is suggested. 3. Given that the collateral pathway may supply of much of the celiac flow, this is of importance should there be a need for future intervention. 4. 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 No ductal dilation. Note that given the proximity of the necrotic collection there is a risk for ductal interruption and the duct cannot be clearly tracked through the neck of the pancreas to the ampulla. 5. Extrinsic compression upon the common bile duct and signs of tumefactive sludge or stones in the gallbladder lumen. 6. Marked hepatic steatosis. Variable perfusion/enhancement of the liver could indicate developing liver disease or be related to above findings. No suspicious hepatic lesion. Trace ascites. 7. Mild splenomegaly.  Hypomagnesemia: Magnesium 1.5, potassium 3.8, phosphorus 4.1 -2 g of IV magnesium sulfate  Normocytic anemia: Hemoglobin 13.6. -Continue iron supplement, vitamin B12  GERD with esophagitis -Protonix  H. pylori infection -Continue amoxicillin and clarithromycin (supposed to take from 5/23-6/6)  Chest pain: Very atypical chest pain. Patient reports lower chest pain, which seem to be radiated from epigastric abdominal pain.  Troponin level 4 -->4.  -Observe closely.  No further work-up now.    DVT ppx: SCD Code Status: Full code Family Communication: not done, no family member is at bed side.      Disposition Plan:  Anticipate discharge back to previous environment Consults called:  none Admission status and Level of care: Med-Surg:    obs    Status is: Observation  The  patient remains OBS appropriate and will d/c before 2 midnights.  Dispo: The patient is from: Home              Anticipated d/c is to: Home              Patient currently is not medically stable to d/c.   Difficult to place patient No         Date of Service 03/29/2021    Lorretta Harp [facility]   If 7PM-7AM, please contact night-coverage [email or web address] 03/29/2021, 3:10 PM

## 2021-03-29 NOTE — ED Notes (Signed)
Patient is back from MRI  

## 2021-03-29 NOTE — ED Notes (Signed)
Pt given phone to complete MRI screening questions at this time

## 2021-03-29 NOTE — H&P (Addendum)
History and Physical  Sandra Pearson UJW:[id number] DOB: 05/11/1986 DOA: 03/29/2021  Referring physician: Direct transfer from [facility] to [facility] telemetry surgical. PCP: Patient, No Pcp Per (Inactive)  Outpatient Specialists: GI, OB/GYN. Patient coming from: Direct transfer from [facility] to [facility].  Initially admitted at [facility] from home.  Chief Complaint: Abdominal pain.  Reason for transfer: Acute severe pancreatitis with walled off necrosis.  HPI: Sandra Pearson is a 35 y.o. female with medical history significant for GERD, esophagitis, hiatal hernia, complex ovarian cyst, iron deficiency anemia, H. pylori infection, tobacco use disorder, alcohol use disorder, alcohol induced acute pancreatitis in 2020 who was initially admitted at [facility] due to severe acute pancreatitis with walled off necrosis, hemorrhage and hypertrophic collaterals within the celiac and superior mesenteric artery.  She was seen by vascular surgery and GI at [facility].  They recommended transfer to tertiary care center as the patient is at high risk for any major bleeding into the pancreas.  General surgery consultation was also recommended.  Patient has been admitted as a direct transfer by Dr. Ophelia Charter.  At the time of this visit she reports her abdominal pain is sharp, 5 out of 10 about an hour after receiving IV Dilaudid 1 mg, radiating to her back.  Persistent nausea without vomiting since arriving at [facility].  IV antiemetics in place as needed.  ED Course:  Direct admission.  Temperature 98.4.  BP 123/82, pulse 86, respiratory 15, O2 saturation 100% on room air.  Lab studies remarkable for serum sodium 138, potassium 3.8, serum bicarb 20, serum glucose 184, BUN 5, creatinine 0.52, anion gap 12, magnesium 1.5, alkaline phosphatase 161, lipase 390, AST 67, ALT 43, CBD Ruben 1.2.  Triglyceride 133.  Troponin 4, 4.  WBC 8.0, hemoglobin 13.6, platelet 307.  Chest x-ray no cardiorpulmonary active disease.  UA is pending.  Review of  Systems: Review of systems as noted in the HPI. All other systems reviewed and are negative.   Past Medical History:  Diagnosis Date  . Anemia   . Hypertension    Past Surgical History:  Procedure Laterality Date  . COLONOSCOPY N/A 03/15/2021   Procedure: COLONOSCOPY;  Surgeon: Pasty Spillers, MD;  Location: [facility] ENDOSCOPY;  Service: Endoscopy;  Laterality: N/A;  . ESOPHAGOGASTRODUODENOSCOPY N/A 03/15/2021   Procedure: ESOPHAGOGASTRODUODENOSCOPY (EGD);  Surgeon: Pasty Spillers, MD;  Location: [facility] ENDOSCOPY;  Service: Endoscopy;  Laterality: N/A;  . MANDIBLE FRACTURE SURGERY      Social History:  reports that she has been smoking cigarettes. She has been smoking about 1.00 pack per day. She has never used smokeless tobacco. She reports that she does not drink alcohol and does not use drugs.   No Known Allergies  Family History  Problem Relation Age of Onset  . Hepatitis C Mother   . Heart disease Mother       Prior to Admission medications   Medication Sig Start Date End Date Taking? Authorizing Provider  amoxicillin (AMOXIL) 500 MG tablet Take 2 tablets (1,000 mg total) by mouth 2 (two) times daily for 14 days. 03/18/21 04/01/21  Pasty Spillers, MD  clarithromycin (BIAXIN) 500 MG tablet Take 1 tablet (500 mg total) by mouth 2 (two) times daily for 14 days. 03/18/21 04/01/21  Pasty Spillers, MD  ferrous sulfate 325 (65 FE) MG tablet Take 1 tablet (325 mg total) by mouth daily. 03/15/21 04/14/21  Marrion Coy, MD  folic acid (FOLVITE) 1 MG tablet Take 1 tablet (1 mg total) by mouth daily. Patient  not taking: No sig reported 02/18/19   Triplett, Rulon Eisenmenger B, FNP  gabapentin (NEURONTIN) 100 MG capsule Take 1 capsule (100 mg total) by mouth 3 (three) times daily as needed. 02/18/19 05/19/19  Triplett, Rulon Eisenmenger B, FNP  ondansetron (ZOFRAN) 4 MG tablet Take 1 tablet (4 mg total) by mouth every 8 (eight) hours as needed for nausea or vomiting. Patient not taking: No sig reported  02/18/19   Kem Boroughs B, FNP  oxyCODONE-acetaminophen (PERCOCET/ROXICET) 5-325 MG tablet Take 1 tablet by mouth every 4 (four) hours as needed for severe pain. Patient not taking: No sig reported 03/18/20   Irean Hong, MD  pantoprazole (PROTONIX) 20 MG tablet Take 1 tablet (20 mg total) by mouth in the morning and at bedtime for 14 days. 03/18/21 04/01/21  Pasty Spillers, MD  promethazine (PHENERGAN) 25 MG tablet Take 1 tablet (25 mg total) by mouth every 6 (six) hours as needed for nausea or vomiting. Patient not taking: No sig reported 03/06/19   Minna Antis, MD  traMADol (ULTRAM) 50 MG tablet Take 1 tablet (50 mg total) by mouth every 6 (six) hours as needed. Patient not taking: No sig reported 02/18/19   Kem Boroughs B, FNP  vitamin B-12 (CYANOCOBALAMIN) 1000 MCG tablet Take 1 tablet (1,000 mcg total) by mouth daily. 03/15/21   Marrion Coy, MD    Physical Exam: There were no vitals taken for this visit.  . General: 35 y.o. year-old female well developed well nourished in no acute distress.  Alert and oriented x3. . Cardiovascular: Regular rate and rhythm with no rubs or gallops.  No thyromegaly or JVD noted.  No lower extremity edema. 2/4 pulses in all 4 extremities. Marland Kitchen Respiratory: Clear to auscultation with no wheezes or rales. Good inspiratory effort. . Abdomen: Soft mild tenderness diffusely with palpation.  Nondistended with normal bowel sounds x4 quadrants. . Muskuloskeletal: No cyanosis, clubbing or edema noted bilaterally . Neuro: CN II-XII intact, strength, sensation, reflexes . Skin: No ulcerative lesions noted or rashes . Psychiatry: Judgement and insight appear normal. Mood is appropriate for condition and setting          Labs on Admission:  Basic Metabolic Panel: Recent Labs  Lab 03/29/21 0023 03/29/21 0246  NA 138  --   K 3.8  --   CL 106  --   CO2 20*  --   GLUCOSE 184*  --   BUN <5*  --   CREATININE 0.52  --   CALCIUM 9.4  --   MG  --  1.5*   PHOS 4.1  --    Liver Function Tests: Recent Labs  Lab 03/29/21 0246  AST 67*  ALT 43  ALKPHOS 161*  BILITOT 1.2  PROT 7.3  ALBUMIN 3.3*   Recent Labs  Lab 03/29/21 0246  LIPASE 390*   No results for input(s): AMMONIA in the last 168 hours. CBC: Recent Labs  Lab 03/29/21 0023  WBC 8.0  HGB 13.6  HCT 44.6  MCV 85.9  PLT 307   Cardiac Enzymes: No results for input(s): CKTOTAL, CKMB, CKMBINDEX, TROPONINI in the last 168 hours.  BNP (last 3 results) No results for input(s): BNP in the last 8760 hours.  ProBNP (last 3 results) No results for input(s): PROBNP in the last 8760 hours.  CBG: Recent Labs  Lab 03/30/21 0009  GLUCAP 107*    Radiological Exams on Admission: DG Chest 2 View  Result Date: 03/29/2021 CLINICAL DATA:  Chest pain EXAM: CHEST -  2 VIEW COMPARISON:  None. FINDINGS: The heart size and mediastinal contours are within normal limits. Both lungs are clear. The visualized skeletal structures are unremarkable. IMPRESSION: No active cardiopulmonary disease. Electronically Signed   By: Deatra Robinson M.D.   On: 03/29/2021 01:12   MR ABDOMEN W WO CONTRAST  Addendum Date: 03/29/2021   ADDENDUM REPORT: 03/29/2021 12:36 ADDENDUM: These results were called by telephone at the time of interpretation on 03/29/2021 at 12:36 pm to provider Lorretta Harp , who verbally acknowledged these results. Electronically Signed   By: Donzetta Kohut M.D.   On: 03/29/2021 12:36   Result Date: 03/29/2021 CLINICAL DATA:  35 year old female with abdominal pain and pancreatitis, history of masslike area in the head of the pancreas EXAM: MRI ABDOMEN WITHOUT AND WITH CONTRAST TECHNIQUE: Multiplanar multisequence MR imaging of the abdomen was performed both before and after the administration of intravenous contrast. CONTRAST:  5mL GADAVIST GADOBUTROL 1 MMOL/ML IV SOLN COMPARISON:  CT evaluation of March 29, 2021. FINDINGS: Lower chest: Lung bases without effusion or sign of consolidative change.  Hepatobiliary: Hepatic steatosis with mildly heterogeneous perfusion of the liver. Cyst in the RIGHT hepatic lobe. No focal, suspicious hepatic lesion. The portal vein remains patent despite signs of pancreatitis. SMV is patent. Splenic vein is patent. Biliary tree with caliber change at the site of mass effect from the pancreatic lesion. Mild distension of the common bile duct up to 5 mm at this level. No signs of filling defect. Cholelithiasis or tumefactive sludge in the gallbladder. Pancreas: In the pancreatic head is a hemorrhagic collection without internal enhancement that measures 5.7 x 5.2 x 5.7 cm. No visible pancreatic tissue is noted in the head or uncinate. The neck and tail of the pancreas shows normal intrinsic T1 signal. There is no ductal dilation of the dorsal pancreatic duct. There is extensive interstitial edematous change throughout the peripheral remaining pancreas and signs of peripancreatic stranding tracking into the root of the mesentery and the transverse mesocolon. Spleen: Spleen without focal, suspicious lesion. 13 cm greatest craniocaudal dimension. Adrenals/Urinary Tract: Adrenal glands are normal. No perinephric stranding. No hydronephrosis. Smooth renal contours. No suspicious renal lesion. Stomach/Bowel: No acute gastrointestinal process aside from presumed secondary bowel edema in the setting of severe acute interstitial edematous pancreatitis and signs of necrotic pancreatitis as above. Vascular/Lymphatic: Hypertrophied GDA pathways bridging inferior pancreaticoduodenal collaterals to the SMA in the absence of stenosis of the SMA. There is some narrowing of the celiac that is suggested on prior CT imaging likely due to median arcuate ligament impression, this is likely a collateral network as result of this abnormality. Patent portal vein, splenic vein and SMV. No adenopathy Other: Trace ascites. Musculoskeletal: No suspicious bone lesions identified. IMPRESSION: 1. Hemorrhagic  area of walled off necrosis is large and replaces head and uncinate process of the pancreas and is intimately associated with hypertrophied collateral pathways between the SMA and celiac. 2. Celiac flow may be dependent upon this pathway and this collateral network could be at risk of bleeding given proximity to the large area of walled off necrosis. Close attention on follow-up is suggested. 3. Given that the collateral pathway may supply of much of the celiac flow, this is of importance should there be a need for future intervention. 4. Severe acute interstitial edematous pancreatitis involving the pancreatic body and tail. No ductal dilation. Note that given the proximity of the necrotic collection there is a risk for ductal interruption and the duct cannot be clearly tracked through  the neck of the pancreas to the ampulla. 5. Extrinsic compression upon the common bile duct and signs of tumefactive sludge or stones in the gallbladder lumen. 6. Marked hepatic steatosis. Variable perfusion/enhancement of the liver could indicate developing liver disease or be related to above findings. No suspicious hepatic lesion. Trace ascites. 7. Mild splenomegaly. A call is out to the referring provider to further discuss findings in the above case. Electronically Signed: By: Donzetta Kohut M.D. On: 03/29/2021 12:29   CT ABDOMEN PELVIS W CONTRAST  Result Date: 03/29/2021 CLINICAL DATA:  35 year old female with history of acute onset of nonlocalized abdominal pain. EXAM: CT ABDOMEN AND PELVIS WITH CONTRAST TECHNIQUE: Multidetector CT imaging of the abdomen and pelvis was performed using the standard protocol following bolus administration of intravenous contrast. CONTRAST:  75mL OMNIPAQUE IOHEXOL 300 MG/ML  SOLN COMPARISON:  CT of the abdomen and pelvis 11/23/2018. FINDINGS: Lower chest: Small amount of subsegmental atelectasis in the right lower lobe. Hepatobiliary: Liver is enlarged measuring 24.8 cm in craniocaudal span.  Heterogeneous areas of low attenuation are noted throughout the hepatic parenchyma, indicative of heterogeneous hepatic steatosis. No discrete cystic or solid hepatic lesions. No intra or extrahepatic biliary ductal dilatation. Gallbladder is nearly completely decompressed, but appears to contain several noncalcified gallstones. Pancreas: In the head of the pancreas (axial image 39 of series 2 and coronal image 30 of series 5) there is a large 5.4 x 4.8 x 5.6 cm rounded lesion of heterogeneous internal attenuation ranging from low to high attenuation. This exerts mass effect upon adjacent structures. Ectasia of the main pancreatic duct throughout the body and tail of the pancreas. Inflammatory changes are noted surrounding the entire pancreas extending into the adjacent retroperitoneal fat. Spleen: Spleen is enlarged measuring 13.3 x 5.6 x 14.4 cm (estimated splenic volume of 536 mL) . Adrenals/Urinary Tract: Bilateral kidneys and adrenal glands are normal in appearance. No hydroureteronephrosis. Urinary bladder is normal in appearance. Stomach/Bowel: Appearance of the stomach is normal. There is no pathologic dilatation of small bowel or colon. Normal appendix. Vascular/Lymphatic: No significant atherosclerotic disease, aneurysm or dissection noted in the abdominal or pelvic vasculature. Superior mesenteric vein, splenic vein and portal vein are all patent. Prominent blood vessels surrounding the large lesion in the head of the pancreas, most notably a very prominent pancreaticoduodenal artery arcade. No lymphadenopathy noted in the abdomen or pelvis. Reproductive: Uterus and ovaries are unremarkable in appearance. Other: No significant volume of ascites.  No pneumoperitoneum. Musculoskeletal: There are no aggressive appearing lytic or blastic lesions noted in the visualized portions of the skeleton. IMPRESSION: 1. Extensive inflammation surrounding the pancreas, indicative of acute pancreatitis. Associated with  this there is a large lesion in the head of the pancreas which is of mixed attenuation. The appearance of this is favored to represent a pseudocyst, although the possibility of internal hemorrhage within the pseudocyst is suspected, particularly given the markedly dilated vessels in the surrounding pancreaticoduodenal arcade. Alternatively, a hemorrhagic mass is difficult to exclude, but is not strongly favored given the patient's young age. This could be further characterized with abdominal MRI with and without IV gadolinium with MRCP. 2. Cholelithiasis without evidence of acute cholecystitis at this time. 3. Hepatomegaly with severe hepatic steatosis. 4. Splenomegaly. Electronically Signed   By: Trudie Reed M.D.   On: 03/29/2021 06:47   MR 3D Recon At Scanner  Addendum Date: 03/29/2021   ADDENDUM REPORT: 03/29/2021 12:36 ADDENDUM: These results were called by telephone at the time of interpretation on  03/29/2021 at 12:36 pm to provider Lorretta Harp , who verbally acknowledged these results. Electronically Signed   By: Donzetta Kohut M.D.   On: 03/29/2021 12:36   Result Date: 03/29/2021 CLINICAL DATA:  35 year old female with abdominal pain and pancreatitis, history of masslike area in the head of the pancreas EXAM: MRI ABDOMEN WITHOUT AND WITH CONTRAST TECHNIQUE: Multiplanar multisequence MR imaging of the abdomen was performed both before and after the administration of intravenous contrast. CONTRAST:  5mL GADAVIST GADOBUTROL 1 MMOL/ML IV SOLN COMPARISON:  CT evaluation of March 29, 2021. FINDINGS: Lower chest: Lung bases without effusion or sign of consolidative change. Hepatobiliary: Hepatic steatosis with mildly heterogeneous perfusion of the liver. Cyst in the RIGHT hepatic lobe. No focal, suspicious hepatic lesion. The portal vein remains patent despite signs of pancreatitis. SMV is patent. Splenic vein is patent. Biliary tree with caliber change at the site of mass effect from the pancreatic lesion. Mild  distension of the common bile duct up to 5 mm at this level. No signs of filling defect. Cholelithiasis or tumefactive sludge in the gallbladder. Pancreas: In the pancreatic head is a hemorrhagic collection without internal enhancement that measures 5.7 x 5.2 x 5.7 cm. No visible pancreatic tissue is noted in the head or uncinate. The neck and tail of the pancreas shows normal intrinsic T1 signal. There is no ductal dilation of the dorsal pancreatic duct. There is extensive interstitial edematous change throughout the peripheral remaining pancreas and signs of peripancreatic stranding tracking into the root of the mesentery and the transverse mesocolon. Spleen: Spleen without focal, suspicious lesion. 13 cm greatest craniocaudal dimension. Adrenals/Urinary Tract: Adrenal glands are normal. No perinephric stranding. No hydronephrosis. Smooth renal contours. No suspicious renal lesion. Stomach/Bowel: No acute gastrointestinal process aside from presumed secondary bowel edema in the setting of severe acute interstitial edematous pancreatitis and signs of necrotic pancreatitis as above. Vascular/Lymphatic: Hypertrophied GDA pathways bridging inferior pancreaticoduodenal collaterals to the SMA in the absence of stenosis of the SMA. There is some narrowing of the celiac that is suggested on prior CT imaging likely due to median arcuate ligament impression, this is likely a collateral network as result of this abnormality. Patent portal vein, splenic vein and SMV. No adenopathy Other: Trace ascites. Musculoskeletal: No suspicious bone lesions identified. IMPRESSION: 1. Hemorrhagic area of walled off necrosis is large and replaces head and uncinate process of the pancreas and is intimately associated with hypertrophied collateral pathways between the SMA and celiac. 2. Celiac flow may be dependent upon this pathway and this collateral network could be at risk of bleeding given proximity to the large area of walled off  necrosis. Close attention on follow-up is suggested. 3. Given that the collateral pathway may supply of much of the celiac flow, this is of importance should there be a need for future intervention. 4. Severe acute interstitial edematous pancreatitis involving the pancreatic body and tail. No ductal dilation. Note that given the proximity of the necrotic collection there is a risk for ductal interruption and the duct cannot be clearly tracked through the neck of the pancreas to the ampulla. 5. Extrinsic compression upon the common bile duct and signs of tumefactive sludge or stones in the gallbladder lumen. 6. Marked hepatic steatosis. Variable perfusion/enhancement of the liver could indicate developing liver disease or be related to above findings. No suspicious hepatic lesion. Trace ascites. 7. Mild splenomegaly. A call is out to the referring provider to further discuss findings in the above case. Electronically Signed: By:  Donzetta Kohut M.D. On: 03/29/2021 12:29    EKG: I independently viewed the EKG done and my findings are as followed: Sinus rhythm nonspecific ST-T changes.  QTc 469.  Assessment/Plan Present on Admission: . Severe acute pancreatitis  Active Problems:   Severe acute pancreatitis  Acute severe pancreatitis with walled off necrosis, pseudocyst with possibility of internal hemorrhage within the pseudocyst seen on CT scan abdomen and pelvis with contrast done on 03/29/2021. History of alcohol abuse and prior history of alcohol induced pancreatitis. Please review official radiology report. Patient was admitted at [facility] a.m. 03/29/2021 and due to the above findings with concern for increased risk of worsening clinical picture patient was transferred to tertiary center, [facility] for further evaluation and management of present condition. Patient was evaluated by vascular surgery at [facility] Dr. Wyn Quaker who did not recommend active vascular intervention at this time, as they will be extremely difficult  to embolize successfully due to the large number of blood vessels feeding the area, the very large size of the blood vessels and the multiple different vessels that will have to be embolized.   Vascular surgery had recommended general surgery consultation. No evidence of infection at this time. No evidence of biliary obstruction at this time. Continue n.p.o. except for meds Continue IV fluid hydration LR at 150 cc/h x 2 days. Continue pain control and IV antiemetics as needed Avoid NSAIDs. 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 Serial H&H Q6H Start Zosyn empirically Transfuse as indicated GI, General surgery, vascular surgery consulted at [facility]  History of H. pylori infection Prior to admission she was on triple therapy amoxicillin, clarithromycin, and Protonix to add on 04/01/2021. GI consult for further recommendations  Iron deficiency anemia Hemoglobin is stable at 13.6 Continue to monitor H&H  Hypomagnesemia Serum magnesium 1.5 Repleted intravenously Repeat levels in the morning.  Elevated liver chemistries in the setting of acute pancreatitis and chronic alcohol use Monitor LFTs Avoid hepatotoxic agents Repeat CMP in the morning  Alcohol use disorder Alcohol cessation counseling at bedside Self-reported last alcohol intake was 4 days ago No evidence of alcohol withdrawal at time of this visit Monitor for now    DVT prophylaxis: SCDs  Code Status: Full code  Family Communication: None at bedside  Disposition Plan: Admitted to telemetry surgical.  Consults called: General surgery, Dr. Janee Morn, GI, Dr. Meridee Score, Vascular surgery Dr. Darrick Penna.  Admission status: Inpatient status.  Patient will require at least 2  midnights for further evaluation and treatment of present condition.   Status is: Inpatient    Dispo:  Patient From: Home  Planned Disposition: Home possibly on 03/31/2021 or when hemodynamically stable and general surgery and GI have signed off.  Medically stable for discharge: No         Darlin Drop MD [facility] Pager [phone]  If 7PM-7AM, please contact night-coverage [email or web address] Password [email or web address]  03/30/2021, 12:16 AM

## 2021-03-29 NOTE — Progress Notes (Signed)
Patient to directly admit to [facility] Bed 15 tonight. Report called to [place], [phone]. Spoke with Darrin with transport who will arrive approx 8pm

## 2021-03-29 NOTE — ED Notes (Signed)
PATIENT MADE AWARE URINE SAMPLE IS NEEDED, STATES SHE CANNOT VOID AT THIS TIME

## 2021-03-29 NOTE — ED Notes (Addendum)
Pt transported to CT for scan at this time

## 2021-03-29 NOTE — Progress Notes (Signed)
Patient arrived to 6N15, alert and oriented x4,able to make needs known. No acute distress noted. No DOB/SOB/chestpain. 02sat100%room air. VS stable. C/o 7/10 abd pain-TRHadmitting MD made aware. Patient was oriented to call bell and surroundings.call bell within reach and will continue to monitor patient.

## 2021-03-29 NOTE — Consult Note (Signed)
Cephas Darby, MD [address]  [place], [place] [address]  Main: [phone]  Fax: [phone] Pager: [phone]   Consultation  Referring Provider:     No ref. provider found Primary Care Physician:  Patient, No Pcp Per (Inactive) Primary Gastroenterologist:  Dr. Alice Reichert         Reason for Consultation:     Acute pancreatitis  Date of Admission:  03/29/2021 Date of Consultation:  03/29/2021         HPI:   Sandra Pearson is a 35 y.o. female with history of alcohol use, iron deficiency anemia, H. pylori infection is admitted with upper abdominal pain and imaging confirmed severe acute pancreatitis with necrosis and hemorrhage as well as walled off necrosis.  Patient reports that she had sudden onset of severe abdominal pain at 11 PM that brought her to the ER.  Patient was admitted in 2020 secondary to acute alcohol induced pancreatitis.  Patient does admit to drinking alcohol intermittently over the last 2 years, 2 glasses of wine every 3 days or vodka mixed with ginger ale.  She does vape tobacco.  Patient was recently admitted to [facility] secondary to iron deficiency anemia on 5/18, underwent bidirectional endoscopy by Dr. Bonna Gains, found to have H. pylori infection and was started on triple therapy.  Patient reported that she finished 1 week course of antibiotics.  Labs on admission revealed serum lipase 390, alkaline phosphatase 161, total bilirubin 1.2, AST 67, ALT 43, normal BUN/creatinine, normal CBC.  Normal PT/INR.  Patient initially underwent CT abdomen and pelvis with contrast which revealed extensive inflammation surrounding the pancreas indicative of acute pancreatitis and large lesion in the head of the pancreas.  Subsequently underwent MRI/MRCP with findings as below  IMPRESSION: 1. Hemorrhagic area of walled off necrosis is large and replaces head and uncinate process of the pancreas and is intimately associated with hypertrophied collateral pathways  between the SMA and celiac. 2. Celiac flow may be dependent upon this pathway and this collateral network could be at risk of bleeding given proximity to the large area of walled off necrosis. Close attention on follow-up is suggested. 3. Given that the collateral pathway may supply of much of the celiac flow, this is of importance should there be a need for future intervention. 4. Severe acute interstitial edematous pancreatitis involving the pancreatic body and tail. No ductal dilation. Note that given the proximity of the necrotic collection there is a risk for ductal interruption and the duct cannot be clearly tracked through the neck of the pancreas to the ampulla. 5. Extrinsic compression upon the common bile duct and signs of tumefactive sludge or stones in the gallbladder lumen. 6. Marked hepatic steatosis. Variable perfusion/enhancement of the liver could indicate developing liver disease or be related to above findings. No suspicious hepatic lesion. Trace ascites. 7. Mild splenomegaly.   Patient is evaluated by vascular surgery, Dr. Lucky Cowboy who did not recommend active vascular intervention at this time.  However, if the patient deteriorates from hemorrhage from any of these major blood vessels, it would be extremely difficult to embolize, successfully due to the large number of blood vessels feeding the area, the very large size of the blood vessels, and the multiple different vessels that would have to be embolized.  When I interviewed the patient, she reports that her abdominal pain is better, she is started on IV fluids at 150 mill per hour, kept n.p.o.  She denied any nausea, vomiting.  She has been afebrile and has been hemodynamically stable  NSAIDs: Was taking Advil, Aleve for restless legs  Antiplts/Anticoagulants/Anti thrombotics: None  GI Procedures:  EGD and colonoscopy 03/15/2021 - LA Grade B reflux esophagitis with no bleeding. - Erythematous mucosa in the antrum.  Biopsied. - Small hiatal hernia. - Normal duodenal bulb, second portion of the duodenum and examined duodenum. Biopsied. - Biopsies were obtained in the gastric body, at the incisura and in the gastric antrum.  - The entire examined colon is normal. - No specimens collected.  DIAGNOSIS:  A. DUODENUM; COLD BIOPSY:  - DUODENAL MUCOSA WITH NO SIGNIFICANT PATHOLOGIC ALTERATION.  - NEGATIVE FOR FEATURES OF CELIAC DISEASE.  - NEGATIVE FOR DYSPLASIA AND MALIGNANCY.   B. STOMACH; COLD BIOPSY:  - CHRONIC ACTIVE HELICOBACTER PYLORI GASTRITIS.  - NEGATIVE FOR INTESTINAL METAPLASIA, DYSPLASIA, AND MALIGNANCY.   Past Medical History:  Diagnosis Date  . Anemia   . Hypertension     Past Surgical History:  Procedure Laterality Date  . COLONOSCOPY N/A 03/15/2021   Procedure: COLONOSCOPY;  Surgeon: Virgel Manifold, MD;  Location: [facility] ENDOSCOPY;  Service: Endoscopy;  Laterality: N/A;  . ESOPHAGOGASTRODUODENOSCOPY N/A 03/15/2021   Procedure: ESOPHAGOGASTRODUODENOSCOPY (EGD);  Surgeon: Virgel Manifold, MD;  Location: [facility] ENDOSCOPY;  Service: Endoscopy;  Laterality: N/A;  . MANDIBLE FRACTURE SURGERY      Prior to Admission medications   Medication Sig Start Date End Date Taking? Authorizing Provider  amoxicillin (AMOXIL) 500 MG tablet Take 2 tablets (1,000 mg total) by mouth 2 (two) times daily for 14 days. 03/18/21 04/01/21 Yes Virgel Manifold, MD  clarithromycin (BIAXIN) 500 MG tablet Take 1 tablet (500 mg total) by mouth 2 (two) times daily for 14 days. 03/18/21 04/01/21 Yes Tahiliani, Margretta Sidle B, MD  ferrous sulfate 325 (65 FE) MG tablet Take 1 tablet (325 mg total) by mouth daily. 03/15/21 04/14/21 Yes Sharen Hones, MD  pantoprazole (PROTONIX) 20 MG tablet Take 1 tablet (20 mg total) by mouth in the morning and at bedtime for 14 days. 03/18/21 04/01/21 Yes Virgel Manifold, MD  vitamin B-12 (CYANOCOBALAMIN) 1000 MCG tablet Take 1 tablet (1,000 mcg total) by mouth daily. 03/15/21  Yes  Sharen Hones, MD  folic acid (FOLVITE) 1 MG tablet Take 1 tablet (1 mg total) by mouth daily. Patient not taking: No sig reported 02/18/19   Triplett, Johnette Abraham B, FNP  gabapentin (NEURONTIN) 100 MG capsule Take 1 capsule (100 mg total) by mouth 3 (three) times daily as needed. 02/18/19 05/19/19  Triplett, Johnette Abraham B, FNP  ondansetron (ZOFRAN) 4 MG tablet Take 1 tablet (4 mg total) by mouth every 8 (eight) hours as needed for nausea or vomiting. Patient not taking: No sig reported 02/18/19   Sherrie George B, FNP  oxyCODONE-acetaminophen (PERCOCET/ROXICET) 5-325 MG tablet Take 1 tablet by mouth every 4 (four) hours as needed for severe pain. Patient not taking: No sig reported 03/18/20   Paulette Blanch, MD  promethazine (PHENERGAN) 25 MG tablet Take 1 tablet (25 mg total) by mouth every 6 (six) hours as needed for nausea or vomiting. Patient not taking: No sig reported 03/06/19   Harvest Dark, MD  traMADol (ULTRAM) 50 MG tablet Take 1 tablet (50 mg total) by mouth every 6 (six) hours as needed. Patient not taking: No sig reported 02/18/19   Sherrie George B, FNP    Current Facility-Administered Medications:  .  albuterol (VENTOLIN HFA) 108 (90 Base) MCG/ACT inhaler 2 puff, 2 puff, Inhalation, Q4H PRN, Ivor Costa,  MD .  amoxicillin (AMOXIL) capsule 1,000 mg, 1,000 mg, Oral, BID, Ivor Costa, MD .  clarithromycin (BIAXIN) tablet 500 mg, 500 mg, Oral, BID, Ivor Costa, MD .  dextromethorphan-guaiFENesin [facility] DM) 30-600 MG per 12 hr tablet 1 tablet, 1 tablet, Oral, Q12H PRN, Ivor Costa, MD .  ferrous sulfate tablet 325 mg, 325 mg, Oral, Daily, Ivor Costa, MD .  gabapentin (NEURONTIN) capsule 100 mg, 100 mg, Oral, TID PRN, Ivor Costa, MD .  hydrALAZINE (APRESOLINE) injection 5 mg, 5 mg, Intravenous, Q2H PRN, Ivor Costa, MD .  HYDROmorphone (DILAUDID) injection 1 mg, 1 mg, Intravenous, Q3H PRN, Ivor Costa, MD, 1 mg at 03/29/21 1419 .  lactated ringers infusion, , Intravenous, Continuous, Ivor Costa, MD, Last  Rate: 150 mL/hr at 03/29/21 1428, New Bag at 03/29/21 1428 .  nicotine (NICODERM CQ - dosed in mg/24 hours) patch 21 mg, 21 mg, Transdermal, Daily, Ivor Costa, MD .  ondansetron [facility]) injection 4 mg, 4 mg, Intravenous, Q8H PRN, Ivor Costa, MD, 4 mg at 03/29/21 1415 .  oxyCODONE (Oxy IR/ROXICODONE) immediate release tablet 5 mg, 5 mg, Oral, Q6H PRN, Ivor Costa, MD .  pantoprazole (PROTONIX) EC tablet 20 mg, 20 mg, Oral, BID, Ivor Costa, MD .  vitamin B-12 (CYANOCOBALAMIN) tablet 1,000 mcg, 1,000 mcg, Oral, Daily, Ivor Costa, MD  Family History  Problem Relation Age of Onset  . Hepatitis C Mother   . Heart disease Mother      Social History   Tobacco Use  . Smoking status: Current Every Day Smoker    Packs/day: 1.00    Types: Cigarettes  . Smokeless tobacco: Never Used  Vaping Use  . Vaping Use: Some days  Substance Use Topics  . Alcohol use: No    Comment: rarely   . Drug use: No    Allergies as of 03/28/2021  . (No Known Allergies)    Review of Systems:    All systems reviewed and negative except where noted in HPI.   Physical Exam:  Vital signs in last 24 hours: Temp:  [97.6 F (36.4 C)-98.3 F (36.8 C)] 98.3 F (36.8 C) (06/03 1322) Pulse Rate:  [76-101] 89 (06/03 1322) Resp:  [12-18] 17 (06/03 1251) BP: (113-152)/(82-98) 142/95 (06/03 1322) SpO2:  [97 %-100 %] 100 % (06/03 1322) Weight:  [56.7 kg] 56.7 kg (06/03 0006) Last BM Date: 03/28/21 General:   Pleasant, cooperative in NAD Head:  Normocephalic and atraumatic. Eyes:   No icterus.   Conjunctiva pink. PERRLA. Ears:  Normal auditory acuity. Neck:  Supple; no masses or thyroidomegaly Lungs: Respirations even and unlabored. Lungs clear to auscultation bilaterally.   No wheezes, crackles, or rhonchi.  Heart:  Regular rate and rhythm;  Without murmur, clicks, rubs or gallops Abdomen:  Soft, moderately distended, tender in the epigastric area. Normal bowel sounds. No appreciable masses or hepatomegaly.  No  rebound or guarding.  Rectal:  Not performed. Msk:  Symmetrical without gross deformities.  Strength generalized weakness Extremities:  Without edema, cyanosis or clubbing. Neurologic:  Alert and oriented x3;  grossly normal neurologically. Skin:  Intact without significant lesions or rashes. Psych:  Alert and cooperative. Normal affect.  LAB RESULTS: CBC Latest Ref Rng & Units 03/29/2021 03/15/2021 03/14/2021  WBC 4.0 - 10.5 K/uL 8.0 4.6 3.9(L)  Hemoglobin 12.0 - 15.0 g/dL 13.6 10.0(L) 8.7(L)  Hematocrit 36.0 - 46.0 % 44.6 34.6(L) 30.6(L)  Platelets 150 - 400 K/uL 307 258 192    BMET BMP Latest Ref Rng & Units  03/29/2021 03/15/2021 03/14/2021  Glucose 70 - 99 mg/dL 184(H) 91 114(H)  BUN 6 - 20 mg/dL <5(L) <5(L) 5(L)  Creatinine 0.44 - 1.00 mg/dL 0.52 0.44 0.50  Sodium 135 - 145 mmol/L 138 139 137  Potassium 3.5 - 5.1 mmol/L 3.8 4.1 4.8  Chloride 98 - 111 mmol/L 106 111 108  CO2 22 - 32 mmol/L 20(L) 23 24  Calcium 8.9 - 10.3 mg/dL 9.4 8.7(L) 8.6(L)    LFT Hepatic Function Latest Ref Rng & Units 03/29/2021 03/12/2021 03/17/2020  Total Protein 6.5 - 8.1 g/dL 7.3 6.6 6.8  Albumin 3.5 - 5.0 g/dL 3.3(L) 2.7(L) 3.4(L)  AST 15 - 41 U/L 67(H) 51(H) 129(H)  ALT 0 - 44 U/L 43 33 66(H)  Alk Phosphatase 38 - 126 U/L 161(H) 148(H) 139(H)  Total Bilirubin 0.3 - 1.2 mg/dL 1.2 0.6 1.0  Bilirubin, Direct 0.0 - 0.2 mg/dL 0.3(H) - -     STUDIES: DG Chest 2 View  Result Date: 03/29/2021 CLINICAL DATA:  Chest pain EXAM: CHEST - 2 VIEW COMPARISON:  None. FINDINGS: The heart size and mediastinal contours are within normal limits. Both lungs are clear. The visualized skeletal structures are unremarkable. IMPRESSION: No active cardiopulmonary disease. Electronically Signed   By: Ulyses Jarred M.D.   On: 03/29/2021 01:12   MR ABDOMEN W WO CONTRAST  Addendum Date: 03/29/2021   ADDENDUM REPORT: 03/29/2021 12:36 ADDENDUM: These results were called by telephone at the time of interpretation on 03/29/2021 at 12:36  pm to provider Ivor Costa , who verbally acknowledged these results. Electronically Signed   By: Zetta Bills M.D.   On: 03/29/2021 12:36   Result Date: 03/29/2021 CLINICAL DATA:  35 year old female with abdominal pain and pancreatitis, history of masslike area in the head of the pancreas EXAM: MRI ABDOMEN WITHOUT AND WITH CONTRAST TECHNIQUE: Multiplanar multisequence MR imaging of the abdomen was performed both before and after the administration of intravenous contrast. CONTRAST:  70m GADAVIST GADOBUTROL 1 MMOL/ML IV SOLN COMPARISON:  CT evaluation of March 29, 2021. FINDINGS: Lower chest: Lung bases without effusion or sign of consolidative change. Hepatobiliary: Hepatic steatosis with mildly heterogeneous perfusion of the liver. Cyst in the RIGHT hepatic lobe. No focal, suspicious hepatic lesion. The portal vein remains patent despite signs of pancreatitis. SMV is patent. Splenic vein is patent. Biliary tree with caliber change at the site of mass effect from the pancreatic lesion. Mild distension of the common bile duct up to 5 mm at this level. No signs of filling defect. Cholelithiasis or tumefactive sludge in the gallbladder. Pancreas: In the pancreatic head is a hemorrhagic collection without internal enhancement that measures 5.7 x 5.2 x 5.7 cm. No visible pancreatic tissue is noted in the head or uncinate. The neck and tail of the pancreas shows normal intrinsic T1 signal. There is no ductal dilation of the dorsal pancreatic duct. There is extensive interstitial edematous change throughout the peripheral remaining pancreas and signs of peripancreatic stranding tracking into the root of the mesentery and the transverse mesocolon. Spleen: Spleen without focal, suspicious lesion. 13 cm greatest craniocaudal dimension. Adrenals/Urinary Tract: Adrenal glands are normal. No perinephric stranding. No hydronephrosis. Smooth renal contours. No suspicious renal lesion. Stomach/Bowel: No acute gastrointestinal  process aside from presumed secondary bowel edema in the setting of severe acute interstitial edematous pancreatitis and signs of necrotic pancreatitis as above. Vascular/Lymphatic: Hypertrophied GDA pathways bridging inferior pancreaticoduodenal collaterals to the SMA in the absence of stenosis of the SMA. There is some narrowing of  the celiac that is suggested on prior CT imaging likely due to median arcuate ligament impression, this is likely a collateral network as result of this abnormality. Patent portal vein, splenic vein and SMV. No adenopathy Other: Trace ascites. Musculoskeletal: No suspicious bone lesions identified. IMPRESSION: 1. Hemorrhagic area of walled off necrosis is large and replaces head and uncinate process of the pancreas and is intimately associated with hypertrophied collateral pathways between the SMA and celiac. 2. Celiac flow may be dependent upon this pathway and this collateral network could be at risk of bleeding given proximity to the large area of walled off necrosis. Close attention on follow-up is suggested. 3. Given that the collateral pathway may supply of much of the celiac flow, this is of importance should there be a need for future intervention. 4. Severe acute interstitial edematous pancreatitis involving the pancreatic body and tail. No ductal dilation. Note that given the proximity of the necrotic collection there is a risk for ductal interruption and the duct cannot be clearly tracked through the neck of the pancreas to the ampulla. 5. Extrinsic compression upon the common bile duct and signs of tumefactive sludge or stones in the gallbladder lumen. 6. Marked hepatic steatosis. Variable perfusion/enhancement of the liver could indicate developing liver disease or be related to above findings. No suspicious hepatic lesion. Trace ascites. 7. Mild splenomegaly. A call is out to the referring provider to further discuss findings in the above case. Electronically Signed: By:  Zetta Bills M.D. On: 03/29/2021 12:29   CT ABDOMEN PELVIS W CONTRAST  Result Date: 03/29/2021 CLINICAL DATA:  35 year old female with history of acute onset of nonlocalized abdominal pain. EXAM: CT ABDOMEN AND PELVIS WITH CONTRAST TECHNIQUE: Multidetector CT imaging of the abdomen and pelvis was performed using the standard protocol following bolus administration of intravenous contrast. CONTRAST:  73m OMNIPAQUE IOHEXOL 300 MG/ML  SOLN COMPARISON:  CT of the abdomen and pelvis 11/23/2018. FINDINGS: Lower chest: Small amount of subsegmental atelectasis in the right lower lobe. Hepatobiliary: Liver is enlarged measuring 24.8 cm in craniocaudal span. Heterogeneous areas of low attenuation are noted throughout the hepatic parenchyma, indicative of heterogeneous hepatic steatosis. No discrete cystic or solid hepatic lesions. No intra or extrahepatic biliary ductal dilatation. Gallbladder is nearly completely decompressed, but appears to contain several noncalcified gallstones. Pancreas: In the head of the pancreas (axial image 39 of series 2 and coronal image 30 of series 5) there is a large 5.4 x 4.8 x 5.6 cm rounded lesion of heterogeneous internal attenuation ranging from low to high attenuation. This exerts mass effect upon adjacent structures. Ectasia of the main pancreatic duct throughout the body and tail of the pancreas. Inflammatory changes are noted surrounding the entire pancreas extending into the adjacent retroperitoneal fat. Spleen: Spleen is enlarged measuring 13.3 x 5.6 x 14.4 cm (estimated splenic volume of 536 mL) . Adrenals/Urinary Tract: Bilateral kidneys and adrenal glands are normal in appearance. No hydroureteronephrosis. Urinary bladder is normal in appearance. Stomach/Bowel: Appearance of the stomach is normal. There is no pathologic dilatation of small bowel or colon. Normal appendix. Vascular/Lymphatic: No significant atherosclerotic disease, aneurysm or dissection noted in the abdominal  or pelvic vasculature. Superior mesenteric vein, splenic vein and portal vein are all patent. Prominent blood vessels surrounding the large lesion in the head of the pancreas, most notably a very prominent pancreaticoduodenal artery arcade. No lymphadenopathy noted in the abdomen or pelvis. Reproductive: Uterus and ovaries are unremarkable in appearance. Other: No significant volume of ascites.  No pneumoperitoneum. Musculoskeletal: There are no aggressive appearing lytic or blastic lesions noted in the visualized portions of the skeleton. IMPRESSION: 1. Extensive inflammation surrounding the pancreas, indicative of acute pancreatitis. Associated with this there is a large lesion in the head of the pancreas which is of mixed attenuation. The appearance of this is favored to represent a pseudocyst, although the possibility of internal hemorrhage within the pseudocyst is suspected, particularly given the markedly dilated vessels in the surrounding pancreaticoduodenal arcade. Alternatively, a hemorrhagic mass is difficult to exclude, but is not strongly favored given the patient's young age. This could be further characterized with abdominal MRI with and without IV gadolinium with MRCP. 2. Cholelithiasis without evidence of acute cholecystitis at this time. 3. Hepatomegaly with severe hepatic steatosis. 4. Splenomegaly. Electronically Signed   By: Vinnie Langton M.D.   On: 03/29/2021 06:47   MR 3D Recon At Scanner  Addendum Date: 03/29/2021   ADDENDUM REPORT: 03/29/2021 12:36 ADDENDUM: These results were called by telephone at the time of interpretation on 03/29/2021 at 12:36 pm to provider Ivor Costa , who verbally acknowledged these results. Electronically Signed   By: Zetta Bills M.D.   On: 03/29/2021 12:36   Result Date: 03/29/2021 CLINICAL DATA:  35 year old female with abdominal pain and pancreatitis, history of masslike area in the head of the pancreas EXAM: MRI ABDOMEN WITHOUT AND WITH CONTRAST TECHNIQUE:  Multiplanar multisequence MR imaging of the abdomen was performed both before and after the administration of intravenous contrast. CONTRAST:  60m GADAVIST GADOBUTROL 1 MMOL/ML IV SOLN COMPARISON:  CT evaluation of March 29, 2021. FINDINGS: Lower chest: Lung bases without effusion or sign of consolidative change. Hepatobiliary: Hepatic steatosis with mildly heterogeneous perfusion of the liver. Cyst in the RIGHT hepatic lobe. No focal, suspicious hepatic lesion. The portal vein remains patent despite signs of pancreatitis. SMV is patent. Splenic vein is patent. Biliary tree with caliber change at the site of mass effect from the pancreatic lesion. Mild distension of the common bile duct up to 5 mm at this level. No signs of filling defect. Cholelithiasis or tumefactive sludge in the gallbladder. Pancreas: In the pancreatic head is a hemorrhagic collection without internal enhancement that measures 5.7 x 5.2 x 5.7 cm. No visible pancreatic tissue is noted in the head or uncinate. The neck and tail of the pancreas shows normal intrinsic T1 signal. There is no ductal dilation of the dorsal pancreatic duct. There is extensive interstitial edematous change throughout the peripheral remaining pancreas and signs of peripancreatic stranding tracking into the root of the mesentery and the transverse mesocolon. Spleen: Spleen without focal, suspicious lesion. 13 cm greatest craniocaudal dimension. Adrenals/Urinary Tract: Adrenal glands are normal. No perinephric stranding. No hydronephrosis. Smooth renal contours. No suspicious renal lesion. Stomach/Bowel: No acute gastrointestinal process aside from presumed secondary bowel edema in the setting of severe acute interstitial edematous pancreatitis and signs of necrotic pancreatitis as above. Vascular/Lymphatic: Hypertrophied GDA pathways bridging inferior pancreaticoduodenal collaterals to the SMA in the absence of stenosis of the SMA. There is some narrowing of the celiac that  is suggested on prior CT imaging likely due to median arcuate ligament impression, this is likely a collateral network as result of this abnormality. Patent portal vein, splenic vein and SMV. No adenopathy Other: Trace ascites. Musculoskeletal: No suspicious bone lesions identified. IMPRESSION: 1. Hemorrhagic area of walled off necrosis is large and replaces head and uncinate process of the pancreas and is intimately associated with hypertrophied collateral pathways between the SMA  and celiac. 2. Celiac flow may be dependent upon this pathway and this collateral network could be at risk of bleeding given proximity to the large area of walled off necrosis. Close attention on follow-up is suggested. 3. Given that the collateral pathway may supply of much of the celiac flow, this is of importance should there be a need for future intervention. 4. Severe acute interstitial edematous pancreatitis involving the pancreatic body and tail. No ductal dilation. Note that given the proximity of the necrotic collection there is a risk for ductal interruption and the duct cannot be clearly tracked through the neck of the pancreas to the ampulla. 5. Extrinsic compression upon the common bile duct and signs of tumefactive sludge or stones in the gallbladder lumen. 6. Marked hepatic steatosis. Variable perfusion/enhancement of the liver could indicate developing liver disease or be related to above findings. No suspicious hepatic lesion. Trace ascites. 7. Mild splenomegaly. A call is out to the referring provider to further discuss findings in the above case. Electronically Signed: By: Zetta Bills M.D. On: 03/29/2021 12:29      Impression / Plan:   Sandra Pearson is a 35 y.o. female with history of iron deficiency anemia, H. pylori infection, history of alcohol use, alcohol induced acute pancreatitis in 2020 who presented with severe acute pancreatitis with walled off necrosis and hemorrhage and hypertrophic collaterals  within the celiac and superior mesenteric artery  Acute severe pancreatitis with walled off necrosis: No evidence of infection at this time, no evidence of biliary obstruction at this time N.p.o. except meds Continue IV fluids 150 mL/h Patient will need to follow-up with advanced endoscopist for endoscopic drainage of walled off necrosis Pain control, antiemetics as needed  Hypertrophic collaterals between the celiac and superior mesenteric artery: Appreciate vascular surgery recommendations.  No indication for any type of intervention at this time as bleeding from this location would be catastrophic.  Very limited ability to embolize the area.  In anticipation of any major bleeding into the pancreas, I highly recommend patient to be transferred to tertiary care center as patient is extremely high risk  Complete abstinence from alcohol use, avoid NSAID use  History of iron deficiency anemia and H. pylori infection Patient can repeat the course of antibiotics for H. pylori after acute issues resolve  Thank you for involving me in the care of this patient.  Dr. Bonna Gains will cover from tomorrow if patient is not transferred to [facility]    LOS: 0 days   Sherri Sear, MD  03/29/2021, 3:58 PM   Note: This dictation was prepared with Dragon dictation along with smaller phrase technology. Any transcriptional errors that result from this process are unintentional.

## 2021-03-29 NOTE — Consult Note (Signed)
[facility] Vascular Consult Note  MRN : [id number]  Sandra Pearson is a 35 y.o. (Apr 04, 1986) female who presents with chief complaint of  Chief Complaint  Patient presents with  . Chest Pain   History of Present Illness:  Sandra Pearson is a 35 y.o. female with medical history significant of GERD with esophagitis, hiatal hernia, tobacco abuse, complex ovarian cyst, anemia, H. pylori infection, who presents with abdominal pain.  Patient was recently hospitalized from 5/17-5/20 due to severe anemia, which is likely due to malabsorption.  Patient has iron and vitamin B12 deficiency.  EGD showed GERD with esophagitis. Patient was given IV iron and B12 shot.   Patient states that her abdominal pain started yesterday, which has been progressively worsening, much worse since last night.  The abdominal pain is diffuse, sharp, severe, radiating to the back.  Associated with nausea and vomiting, more than 10 times of nonbloody vomiting.  No diarrhea.  Denies fever or chills.  Patient also reports mild shortness of breath, no cough.  She states that she has lower chest pain which seems to be radiated from epigastric abdominal pain.  No symptoms of UTI.  Patient states that she drinks 1 sometimes, but denies heavily drinking. Patient is currently taking amoxicillin and clarithromycin for H. pylori infection (5/23 - 6/6).  ED Course: pt was found to have lipase 390, troponin level 4 --> 4, negative pregnancy test, pending COVID-19 PCR, renal function okay, liver function (ALP 161, AST 67, ALT 43, total bilirubin 1.2, direct bilirubin 0.3), magnesium 1.5, potassium 3.8.  Patient is placed on MedSurg bed for observation.  Vascular surgery was consulted Dr. Clyde Lundborg in the setting of severe acute pancreatitis with large collaterals noted to the mesentery.  Current Facility-Administered Medications  Medication Dose Route Frequency Provider Last Rate Last Admin  . albuterol (VENTOLIN  HFA) 108 (90 Base) MCG/ACT inhaler 2 puff  2 puff Inhalation Q4H PRN Lorretta Harp, MD      . amoxicillin (AMOXIL) capsule 1,000 mg  1,000 mg Oral BID Lorretta Harp, MD      . clarithromycin [place]) tablet 500 mg  500 mg Oral BID Lorretta Harp, MD      . dextromethorphan-guaiFENesin [facility] DM) 30-600 MG per 12 hr tablet 1 tablet  1 tablet Oral Q12H PRN Lorretta Harp, MD      . ferrous sulfate tablet 325 mg  325 mg Oral Daily Lorretta Harp, MD      . gabapentin (NEURONTIN) capsule 100 mg  100 mg Oral TID PRN Lorretta Harp, MD      . hydrALAZINE (APRESOLINE) injection 5 mg  5 mg Intravenous Q2H PRN Lorretta Harp, MD      . HYDROmorphone (DILAUDID) injection 1 mg  1 mg Intravenous Q3H PRN Lorretta Harp, MD   1 mg at 03/29/21 1201  . lactated ringers infusion   Intravenous Continuous Lorretta Harp, MD 150 mL/hr at 03/29/21 0804 Infusion Verify at 03/29/21 0804  . nicotine (NICODERM CQ - dosed in mg/24 hours) patch 21 mg  21 mg Transdermal Daily Lorretta Harp, MD      . ondansetron [facility]) injection 4 mg  4 mg Intravenous Q8H PRN Lorretta Harp, MD      . oxyCODONE (Oxy IR/ROXICODONE) immediate release tablet 5 mg  5 mg Oral Q6H PRN Lorretta Harp, MD      . pantoprazole (PROTONIX) EC tablet 20 mg  20 mg Oral BID Lorretta Harp, MD      . vitamin B-12 (CYANOCOBALAMIN)  tablet 1,000 mcg  1,000 mcg Oral Daily Lorretta Harp, MD       Current Outpatient Medications  Medication Sig Dispense Refill  . amoxicillin (AMOXIL) 500 MG tablet Take 2 tablets (1,000 mg total) by mouth 2 (two) times daily for 14 days. 56 tablet 0  . clarithromycin (BIAXIN) 500 MG tablet Take 1 tablet (500 mg total) by mouth 2 (two) times daily for 14 days. 28 tablet 0  . ferrous sulfate 325 (65 FE) MG tablet Take 1 tablet (325 mg total) by mouth daily. 30 tablet 0  . pantoprazole (PROTONIX) 20 MG tablet Take 1 tablet (20 mg total) by mouth in the morning and at bedtime for 14 days. 28 tablet 0  . vitamin B-12 (CYANOCOBALAMIN) 1000 MCG tablet Take 1 tablet (1,000 mcg total)  by mouth daily. 30 tablet 0  . folic acid (FOLVITE) 1 MG tablet Take 1 tablet (1 mg total) by mouth daily. (Patient not taking: No sig reported) 30 tablet 2  . gabapentin (NEURONTIN) 100 MG capsule Take 1 capsule (100 mg total) by mouth 3 (three) times daily as needed. 30 capsule 3  . ondansetron (ZOFRAN) 4 MG tablet Take 1 tablet (4 mg total) by mouth every 8 (eight) hours as needed for nausea or vomiting. (Patient not taking: No sig reported) 20 tablet 1  . oxyCODONE-acetaminophen (PERCOCET/ROXICET) 5-325 MG tablet Take 1 tablet by mouth every 4 (four) hours as needed for severe pain. (Patient not taking: No sig reported) 15 tablet 0  . promethazine (PHENERGAN) 25 MG tablet Take 1 tablet (25 mg total) by mouth every 6 (six) hours as needed for nausea or vomiting. (Patient not taking: No sig reported) 20 tablet 0  . traMADol (ULTRAM) 50 MG tablet Take 1 tablet (50 mg total) by mouth every 6 (six) hours as needed. (Patient not taking: No sig reported) 20 tablet 0   Past Medical History:  Diagnosis Date  . Anemia   . Hypertension    Past Surgical History:  Procedure Laterality Date  . COLONOSCOPY N/A 03/15/2021   Procedure: COLONOSCOPY;  Surgeon: Pasty Spillers, MD;  Location[facility] ENDOSCOPY;  Service: Endoscopy;  Laterality: N/A;  . ESOPHAGOGASTRODUODENOSCOPY N/A 03/15/2021   Procedure: ESOPHAGOGASTRODUODENOSCOPY (EGD);  Surgeon: Pasty Spillers, MD;  Location: [facility] ENDOSCOPY;  Service: Endoscopy;  Laterality: N/A;  . MANDIBLE FRACTURE SURGERY     Social History Social History   Tobacco Use  . Smoking status: Current Every Day Smoker    Packs/day: 1.00    Types: Cigarettes  . Smokeless tobacco: Never Used  Vaping Use  . Vaping Use: Some days  Substance Use Topics  . Alcohol use: No    Comment: rarely   . Drug use: No   Family History Family History  Problem Relation Age of Onset  . Hepatitis C Mother   . Heart disease Mother   Denies family history of peripheral  artery disease, venous disease or bleeding / clotting disorders.  No Known Allergies  REVIEW OF SYSTEMS (Negative unless checked)  Constitutional: Weight loss  Fever  Chills Cardiac: Chest pain   Chest pressure   Palpitations   Shortness of breath when laying flat   Shortness of breath at rest   Shortness of breath with exertion. Vascular:  Pain in legs with walking   Pain in legs at rest   Pain in legs when laying flat   Claudication   Pain in feet when walking  Pain in feet at rest  Pain in  feet when laying flat   [] History of DVT   [] Phlebitis   [] Swelling in legs   [] Varicose veins   [] Non-healing ulcers Pulmonary:   [] Uses home oxygen   [] Productive cough   [] Hemoptysis   [] Wheeze  [] COPD   [] Asthma Neurologic:  [] Dizziness  [] Blackouts   [] Seizures   [] History of stroke   [] History of TIA  [] Aphasia   [] Temporary blindness   [] Dysphagia   [] Weakness or numbness in arms   [] Weakness or numbness in legs Musculoskeletal:  [] Arthritis   [] Joint swelling   [] Joint pain   [] Low back pain Hematologic:  [] Easy bruising  [] Easy bleeding   [] Hypercoagulable state   [x] Anemic  [] Hepatitis Gastrointestinal:  [] Blood in stool   [] Vomiting blood  [] Gastroesophageal reflux/heartburn   [] Difficulty swallowing. Genitourinary:  [] Chronic kidney disease   [] Difficult urination  [] Frequent urination  [] Burning with urination   [] Blood in urine Skin:  [] Rashes   [] Ulcers   [] Wounds Psychological:  [] History of anxiety   []  History of major depression.  Positive for abdominal pain and nausea  Physical Examination  Vitals:   03/29/21 1217 03/29/21 1245 03/29/21 1251 03/29/21 1322  BP:   (!) 146/96 (!) 142/95  Pulse: 80 84 91 89  Resp: 12 12 17    Temp:    98.3 F (36.8 C)  TempSrc:    Oral  SpO2: 97% 97% 99% 100%  Weight:      Height:       Body mass index is 18.46 kg/m. Gen:  WD/WN, NAD Head: [place]/AT, No temporalis wasting. Prominent temp pulse not  noted. Ear/Nose/Throat: Hearing grossly intact, nares w/o erythema or drainage, oropharynx w/o Erythema/Exudate Eyes: Sclera non-icteric, conjunctiva clear Neck: Trachea midline.  No JVD.  Pulmonary:  Good air movement, respirations not labored, equal bilaterally.  Cardiac: RRR, normal S1, S2. Vascular:  Vessel Right Left  Radial Palpable Palpable  Ulnar Palpable Palpable                               Gastrointestinal: Soft. Distended. Tender to palpation. Decreased bowel sounds.   Musculoskeletal: M/S 5/5 throughout. Extremities without ischemic changes.  No deformity or atrophy. No edema. Neurologic: Sensation grossly intact in extremities.  Symmetrical.  Speech is fluent. Motor exam as listed above. Psychiatric: Judgment intact, Mood & affect appropriate for pt's clinical situation. Dermatologic: No rashes or ulcers noted.  No cellulitis or open wounds. Lymph : No Cervical, Axillary, or Inguinal lymphadenopathy.  CBC Lab Results  Component Value Date   WBC 8.0 03/29/2021   HGB 13.6 03/29/2021   HCT 44.6 03/29/2021   MCV 85.9 03/29/2021   PLT 307 03/29/2021   BMET    Component Value Date/Time   NA 138 03/29/2021 0023   K 3.8 03/29/2021 0023   CL 106 03/29/2021 0023   CO2 20 (L) 03/29/2021 0023   GLUCOSE 184 (H) 03/29/2021 0023   BUN <5 (L) 03/29/2021 0023   CREATININE 0.52 03/29/2021 0023   CALCIUM 9.4 03/29/2021 0023   GFRNONAA >60 03/29/2021 0023   GFRAA >60 03/17/2020 2215   Estimated Creatinine Clearance: 89.5 mL/min (by C-G formula based on SCr of 0.52 mg/dL).  COAG Lab Results  Component Value Date   INR 1.1 03/12/2021   Radiology DG Chest 2 View  Result Date: 03/29/2021 CLINICAL DATA:  Chest pain EXAM: CHEST - 2 VIEW COMPARISON:  None. FINDINGS: The heart size and mediastinal contours are within normal  limits. Both lungs are clear. The visualized skeletal structures are unremarkable. IMPRESSION: No active cardiopulmonary disease. Electronically Signed    By: Deatra Robinson M.D.   On: 03/29/2021 01:12   MR ABDOMEN W WO CONTRAST  Addendum Date: 03/29/2021   ADDENDUM REPORT: 03/29/2021 12:36 ADDENDUM: These results were called by telephone at the time of interpretation on 03/29/2021 at 12:36 pm to provider Lorretta Harp , who verbally acknowledged these results. Electronically Signed   By: Donzetta Kohut M.D.   On: 03/29/2021 12:36   Result Date: 03/29/2021 CLINICAL DATA:  34 year old female with abdominal pain and pancreatitis, history of masslike area in the head of the pancreas EXAM: MRI ABDOMEN WITHOUT AND WITH CONTRAST TECHNIQUE: Multiplanar multisequence MR imaging of the abdomen was performed both before and after the administration of intravenous contrast. CONTRAST:  5mL GADAVIST GADOBUTROL 1 MMOL/ML IV SOLN COMPARISON:  CT evaluation of March 29, 2021. FINDINGS: Lower chest: Lung bases without effusion or sign of consolidative change. Hepatobiliary: Hepatic steatosis with mildly heterogeneous perfusion of the liver. Cyst in the RIGHT hepatic lobe. No focal, suspicious hepatic lesion. The portal vein remains patent despite signs of pancreatitis. SMV is patent. Splenic vein is patent. Biliary tree with caliber change at the site of mass effect from the pancreatic lesion. Mild distension of the common bile duct up to 5 mm at this level. No signs of filling defect. Cholelithiasis or tumefactive sludge in the gallbladder. Pancreas: In the pancreatic head is a hemorrhagic collection without internal enhancement that measures 5.7 x 5.2 x 5.7 cm. No visible pancreatic tissue is noted in the head or uncinate. The neck and tail of the pancreas shows normal intrinsic T1 signal. There is no ductal dilation of the dorsal pancreatic duct. There is extensive interstitial edematous change throughout the peripheral remaining pancreas and signs of peripancreatic stranding tracking into the root of the mesentery and the transverse mesocolon. Spleen: Spleen without focal, suspicious  lesion. 13 cm greatest craniocaudal dimension. Adrenals/Urinary Tract: Adrenal glands are normal. No perinephric stranding. No hydronephrosis. Smooth renal contours. No suspicious renal lesion. Stomach/Bowel: No acute gastrointestinal process aside from presumed secondary bowel edema in the setting of severe acute interstitial edematous pancreatitis and signs of necrotic pancreatitis as above. Vascular/Lymphatic: Hypertrophied GDA pathways bridging inferior pancreaticoduodenal collaterals to the SMA in the absence of stenosis of the SMA. There is some narrowing of the celiac that is suggested on prior CT imaging likely due to median arcuate ligament impression, this is likely a collateral network as result of this abnormality. Patent portal vein, splenic vein and SMV. No adenopathy Other: Trace ascites. Musculoskeletal: No suspicious bone lesions identified. IMPRESSION: 1. Hemorrhagic area of walled off necrosis is large and replaces head and uncinate process of the pancreas and is intimately associated with hypertrophied collateral pathways between the SMA and celiac. 2. Celiac flow may be dependent upon this pathway and this collateral network could be at risk of bleeding given proximity to the large area of walled off necrosis. Close attention on follow-up is suggested. 3. Given that the collateral pathway may supply of much of the celiac flow, this is of importance should there be a need for future intervention. 4. Severe acute interstitial edematous pancreatitis involving the pancreatic body and tail. No ductal dilation. Note that given the proximity of the necrotic collection there is a risk for ductal interruption and the duct cannot be clearly tracked through the neck of the pancreas to the ampulla. 5. Extrinsic compression upon the common bile  duct and signs of tumefactive sludge or stones in the gallbladder lumen. 6. Marked hepatic steatosis. Variable perfusion/enhancement of the liver could indicate  developing liver disease or be related to above findings. No suspicious hepatic lesion. Trace ascites. 7. Mild splenomegaly. A call is out to the referring provider to further discuss findings in the above case. Electronically Signed: By: Donzetta KohutGeoffrey  Wile M.D. On: 03/29/2021 12:29   CT ABDOMEN PELVIS W CONTRAST  Result Date: 03/29/2021 CLINICAL DATA:  35 year old female with history of acute onset of nonlocalized abdominal pain. EXAM: CT ABDOMEN AND PELVIS WITH CONTRAST TECHNIQUE: Multidetector CT imaging of the abdomen and pelvis was performed using the standard protocol following bolus administration of intravenous contrast. CONTRAST:  75mL OMNIPAQUE IOHEXOL 300 MG/ML  SOLN COMPARISON:  CT of the abdomen and pelvis 11/23/2018. FINDINGS: Lower chest: Small amount of subsegmental atelectasis in the right lower lobe. Hepatobiliary: Liver is enlarged measuring 24.8 cm in craniocaudal span. Heterogeneous areas of low attenuation are noted throughout the hepatic parenchyma, indicative of heterogeneous hepatic steatosis. No discrete cystic or solid hepatic lesions. No intra or extrahepatic biliary ductal dilatation. Gallbladder is nearly completely decompressed, but appears to contain several noncalcified gallstones. Pancreas: In the head of the pancreas (axial image 39 of series 2 and coronal image 30 of series 5) there is a large 5.4 x 4.8 x 5.6 cm rounded lesion of heterogeneous internal attenuation ranging from low to high attenuation. This exerts mass effect upon adjacent structures. Ectasia of the main pancreatic duct throughout the body and tail of the pancreas. Inflammatory changes are noted surrounding the entire pancreas extending into the adjacent retroperitoneal fat. Spleen: Spleen is enlarged measuring 13.3 x 5.6 x 14.4 cm (estimated splenic volume of 536 mL) . Adrenals/Urinary Tract: Bilateral kidneys and adrenal glands are normal in appearance. No hydroureteronephrosis. Urinary bladder is normal in  appearance. Stomach/Bowel: Appearance of the stomach is normal. There is no pathologic dilatation of small bowel or colon. Normal appendix. Vascular/Lymphatic: No significant atherosclerotic disease, aneurysm or dissection noted in the abdominal or pelvic vasculature. Superior mesenteric vein, splenic vein and portal vein are all patent. Prominent blood vessels surrounding the large lesion in the head of the pancreas, most notably a very prominent pancreaticoduodenal artery arcade. No lymphadenopathy noted in the abdomen or pelvis. Reproductive: Uterus and ovaries are unremarkable in appearance. Other: No significant volume of ascites.  No pneumoperitoneum. Musculoskeletal: There are no aggressive appearing lytic or blastic lesions noted in the visualized portions of the skeleton. IMPRESSION: 1. Extensive inflammation surrounding the pancreas, indicative of acute pancreatitis. Associated with this there is a large lesion in the head of the pancreas which is of mixed attenuation. The appearance of this is favored to represent a pseudocyst, although the possibility of internal hemorrhage within the pseudocyst is suspected, particularly given the markedly dilated vessels in the surrounding pancreaticoduodenal arcade. Alternatively, a hemorrhagic mass is difficult to exclude, but is not strongly favored given the patient's young age. This could be further characterized with abdominal MRI with and without IV gadolinium with MRCP. 2. Cholelithiasis without evidence of acute cholecystitis at this time. 3. Hepatomegaly with severe hepatic steatosis. 4. Splenomegaly. Electronically Signed   By: Trudie Reedaniel  Entrikin M.D.   On: 03/29/2021 06:47   MR 3D Recon At Scanner  Addendum Date: 03/29/2021   ADDENDUM REPORT: 03/29/2021 12:36 ADDENDUM: These results were called by telephone at the time of interpretation on 03/29/2021 at 12:36 pm to provider Lorretta HarpXILIN NIU , who verbally acknowledged these results. Electronically  Signed   By:  Donzetta Kohut M.D.   On: 03/29/2021 12:36   Result Date: 03/29/2021 CLINICAL DATA:  35 year old female with abdominal pain and pancreatitis, history of masslike area in the head of the pancreas EXAM: MRI ABDOMEN WITHOUT AND WITH CONTRAST TECHNIQUE: Multiplanar multisequence MR imaging of the abdomen was performed both before and after the administration of intravenous contrast. CONTRAST:  5mL GADAVIST GADOBUTROL 1 MMOL/ML IV SOLN COMPARISON:  CT evaluation of March 29, 2021. FINDINGS: Lower chest: Lung bases without effusion or sign of consolidative change. Hepatobiliary: Hepatic steatosis with mildly heterogeneous perfusion of the liver. Cyst in the RIGHT hepatic lobe. No focal, suspicious hepatic lesion. The portal vein remains patent despite signs of pancreatitis. SMV is patent. Splenic vein is patent. Biliary tree with caliber change at the site of mass effect from the pancreatic lesion. Mild distension of the common bile duct up to 5 mm at this level. No signs of filling defect. Cholelithiasis or tumefactive sludge in the gallbladder. Pancreas: In the pancreatic head is a hemorrhagic collection without internal enhancement that measures 5.7 x 5.2 x 5.7 cm. No visible pancreatic tissue is noted in the head or uncinate. The neck and tail of the pancreas shows normal intrinsic T1 signal. There is no ductal dilation of the dorsal pancreatic duct. There is extensive interstitial edematous change throughout the peripheral remaining pancreas and signs of peripancreatic stranding tracking into the root of the mesentery and the transverse mesocolon. Spleen: Spleen without focal, suspicious lesion. 13 cm greatest craniocaudal dimension. Adrenals/Urinary Tract: Adrenal glands are normal. No perinephric stranding. No hydronephrosis. Smooth renal contours. No suspicious renal lesion. Stomach/Bowel: No acute gastrointestinal process aside from presumed secondary bowel edema in the setting of severe acute interstitial  edematous pancreatitis and signs of necrotic pancreatitis as above. Vascular/Lymphatic: Hypertrophied GDA pathways bridging inferior pancreaticoduodenal collaterals to the SMA in the absence of stenosis of the SMA. There is some narrowing of the celiac that is suggested on prior CT imaging likely due to median arcuate ligament impression, this is likely a collateral network as result of this abnormality. Patent portal vein, splenic vein and SMV. No adenopathy Other: Trace ascites. Musculoskeletal: No suspicious bone lesions identified. IMPRESSION: 1. Hemorrhagic area of walled off necrosis is large and replaces head and uncinate process of the pancreas and is intimately associated with hypertrophied collateral pathways between the SMA and celiac. 2. Celiac flow may be dependent upon this pathway and this collateral network could be at risk of bleeding given proximity to the large area of walled off necrosis. Close attention on follow-up is suggested. 3. Given that the collateral pathway may supply of much of the celiac flow, this is of importance should there be a need for future intervention. 4. Severe acute interstitial edematous pancreatitis involving the pancreatic body and tail. No ductal dilation. Note that given the proximity of the necrotic collection there is a risk for ductal interruption and the duct cannot be clearly tracked through the neck of the pancreas to the ampulla. 5. Extrinsic compression upon the common bile duct and signs of tumefactive sludge or stones in the gallbladder lumen. 6. Marked hepatic steatosis. Variable perfusion/enhancement of the liver could indicate developing liver disease or be related to above findings. No suspicious hepatic lesion. Trace ascites. 7. Mild splenomegaly. A call is out to the referring provider to further discuss findings in the above case. Electronically Signed: By: Donzetta Kohut M.D. On: 03/29/2021 12:29   US Venous Img Lower Bilateral  Result Date:  03/12/2021 CLINICAL DATA:  35 year old female with bilateral lower extremity pain and swelling over the past 2 weeks. EXAM: BILATERAL LOWER EXTREMITY VENOUS DOPPLER ULTRASOUND TECHNIQUE: Gray-scale sonography with graded compression, as well as color Doppler and duplex ultrasound were performed to evaluate the lower extremity deep venous systems from the level of the common femoral vein and including the common femoral, femoral, profunda femoral, popliteal and calf veins including the posterior tibial, peroneal and gastrocnemius veins when visible. The superficial great saphenous vein was also interrogated. Spectral Doppler was utilized to evaluate flow at rest and with distal augmentation maneuvers in the common femoral, femoral and popliteal veins. COMPARISON:  None. FINDINGS: RIGHT LOWER EXTREMITY Common Femoral Vein: No evidence of thrombus. Normal compressibility, respiratory phasicity and response to augmentation. Saphenofemoral Junction: No evidence of thrombus. Normal compressibility and flow on color Doppler imaging. Profunda Femoral Vein: No evidence of thrombus. Normal compressibility and flow on color Doppler imaging. Femoral Vein: No evidence of thrombus. Normal compressibility, respiratory phasicity and response to augmentation. Popliteal Vein: No evidence of thrombus. Normal compressibility, respiratory phasicity and response to augmentation. Calf Veins: No evidence of thrombus. Normal compressibility and flow on color Doppler imaging. Other Findings:  None. LEFT LOWER EXTREMITY Common Femoral Vein: No evidence of thrombus. Normal compressibility, respiratory phasicity and response to augmentation. Saphenofemoral Junction: No evidence of thrombus. Normal compressibility and flow on color Doppler imaging. Profunda Femoral Vein: No evidence of thrombus. Normal compressibility and flow on color Doppler imaging. Femoral Vein: No evidence of thrombus. Normal compressibility, respiratory phasicity and  response to augmentation. Popliteal Vein: No evidence of thrombus. Normal compressibility, respiratory phasicity and response to augmentation. Calf Veins: No evidence of thrombus. Normal compressibility and flow on color Doppler imaging. Other Findings:  None. IMPRESSION: No evidence of bilateral lower extremity deep venous thrombosis. Marliss Coots, MD [facility] Electronically Signed   By: Marliss Coots MD   On: 03/12/2021 14:16   US PELVIC COMPLETE WITH TRANSVAGINAL  Result Date: 03/13/2021 CLINICAL DATA:  Acute anemia EXAM: TRANSABDOMINAL AND TRANSVAGINAL ULTRASOUND OF PELVIS TECHNIQUE: Both transabdominal and transvaginal ultrasound examinations of the pelvis were performed. Transabdominal technique was performed for global imaging of the pelvis including uterus, ovaries, adnexal regions, and pelvic cul-de-sac. It was necessary to proceed with endovaginal exam following the transabdominal exam to visualize the uterus, endometrium, and ovaries. COMPARISON:  Ultrasound 02/05/2008 FINDINGS: Uterus Measurements: 4.2 x 2.8 x 3.3 cm = volume: 20.4 mL. Retroverted uterus. No concerning uterine mass or discernible fibroid. Endometrium Thickness: 3.3 mm, non thickened.  No focal abnormality visualized. Right ovary Measurements: 3.6 x 2.5 x 2.7 cm = volume: 12.5 mL. Multiple anechoic follicles. Slightly more complex 1.6 cm cyst with reticular echoes suggesting a hemorrhagic cyst. No concerning adnexal mass. Left ovary Measurements: 2.6 x 1.8 x 2.2 cm = volume: 5.4 mL. Multiple anechoic follicles. No concerning adnexal mass. Other findings There is trace free fluid adjacent the right ovary with some low level internal echoes which could reflect rupture of a hemorrhagic cyst. IMPRESSION: Multiple simple appearing follicles seen in both ovaries. A slightly more complex 1.6 cm lobular cyst with internal echogenicity suggestive of a hemorrhagic cyst is seen in the  right adnexa. Only trace free fluid adjacent the right ovary with some low level internal echoes (45/72) can reflect small volume hemorrhage such as from hemorrhagic cyst rupture. Electronically Signed   By: Kreg Shropshire M.D.   On: 03/13/2021 19:16   Assessment/Plan [place]  Jeanice Lim is a 35 year old female with medical history significant of GERD with esophagitis, hiatal hernia, tobacco abuse, complex ovarian cyst, anemia, H. pylori infection, who presents with abdominal pain found to have severe acute pancreatitis  1.  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 Wyn Quaker. Collateral formation most likely due to chronic pancreatitis.  At this time, do not recommend any type of intervention as bleeding from this location would be catastrophic.  Very limited ability to embolize this area.   2.  Possible extrinsic compression on the common bile duct: Recommend general surgery consult  3.  Severe acute pancreatitis: Admitted to the hospitalist service  Discussed with Dr. Weldon Inches, PA-C  03/29/2021 1:51 PM  This note was created with Dragon medical transcription system.  Any error is purely unintentional.

## 2021-03-29 NOTE — ED Notes (Signed)
Report given to Georgie RN 

## 2021-03-29 NOTE — ED Triage Notes (Signed)
Pt reports that she developed chest pain 30 minutes PTA. Pt also states that she is [facility], she is able to speak in complete clear sentences and her O2 is 100%. Equal rise and fall of chest.

## 2021-03-29 NOTE — Discharge Summary (Addendum)
Physician Discharge Summary  [place] ZOX:[id number] DOB: [birth year] DOA: 03/29/2021  PCP: Patient, No Pcp Per (Inactive)  Admit date: 03/29/2021 Discharge date: 03/29/2021  Recommendations for Outpatient Follow-up:  -transfer to [facility] hospital  Home Health: none Equipment/Devices: none  Discharge Condition:  stable CODE STATUS: full Diet recommendation: heart healthy diet  Brief/Interim Summary (HPI)  Sandra Pearson is a 35 y.o. female with medical history significant of GERD with esophagitis, hiatal hernia, tobacco abuse, complex ovarian cyst, anemia, H. pylori infection, who presents with abdominal pain.  Patient was recently hospitalized from 5/17-5/20 due to severe anemia, which is likely due to malabsorption.  Patient has iron and vitamin B12 deficiency.  EGD showed GERD with esophagitis.  Patient was given IV iron and B12 shot.   Patient states that her abdominal pain started yesterday, which has been progressively worsening, much worse since last night.  The abdominal pain is diffuse, sharp, severe, radiating to the back.  Associated with nausea and vomiting, more than 10 times of nonbloody vomiting.  No diarrhea.  Denies fever or chills.  Patient also reports mild shortness of breath, no cough.  She states that she has lower chest pain which seems to be radiated from epigastric abdominal pain.  No symptoms of UTI.  Patient states that she drinks 1 sometimes, but denies heavily drinking. Patient is currently taking amoxicillin and clarithromycin for H. pylori infection (5/23 - 6/6).  ED Course: pt was found to have lipase 390, troponin level 4 --> 4, negative pregnancy test, pending COVID-19 PCR, renal function okay, liver function (ALP 161, AST 67, ALT 43, total bilirubin 1.2, direct bilirubin 0.3), magnesium 1.5, potassium 3.8.  Patient is placed on MedSurg bed for observation.  CT-abd/pelvis:  1. Extensive inflammation surrounding the pancreas, indicative of acute  pancreatitis. Associated with this there is a large lesion in the head of the pancreas which is of mixed attenuation. The appearance of this is favored to represent a pseudocyst, although the possibility of internal hemorrhage within the pseudocyst is suspected, particularly given the markedly dilated vessels in the surrounding pancreaticoduodenal arcade. Alternatively, a hemorrhagic mass is difficult to exclude, but is not strongly favored given the patient's young age. This could be further characterized with abdominal MRI with and without IV gadolinium with MRCP. 2. Cholelithiasis without evidence of acute cholecystitis at this time. 3. Hepatomegaly with severe hepatic steatosis. 4. Splenomegaly.  Subjective  -Nausea, vomiting, abdominal Weight yesterday improved  Discharge Diagnoses and Hospital Course:   Principal Problem:   Acute pancreatitis Active Problems:   Hypomagnesemia   Normocytic anemia   GERD with esophagitis   H. pylori infection   Chest pain   Acute pancreatitis: lipase 390.  CT scan showed extensive inflammation surrounding the pancreas, indicative of acute pancreatitis. CT also showed a large lesion in the head of the pancreas which is of mixed attenuation, likely represent a pseudocyst, but internal hemorrhage within the pseudocyst is also possible, also need to r/o hemorrhagic mass.  -NPO for pancreatitis -IVF: 2L LR, and then at 150 cc/hr -prn IV dilaudid and oxycodone for pain control -prn IV zofran for nausea -check triglyceride level --> 133 -f/u MRI-abd to further categorize pseudocyst -consulted Dr. 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 This collateral network could be at risk of bleeding given proximity to the large area of walled off necrosis. [facility], Dr. Wyn Quaker is consulted. Per Dr. Wyn Quaker, there is very limited ability to embolize  this area in [facility]. Both Dr. Wyn Quaker of [facility]  and Dr. Allegra Lai of GI recommended to transfer pt to [facility] hospital. I explained to pt and she agreed to go to [facility] hospital. I called [facility].   MRI-abd with and without contrast showed; 1. Hemorrhagic area of walled off necrosis is large and replaces head and uncinate process of the pancreas and is intimately associated with hypertrophied collateral pathways between the SMA and celiac. 2. Celiac flow may be dependent upon this pathway and this collateral network could be at risk of bleeding given proximity to the large area of walled off necrosis. Close attention on follow-up is suggested. 3. Given that the collateral pathway may supply of much of the celiac flow, this is of importance should there be a need for future intervention. 4. Severe acute interstitial edematous pancreatitis involving the pancreatic body and tail. No ductal dilation. Note that given the proximity of the necrotic collection there is a risk for ductal interruption and the duct cannot be clearly tracked through the neck of the pancreas to the ampulla. 5. Extrinsic compression upon the common bile duct and signs of tumefactive sludge or stones in the gallbladder lumen. 6. Marked hepatic steatosis. Variable perfusion/enhancement of the liver could indicate developing liver disease or be related to above findings. No suspicious hepatic lesion. Trace ascites. 7. Mild splenomegaly.  Hypomagnesemia: Magnesium 1.5, potassium 3.8, phosphorus 4.1 -2 g of IV magnesium sulfate  Normocytic anemia: Hemoglobin 13.6. -Continued iron supplement, vitamin B12  GERD with esophagitis -Protonix  H. pylori infection -Continued amoxicillin and clarithromycin (supposed to take from 5/23-6/6)  Chest pain: Very atypical chest pain. Patient reports lower chest pain, which seem to be radiated from epigastric abdominal pain.  Troponin level 4 -->4.  -Observe closely.  No further work-up now.        Discharge  Instructions   Allergies as of 03/29/2021   No Known Allergies   Med Rec must be completed prior to using this [facility]       No Known Allergies  Consultations:  [facility] and GI   Procedures/Studies: DG Chest 2 View  Result Date: 03/29/2021 CLINICAL DATA:  Chest pain EXAM: CHEST - 2 VIEW COMPARISON:  None. FINDINGS: The heart size and mediastinal contours are within normal limits. Both lungs are clear. The visualized skeletal structures are unremarkable. IMPRESSION: No active cardiopulmonary disease. Electronically Signed   By: Deatra Robinson M.D.   On: 03/29/2021 01:12   MR ABDOMEN W WO CONTRAST  Addendum Date: 03/29/2021   ADDENDUM REPORT: 03/29/2021 12:36 ADDENDUM: These results were called by telephone at the time of interpretation on 03/29/2021 at 12:36 pm to provider Lorretta Harp , who verbally acknowledged these results. Electronically Signed   By: Donzetta Kohut M.D.   On: 03/29/2021 12:36   Result Date: 03/29/2021 CLINICAL DATA:  35 year old female with abdominal pain and pancreatitis, history of masslike area in the head of the pancreas EXAM: MRI ABDOMEN WITHOUT AND WITH CONTRAST TECHNIQUE: Multiplanar multisequence MR imaging of the abdomen was performed both before and after the administration of intravenous contrast. CONTRAST:  5mL GADAVIST GADOBUTROL 1 MMOL/ML IV SOLN COMPARISON:  CT evaluation of March 29, 2021. FINDINGS: Lower chest: Lung bases without effusion or sign of consolidative change. Hepatobiliary: Hepatic steatosis with mildly heterogeneous perfusion of the liver. Cyst in the RIGHT hepatic lobe. No focal, suspicious hepatic lesion. The portal vein remains patent despite signs of pancreatitis. SMV is patent. Splenic vein is patent. Biliary tree with caliber change  at the site of mass effect from the pancreatic lesion. Mild distension of the common bile duct up to 5 mm at this level. No signs of filling defect. Cholelithiasis or tumefactive sludge in the gallbladder. Pancreas: In  the pancreatic head is a hemorrhagic collection without internal enhancement that measures 5.7 x 5.2 x 5.7 cm. No visible pancreatic tissue is noted in the head or uncinate. The neck and tail of the pancreas shows normal intrinsic T1 signal. There is no ductal dilation of the dorsal pancreatic duct. There is extensive interstitial edematous change throughout the peripheral remaining pancreas and signs of peripancreatic stranding tracking into the root of the mesentery and the transverse mesocolon. Spleen: Spleen without focal, suspicious lesion. 13 cm greatest craniocaudal dimension. Adrenals/Urinary Tract: Adrenal glands are normal. No perinephric stranding. No hydronephrosis. Smooth renal contours. No suspicious renal lesion. Stomach/Bowel: No acute gastrointestinal process aside from presumed secondary bowel edema in the setting of severe acute interstitial edematous pancreatitis and signs of necrotic pancreatitis as above. Vascular/Lymphatic: Hypertrophied GDA pathways bridging inferior pancreaticoduodenal collaterals to the SMA in the absence of stenosis of the SMA. There is some narrowing of the celiac that is suggested on prior CT imaging likely due to median arcuate ligament impression, this is likely a collateral network as result of this abnormality. Patent portal vein, splenic vein and SMV. No adenopathy Other: Trace ascites. Musculoskeletal: No suspicious bone lesions identified. IMPRESSION: 1. Hemorrhagic area of walled off necrosis is large and replaces head and uncinate process of the pancreas and is intimately associated with hypertrophied collateral pathways between the SMA and celiac. 2. Celiac flow may be dependent upon this pathway and this collateral network could be at risk of bleeding given proximity to the large area of walled off necrosis. Close attention on follow-up is suggested. 3. Given that the collateral pathway may supply of much of the celiac flow, this is of importance should there  be a need for future intervention. 4. Severe acute interstitial edematous pancreatitis involving the pancreatic body and tail. No ductal dilation. Note that given the proximity of the necrotic collection there is a risk for ductal interruption and the duct cannot be clearly tracked through the neck of the pancreas to the ampulla. 5. Extrinsic compression upon the common bile duct and signs of tumefactive sludge or stones in the gallbladder lumen. 6. Marked hepatic steatosis. Variable perfusion/enhancement of the liver could indicate developing liver disease or be related to above findings. No suspicious hepatic lesion. Trace ascites. 7. Mild splenomegaly. A call is out to the referring provider to further discuss findings in the above case. Electronically Signed: By: Donzetta KohutGeoffrey  Wile M.D. On: 03/29/2021 12:29   CT ABDOMEN PELVIS W CONTRAST  Result Date: 03/29/2021 CLINICAL DATA:  35 year old female with history of acute onset of nonlocalized abdominal pain. EXAM: CT ABDOMEN AND PELVIS WITH CONTRAST TECHNIQUE: Multidetector CT imaging of the abdomen and pelvis was performed using the standard protocol following bolus administration of intravenous contrast. CONTRAST:  75mL OMNIPAQUE IOHEXOL 300 MG/ML  SOLN COMPARISON:  CT of the abdomen and pelvis 11/23/2018. FINDINGS: Lower chest: Small amount of subsegmental atelectasis in the right lower lobe. Hepatobiliary: Liver is enlarged measuring 24.8 cm in craniocaudal span. Heterogeneous areas of low attenuation are noted throughout the hepatic parenchyma, indicative of heterogeneous hepatic steatosis. No discrete cystic or solid hepatic lesions. No intra or extrahepatic biliary ductal dilatation. Gallbladder is nearly completely decompressed, but appears to contain several noncalcified gallstones. Pancreas: In the head of the pancreas (  axial image 39 of series 2 and coronal image 30 of series 5) there is a large 5.4 x 4.8 x 5.6 cm rounded lesion of heterogeneous internal  attenuation ranging from low to high attenuation. This exerts mass effect upon adjacent structures. Ectasia of the main pancreatic duct throughout the body and tail of the pancreas. Inflammatory changes are noted surrounding the entire pancreas extending into the adjacent retroperitoneal fat. Spleen: Spleen is enlarged measuring 13.3 x 5.6 x 14.4 cm (estimated splenic volume of 536 mL) . Adrenals/Urinary Tract: Bilateral kidneys and adrenal glands are normal in appearance. No hydroureteronephrosis. Urinary bladder is normal in appearance. Stomach/Bowel: Appearance of the stomach is normal. There is no pathologic dilatation of small bowel or colon. Normal appendix. Vascular/Lymphatic: No significant atherosclerotic disease, aneurysm or dissection noted in the abdominal or pelvic vasculature. Superior mesenteric vein, splenic vein and portal vein are all patent. Prominent blood vessels surrounding the large lesion in the head of the pancreas, most notably a very prominent pancreaticoduodenal artery arcade. No lymphadenopathy noted in the abdomen or pelvis. Reproductive: Uterus and ovaries are unremarkable in appearance. Other: No significant volume of ascites.  No pneumoperitoneum. Musculoskeletal: There are no aggressive appearing lytic or blastic lesions noted in the visualized portions of the skeleton. IMPRESSION: 1. Extensive inflammation surrounding the pancreas, indicative of acute pancreatitis. Associated with this there is a large lesion in the head of the pancreas which is of mixed attenuation. The appearance of this is favored to represent a pseudocyst, although the possibility of internal hemorrhage within the pseudocyst is suspected, particularly given the markedly dilated vessels in the surrounding pancreaticoduodenal arcade. Alternatively, a hemorrhagic mass is difficult to exclude, but is not strongly favored given the patient's young age. This could be further characterized with abdominal MRI with and  without IV gadolinium with MRCP. 2. Cholelithiasis without evidence of acute cholecystitis at this time. 3. Hepatomegaly with severe hepatic steatosis. 4. Splenomegaly. Electronically Signed   By: Trudie Reed M.D.   On: 03/29/2021 06:47   MR 3D Recon At Scanner  Addendum Date: 03/29/2021   ADDENDUM REPORT: 03/29/2021 12:36 ADDENDUM: These results were called by telephone at the time of interpretation on 03/29/2021 at 12:36 pm to provider Lorretta Harp , who verbally acknowledged these results. Electronically Signed   By: Donzetta Kohut M.D.   On: 03/29/2021 12:36   Result Date: 03/29/2021 CLINICAL DATA:  35 year old female with abdominal pain and pancreatitis, history of masslike area in the head of the pancreas EXAM: MRI ABDOMEN WITHOUT AND WITH CONTRAST TECHNIQUE: Multiplanar multisequence MR imaging of the abdomen was performed both before and after the administration of intravenous contrast. CONTRAST:  109mL GADAVIST GADOBUTROL 1 MMOL/ML IV SOLN COMPARISON:  CT evaluation of March 29, 2021. FINDINGS: Lower chest: Lung bases without effusion or sign of consolidative change. Hepatobiliary: Hepatic steatosis with mildly heterogeneous perfusion of the liver. Cyst in the RIGHT hepatic lobe. No focal, suspicious hepatic lesion. The portal vein remains patent despite signs of pancreatitis. SMV is patent. Splenic vein is patent. Biliary tree with caliber change at the site of mass effect from the pancreatic lesion. Mild distension of the common bile duct up to 5 mm at this level. No signs of filling defect. Cholelithiasis or tumefactive sludge in the gallbladder. Pancreas: In the pancreatic head is a hemorrhagic collection without internal enhancement that measures 5.7 x 5.2 x 5.7 cm. No visible pancreatic tissue is noted in the head or uncinate. The neck and tail of the pancreas shows  normal intrinsic T1 signal. There is no ductal dilation of the dorsal pancreatic duct. There is extensive interstitial edematous change  throughout the peripheral remaining pancreas and signs of peripancreatic stranding tracking into the root of the mesentery and the transverse mesocolon. Spleen: Spleen without focal, suspicious lesion. 13 cm greatest craniocaudal dimension. Adrenals/Urinary Tract: Adrenal glands are normal. No perinephric stranding. No hydronephrosis. Smooth renal contours. No suspicious renal lesion. Stomach/Bowel: No acute gastrointestinal process aside from presumed secondary bowel edema in the setting of severe acute interstitial edematous pancreatitis and signs of necrotic pancreatitis as above. Vascular/Lymphatic: Hypertrophied GDA pathways bridging inferior pancreaticoduodenal collaterals to the SMA in the absence of stenosis of the SMA. There is some narrowing of the celiac that is suggested on prior CT imaging likely due to median arcuate ligament impression, this is likely a collateral network as result of this abnormality. Patent portal vein, splenic vein and SMV. No adenopathy Other: Trace ascites. Musculoskeletal: No suspicious bone lesions identified. IMPRESSION: 1. Hemorrhagic area of walled off necrosis is large and replaces head and uncinate process of the pancreas and is intimately associated with hypertrophied collateral pathways between the SMA and celiac. 2. Celiac flow may be dependent upon this pathway and this collateral network could be at risk of bleeding given proximity to the large area of walled off necrosis. Close attention on follow-up is suggested. 3. Given that the collateral pathway may supply of much of the celiac flow, this is of importance should there be a need for future intervention. 4. Severe acute interstitial edematous pancreatitis involving the pancreatic body and tail. No ductal dilation. Note that given the proximity of the necrotic collection there is a risk for ductal interruption and the duct cannot be clearly tracked through the neck of the pancreas to the ampulla. 5. Extrinsic  compression upon the common bile duct and signs of tumefactive sludge or stones in the gallbladder lumen. 6. Marked hepatic steatosis. Variable perfusion/enhancement of the liver could indicate developing liver disease or be related to above findings. No suspicious hepatic lesion. Trace ascites. 7. Mild splenomegaly. A call is out to the referring provider to further discuss findings in the above case. Electronically Signed: By: Donzetta Kohut M.D. On: 03/29/2021 12:29   US Venous Img Lower Bilateral  Result Date: 03/12/2021 CLINICAL DATA:  35 year old female with bilateral lower extremity pain and swelling over the past 2 weeks. EXAM: BILATERAL LOWER EXTREMITY VENOUS DOPPLER ULTRASOUND TECHNIQUE: Gray-scale sonography with graded compression, as well as color Doppler and duplex ultrasound were performed to evaluate the lower extremity deep venous systems from the level of the common femoral vein and including the common femoral, femoral, profunda femoral, popliteal and calf veins including the posterior tibial, peroneal and gastrocnemius veins when visible. The superficial great saphenous vein was also interrogated. Spectral Doppler was utilized to evaluate flow at rest and with distal augmentation maneuvers in the common femoral, femoral and popliteal veins. COMPARISON:  None. FINDINGS: RIGHT LOWER EXTREMITY Common Femoral Vein: No evidence of thrombus. Normal compressibility, respiratory phasicity and response to augmentation. Saphenofemoral Junction: No evidence of thrombus. Normal compressibility and flow on color Doppler imaging. Profunda Femoral Vein: No evidence of thrombus. Normal compressibility and flow on color Doppler imaging. Femoral Vein: No evidence of thrombus. Normal compressibility, respiratory phasicity and response to augmentation. Popliteal Vein: No evidence of thrombus. Normal compressibility, respiratory phasicity and response to augmentation. Calf Veins: No evidence of thrombus. Normal  compressibility and flow on color Doppler imaging. Other Findings:  None. LEFT  LOWER EXTREMITY Common Femoral Vein: No evidence of thrombus. Normal compressibility, respiratory phasicity and response to augmentation. Saphenofemoral Junction: No evidence of thrombus. Normal compressibility and flow on color Doppler imaging. Profunda Femoral Vein: No evidence of thrombus. Normal compressibility and flow on color Doppler imaging. Femoral Vein: No evidence of thrombus. Normal compressibility, respiratory phasicity and response to augmentation. Popliteal Vein: No evidence of thrombus. Normal compressibility, respiratory phasicity and response to augmentation. Calf Veins: No evidence of thrombus. Normal compressibility and flow on color Doppler imaging. Other Findings:  None. IMPRESSION: No evidence of bilateral lower extremity deep venous thrombosis. Marliss Coots, MD [facility] Electronically Signed   By: Marliss Coots MD   On: 03/12/2021 14:16   US PELVIC COMPLETE WITH TRANSVAGINAL  Result Date: 03/13/2021 CLINICAL DATA:  Acute anemia EXAM: TRANSABDOMINAL AND TRANSVAGINAL ULTRASOUND OF PELVIS TECHNIQUE: Both transabdominal and transvaginal ultrasound examinations of the pelvis were performed. Transabdominal technique was performed for global imaging of the pelvis including uterus, ovaries, adnexal regions, and pelvic cul-de-sac. It was necessary to proceed with endovaginal exam following the transabdominal exam to visualize the uterus, endometrium, and ovaries. COMPARISON:  Ultrasound 02/05/2008 FINDINGS: Uterus Measurements: 4.2 x 2.8 x 3.3 cm = volume: 20.4 mL. Retroverted uterus. No concerning uterine mass or discernible fibroid. Endometrium Thickness: 3.3 mm, non thickened.  No focal abnormality visualized. Right ovary Measurements: 3.6 x 2.5 x 2.7 cm = volume: 12.5 mL. Multiple anechoic follicles. Slightly more complex 1.6 cm cyst with reticular echoes  suggesting a hemorrhagic cyst. No concerning adnexal mass. Left ovary Measurements: 2.6 x 1.8 x 2.2 cm = volume: 5.4 mL. Multiple anechoic follicles. No concerning adnexal mass. Other findings There is trace free fluid adjacent the right ovary with some low level internal echoes which could reflect rupture of a hemorrhagic cyst. IMPRESSION: Multiple simple appearing follicles seen in both ovaries. A slightly more complex 1.6 cm lobular cyst with internal echogenicity suggestive of a hemorrhagic cyst is seen in the right adnexa. Only trace free fluid adjacent the right ovary with some low level internal echoes (45/72) can reflect small volume hemorrhage such as from hemorrhagic cyst rupture. Electronically Signed   By: Kreg Shropshire M.D.   On: 03/13/2021 19:16       Discharge Exam: Vitals:   03/29/21 1251 03/29/21 1322  BP: (!) 146/96 (!) 142/95  Pulse: 91 89  Resp: 17   Temp:  98.3 F (36.8 C)  SpO2: 99% 100%   Vitals:   03/29/21 1217 03/29/21 1245 03/29/21 1251 03/29/21 1322  BP:   (!) 146/96 (!) 142/95  Pulse: 80 84 91 89  Resp: 12 12 17    Temp:    98.3 F (36.8 C)  TempSrc:    Oral  SpO2: 97% 97% 99% 100%  Weight:      Height:        General: Not in acute distress HEENT:       Eyes: PERRL, EOMI, no scleral icterus.       ENT: No discharge from the ears and nose, no pharynx injection, no tonsillar enlargement.        Neck: No JVD, no bruit, no mass felt. Heme: No neck lymph node enlargement. Cardiac: S1/S2, RRR, No murmurs, No gallops or rubs. Respiratory: No rales, wheezing, rhonchi or rubs. GI: Soft, nondistended, has diffused tenderness, no rebound pain, BS present. GU: No hematuria Ext: No pitting leg edema bilaterally. 1+DP/PT pulse bilaterally. Musculoskeletal: No joint deformities, No joint redness or  warmth, no limitation of ROM in spin. Skin: No rashes.  Neuro: Alert, oriented X3, cranial nerves II-XII grossly intact, moves all extremities normally.  Psych:  Patient is not psychotic, no suicidal or hemocidal ideation.    The results of significant diagnostics from this hospitalization (including imaging, microbiology, ancillary and laboratory) are listed below for reference.     Microbiology: Recent Results (from the past 240 hour(s))  Resp Panel by RT-PCR (Flu A&B, Covid) Nasopharyngeal Swab     Status: None   Collection Time: 03/29/21  7:31 AM   Specimen: Nasopharyngeal Swab; Nasopharyngeal(NP) swabs in vial transport medium  Result Value Ref Range Status   SARS Coronavirus 2 by RT PCR NEGATIVE NEGATIVE Final    Comment: (NOTE) SARS-CoV-2 target nucleic acids are NOT DETECTED.  The SARS-CoV-2 RNA is generally detectable in upper respiratory specimens during the acute phase of infection. The lowest concentration of SARS-CoV-2 viral copies this assay can detect is 138 copies/mL. A negative result does not preclude SARS-Cov-2 infection and should not be used as the sole basis for treatment or other patient management decisions. A negative result may occur with  improper specimen collection/handling, submission of specimen other than nasopharyngeal swab, presence of viral mutation(s) within the areas targeted by this assay, and inadequate number of viral copies(<138 copies/mL). A negative result must be combined with clinical observations, patient history, and epidemiological information. The expected result is Negative.  Fact Sheet for Patients:  BloggerCourse.com  Fact Sheet for Healthcare Providers:  SeriousBroker.it  This test is no t yet approved or cleared by the [place] FDA and  has been authorized for detection and/or diagnosis of SARS-CoV-2 by FDA under an Emergency Use Authorization (EUA). This EUA will remain  in effect (meaning this test can be used) for the duration of the COVID-19 declaration under Section 564(b)(1) of the Act, 21 U.S.C.section 360bbb-3(b)(1),  unless the authorization is terminated  or revoked sooner.       Influenza A by PCR NEGATIVE NEGATIVE Final   Influenza B by PCR NEGATIVE NEGATIVE Final    Comment: (NOTE) The Xpert Xpress SARS-CoV-2/FLU/RSV plus assay is intended as an aid in the diagnosis of influenza from Nasopharyngeal swab specimens and should not be used as a sole basis for treatment. Nasal washings and aspirates are unacceptable for Xpert Xpress SARS-CoV-2/FLU/RSV testing.  Fact Sheet for Patients: BloggerCourse.com  Fact Sheet for Healthcare Providers: SeriousBroker.it  This test is not yet approved or cleared by the [place] FDA and has been authorized for detection and/or diagnosis of SARS-CoV-2 by FDA under an Emergency Use Authorization (EUA). This EUA will remain in effect (meaning this test can be used) for the duration of the COVID-19 declaration under Section 564(b)(1) of the Act, 21 U.S.C. section 360bbb-3(b)(1), unless the authorization is terminated or revoked.  Performed at [facility], [address] [place], [place] [address]      Labs: BNP (last 3 results) No results for input(s): BNP in the last 8760 hours. Basic Metabolic Panel: Recent Labs  Lab 03/29/21 0023 03/29/21 0246  NA 138  --   K 3.8  --   CL 106  --   CO2 20*  --   GLUCOSE 184*  --   BUN <5*  --   CREATININE 0.52  --   CALCIUM 9.4  --   MG  --  1.5*  PHOS 4.1  --    Liver Function Tests: Recent Labs  Lab 03/29/21 0246  AST 67*  ALT 43  ALKPHOS 161*  BILITOT 1.2  PROT 7.3  ALBUMIN 3.3*   Recent Labs  Lab 03/29/21 0246  LIPASE 390*   No results for input(s): AMMONIA in the last 168 hours. CBC: Recent Labs  Lab 03/29/21 0023  WBC 8.0  HGB 13.6  HCT 44.6  MCV 85.9  PLT 307   Cardiac Enzymes: No results for input(s): CKTOTAL, CKMB, CKMBINDEX, TROPONINI in the last 168 hours. BNP: Invalid input(s): POCBNP CBG: No results  for input(s): GLUCAP in the last 168 hours. D-Dimer No results for input(s): DDIMER in the last 72 hours. Hgb A1c No results for input(s): HGBA1C in the last 72 hours. Lipid Profile Recent Labs    03/29/21 0023  TRIG 133   Thyroid function studies No results for input(s): TSH, T4TOTAL, T3FREE, THYROIDAB in the last 72 hours.  Invalid input(s): FREET3 Anemia work up No results for input(s): VITAMINB12, FOLATE, FERRITIN, TIBC, IRON, RETICCTPCT in the last 72 hours. Urinalysis    Component Value Date/Time   COLORURINE AMBER (A) 03/05/2019 0318   APPEARANCEUR CLOUDY (A) 03/05/2019 0318   LABSPEC 1.018 03/05/2019 0318   PHURINE 5.0 03/05/2019 0318   GLUCOSEU NEGATIVE 03/05/2019 0318   HGBUR NEGATIVE 03/05/2019 0318   BILIRUBINUR NEGATIVE 03/05/2019 0318   KETONESUR 20 (A) 03/05/2019 0318   PROTEINUR NEGATIVE 03/05/2019 0318   NITRITE NEGATIVE 03/05/2019 0318   LEUKOCYTESUR NEGATIVE 03/05/2019 0318   Sepsis Labs Invalid input(s): PROCALCITONIN,  WBC,  LACTICIDVEN Microbiology Recent Results (from the past 240 hour(s))  Resp Panel by RT-PCR (Flu A&B, Covid) Nasopharyngeal Swab     Status: None   Collection Time: 03/29/21  7:31 AM   Specimen: Nasopharyngeal Swab; Nasopharyngeal(NP) swabs in vial transport medium  Result Value Ref Range Status   SARS Coronavirus 2 by RT PCR NEGATIVE NEGATIVE Final    Comment: (NOTE) SARS-CoV-2 target nucleic acids are NOT DETECTED.  The SARS-CoV-2 RNA is generally detectable in upper respiratory specimens during the acute phase of infection. The lowest concentration of SARS-CoV-2 viral copies this assay can detect is 138 copies/mL. A negative result does not preclude SARS-Cov-2 infection and should not be used as the sole basis for treatment or other patient management decisions. A negative result may occur with  improper specimen collection/handling, submission of specimen other than nasopharyngeal swab, presence of viral mutation(s) within  the areas targeted by this assay, and inadequate number of viral copies(<138 copies/mL). A negative result must be combined with clinical observations, patient history, and epidemiological information. The expected result is Negative.  Fact Sheet for Patients:  BloggerCourse.com  Fact Sheet for Healthcare Providers:  SeriousBroker.it  This test is no t yet approved or cleared by the [place] FDA and  has been authorized for detection and/or diagnosis of SARS-CoV-2 by FDA under an Emergency Use Authorization (EUA). This EUA will remain  in effect (meaning this test can be used) for the duration of the COVID-19 declaration under Section 564(b)(1) of the Act, 21 U.S.C.section 360bbb-3(b)(1), unless the authorization is terminated  or revoked sooner.       Influenza A by PCR NEGATIVE NEGATIVE Final   Influenza B by PCR NEGATIVE NEGATIVE Final    Comment: (NOTE) The Xpert Xpress SARS-CoV-2/FLU/RSV plus assay is intended as an aid in the diagnosis of influenza from Nasopharyngeal swab specimens and should not be used as a sole basis for treatment. Nasal washings and aspirates are unacceptable for Xpert Xpress SARS-CoV-2/FLU/RSV testing.  Fact Sheet for Patients: BloggerCourse.com  Fact  Sheet for Healthcare Providers: SeriousBroker.it  This test is not yet approved or cleared by the [place] and has been authorized for detection and/or diagnosis of SARS-CoV-2 by FDA under an Emergency Use Authorization (EUA). This EUA will remain in effect (meaning this test can be used) for the duration of the COVID-19 declaration under Section 564(b)(1) of the Act, 21 U.S.C. section 360bbb-3(b)(1), unless the authorization is terminated or revoked.  Performed at [facility], [address] [place], [place] [address]     Time coordinating discharge:  25 minutes.    SIGNED:  Lorretta Harp, MD [facility] 03/29/2021, 3:17 PM   If 7PM-7AM, please contact night-coverage [email or web address]

## 2021-03-29 NOTE — ED Provider Notes (Signed)
[facility] Emergency Department Provider Note  ____________________________________________   Event Date/Time   First MD Initiated Contact with Patient 03/29/21 0330     (approximate)  I have reviewed the triage vital signs and the nursing notes.   HISTORY  Chief Complaint Chest Pain    HPI Sandra Pearson is a 35 y.o. female history of hypertension, anemia who presents to the emergency department with multiple complaints.  She complains of diffuse sharp abdominal pain that radiates into her back, nausea and vomiting.  She states she is also started having chest pain since vomiting.  States that it feels like pain that is radiating up from her abdomen and.  She reports she is also having pain into her legs diffusely and that makes her legs feel "paralyzed".  She denies any diarrhea.  No fevers, dysuria, hematuria, vaginal bleeding or discharge, bloody stools, melena.  No previous abdominal surgery.  Was just recently admitted for symptomatic anemia.  Denies bloody stools or melena.  Reports associated shortness of breath that is improving.  No sick contacts or recent travel.  She is on antibiotics currently for H. pylori.        Past Medical History:  Diagnosis Date  . Anemia   . Hypertension     Patient Active Problem List   Diagnosis Date Noted  . Acute pancreatitis 03/29/2021  . Normocytic anemia 03/29/2021  . GERD with esophagitis 03/29/2021  . H. pylori infection 03/29/2021  . Hypertension   . Iron deficiency anemia   . Gastric erythema   . Hiatal hernia   . Acute esophagitis   . Acute anemia 03/13/2021  . Severe anemia 03/12/2021  . Hypokalemia 03/12/2021  . Hypomagnesemia 03/12/2021  . Complex ovarian cyst 11/22/2018  . Intractable vomiting with nausea 11/22/2018    Past Surgical History:  Procedure Laterality Date  . COLONOSCOPY N/A 03/15/2021   Procedure: COLONOSCOPY;  Surgeon: Pasty Spillers, MD;  Location[facility] ENDOSCOPY;   Service: Endoscopy;  Laterality: N/A;  . ESOPHAGOGASTRODUODENOSCOPY N/A 03/15/2021   Procedure: ESOPHAGOGASTRODUODENOSCOPY (EGD);  Surgeon: Pasty Spillers, MD;  Location: [facility] ENDOSCOPY;  Service: Endoscopy;  Laterality: N/A;  . MANDIBLE FRACTURE SURGERY      Prior to Admission medications   Medication Sig Start Date End Date Taking? Authorizing Provider  amoxicillin (AMOXIL) 500 MG tablet Take 2 tablets (1,000 mg total) by mouth 2 (two) times daily for 14 days. 03/18/21 04/01/21  Pasty Spillers, MD  clarithromycin (BIAXIN) 500 MG tablet Take 1 tablet (500 mg total) by mouth 2 (two) times daily for 14 days. 03/18/21 04/01/21  Pasty Spillers, MD  ferrous sulfate 325 (65 FE) MG tablet Take 1 tablet (325 mg total) by mouth daily. 03/15/21 04/14/21  Marrion Coy, MD  folic acid (FOLVITE) 1 MG tablet Take 1 tablet (1 mg total) by mouth daily. Patient not taking: No sig reported 02/18/19   Triplett, Rulon Eisenmenger B, FNP  gabapentin (NEURONTIN) 100 MG capsule Take 1 capsule (100 mg total) by mouth 3 (three) times daily as needed. 02/18/19 05/19/19  Triplett, Rulon Eisenmenger B, FNP  ondansetron (ZOFRAN) 4 MG tablet Take 1 tablet (4 mg total) by mouth every 8 (eight) hours as needed for nausea or vomiting. Patient not taking: No sig reported 02/18/19   Kem Boroughs B, FNP  oxyCODONE-acetaminophen (PERCOCET/ROXICET) 5-325 MG tablet Take 1 tablet by mouth every 4 (four) hours as needed for severe pain. Patient not taking: No sig reported 03/18/20   Irean Hong, MD  pantoprazole (PROTONIX) 20 MG tablet Take 1 tablet (20 mg total) by mouth in the morning and at bedtime for 14 days. 03/18/21 04/01/21  Pasty Spillers, MD  promethazine (PHENERGAN) 25 MG tablet Take 1 tablet (25 mg total) by mouth every 6 (six) hours as needed for nausea or vomiting. Patient not taking: No sig reported 03/06/19   Minna Antis, MD  traMADol (ULTRAM) 50 MG tablet Take 1 tablet (50 mg total) by mouth every 6 (six) hours as  needed. Patient not taking: No sig reported 02/18/19   Kem Boroughs B, FNP  vitamin B-12 (CYANOCOBALAMIN) 1000 MCG tablet Take 1 tablet (1,000 mcg total) by mouth daily. 03/15/21   Marrion Coy, MD    Allergies Patient has no known allergies.  Family History  Problem Relation Age of Onset  . Hepatitis C Mother   . Heart disease Mother     Social History Social History   Tobacco Use  . Smoking status: Current Every Day Smoker    Packs/day: 1.00    Types: Cigarettes  . Smokeless tobacco: Never Used  Vaping Use  . Vaping Use: Some days  Substance Use Topics  . Alcohol use: No    Comment: rarely   . Drug use: No    Review of Systems Constitutional: No fever. Eyes: No visual changes. ENT: No sore throat. Cardiovascular: + chest pain. Respiratory: +  shortness of breath. Gastrointestinal: + Nausea and vomiting. Genitourinary: Negative for dysuria. Musculoskeletal: Negative for back pain. Skin: Negative for rash. Neurological: Negative for focal weakness or numbness.  ____________________________________________   PHYSICAL EXAM:  VITAL SIGNS: ED Triage Vitals  Enc Vitals Group     BP 03/29/21 0005 113/86     Pulse Rate 03/29/21 0005 (!) 101     Resp 03/29/21 0005 16     Temp 03/29/21 0104 97.6 F (36.4 C)     Temp Source 03/29/21 0104 Oral     SpO2 03/29/21 0005 100 %     Weight 03/29/21 0006 125 lb (56.7 kg)     Height 03/29/21 0006 5\' 9"  (1.753 m)     Head Circumference --      Peak Flow --      Pain Score 03/29/21 0006 9     Pain Loc --      Pain Edu? --      Excl. in GC? --    CONSTITUTIONAL: Alert and oriented and responds appropriately to questions.  Pale in appearance.  Appears uncomfortable.  Actively vomiting. HEAD: Normocephalic EYES: Conjunctivae clear, pupils appear equal, EOM appear intact ENT: normal nose; moist mucous membranes, poor dentition NECK: Supple, normal ROM CARD: RRR; S1 and S2 appreciated; no murmurs, no clicks, no rubs, no  gallops RESP: Normal chest excursion without splinting or tachypnea; breath sounds clear and equal bilaterally; no wheezes, no rhonchi, no rales, no hypoxia or respiratory distress, speaking full sentences ABD/GI: Normal bowel sounds; slightly distended; soft, diffusely tender to palpation with intermittent guarding, no rebound BACK: The back appears normal EXT: Normal ROM in all joints; no deformity noted, no edema; no cyanosis SKIN: Normal color for age and race; warm; no rash on exposed skin NEURO: Moves all extremities equally, able to move her lower extremities without difficulty, normal speech, no facial asymmetry PSYCH: The patient's mood and manner are appropriate.  ____________________________________________   LABS (all labs ordered are listed, but only abnormal results are displayed)  Labs Reviewed  BASIC METABOLIC PANEL - Abnormal; Notable for the following components:  Result Value   CO2 20 (*)    Glucose, Bld 184 (*)    BUN <5 (*)    All other components within normal limits  CBC - Abnormal; Notable for the following components:   RBC 5.19 (*)    RDW 23.3 (*)    All other components within normal limits  HEPATIC FUNCTION PANEL - Abnormal; Notable for the following components:   Albumin 3.3 (*)    AST 67 (*)    Alkaline Phosphatase 161 (*)    Bilirubin, Direct 0.3 (*)    All other components within normal limits  LIPASE, BLOOD - Abnormal; Notable for the following components:   Lipase 390 (*)    All other components within normal limits  MAGNESIUM - Abnormal; Notable for the following components:   Magnesium 1.5 (*)    All other components within normal limits  RESP PANEL BY RT-PCR (FLU A&B, COVID) ARPGX2  HCG, QUANTITATIVE, PREGNANCY  URINE DRUG SCREEN, QUALITATIVE ([facility] ONLY)  URINALYSIS, COMPLETE (UACMP) WITH MICROSCOPIC  TRIGLYCERIDES  PHOSPHORUS  TROPONIN I (HIGH SENSITIVITY)  TROPONIN I (HIGH SENSITIVITY)    ____________________________________________  EKG   EKG Interpretation  Date/Time:  Friday March 29 2021 04:05:54 EDT Ventricular Rate:  78 PR Interval:  142 QRS Duration: 84 QT Interval:  412 QTC Calculation: 469 R Axis:   41 Text Interpretation: Normal sinus rhythm Normal ECG Confirmed by Rochele RaringWard, Mulan Adan [phone]([id number]) on 03/29/2021 4:26:30 AM       ____________________________________________  RADIOLOGY Normajean BaxterI, Adrien Dietzman, personally viewed and evaluated these images (plain radiographs) as part of my medical decision making, as well as reviewing the written report by the radiologist.  ED MD interpretation: Pancreatitis seen on CT imaging.  Chest x-ray clear.  Official radiology report(s): DG Chest 2 View  Result Date: 03/29/2021 CLINICAL DATA:  Chest pain EXAM: CHEST - 2 VIEW COMPARISON:  None. FINDINGS: The heart size and mediastinal contours are within normal limits. Both lungs are clear. The visualized skeletal structures are unremarkable. IMPRESSION: No active cardiopulmonary disease. Electronically Signed   By: Deatra RobinsonKevin  Herman M.D.   On: 03/29/2021 01:12   CT ABDOMEN PELVIS W CONTRAST  Result Date: 03/29/2021 CLINICAL DATA:  35 year old female with history of acute onset of nonlocalized abdominal pain. EXAM: CT ABDOMEN AND PELVIS WITH CONTRAST TECHNIQUE: Multidetector CT imaging of the abdomen and pelvis was performed using the standard protocol following bolus administration of intravenous contrast. CONTRAST:  75mL OMNIPAQUE IOHEXOL 300 MG/ML  SOLN COMPARISON:  CT of the abdomen and pelvis 11/23/2018. FINDINGS: Lower chest: Small amount of subsegmental atelectasis in the right lower lobe. Hepatobiliary: Liver is enlarged measuring 24.8 cm in craniocaudal span. Heterogeneous areas of low attenuation are noted throughout the hepatic parenchyma, indicative of heterogeneous hepatic steatosis. No discrete cystic or solid hepatic lesions. No intra or extrahepatic biliary ductal dilatation.  Gallbladder is nearly completely decompressed, but appears to contain several noncalcified gallstones. Pancreas: In the head of the pancreas (axial image 39 of series 2 and coronal image 30 of series 5) there is a large 5.4 x 4.8 x 5.6 cm rounded lesion of heterogeneous internal attenuation ranging from low to high attenuation. This exerts mass effect upon adjacent structures. Ectasia of the main pancreatic duct throughout the body and tail of the pancreas. Inflammatory changes are noted surrounding the entire pancreas extending into the adjacent retroperitoneal fat. Spleen: Spleen is enlarged measuring 13.3 x 5.6 x 14.4 cm (estimated splenic volume of 536 mL) . Adrenals/Urinary Tract: Bilateral kidneys and  adrenal glands are normal in appearance. No hydroureteronephrosis. Urinary bladder is normal in appearance. Stomach/Bowel: Appearance of the stomach is normal. There is no pathologic dilatation of small bowel or colon. Normal appendix. Vascular/Lymphatic: No significant atherosclerotic disease, aneurysm or dissection noted in the abdominal or pelvic vasculature. Superior mesenteric vein, splenic vein and portal vein are all patent. Prominent blood vessels surrounding the large lesion in the head of the pancreas, most notably a very prominent pancreaticoduodenal artery arcade. No lymphadenopathy noted in the abdomen or pelvis. Reproductive: Uterus and ovaries are unremarkable in appearance. Other: No significant volume of ascites.  No pneumoperitoneum. Musculoskeletal: There are no aggressive appearing lytic or blastic lesions noted in the visualized portions of the skeleton. IMPRESSION: 1. Extensive inflammation surrounding the pancreas, indicative of acute pancreatitis. Associated with this there is a large lesion in the head of the pancreas which is of mixed attenuation. The appearance of this is favored to represent a pseudocyst, although the possibility of internal hemorrhage within the pseudocyst is  suspected, particularly given the markedly dilated vessels in the surrounding pancreaticoduodenal arcade. Alternatively, a hemorrhagic mass is difficult to exclude, but is not strongly favored given the patient's young age. This could be further characterized with abdominal MRI with and without IV gadolinium with MRCP. 2. Cholelithiasis without evidence of acute cholecystitis at this time. 3. Hepatomegaly with severe hepatic steatosis. 4. Splenomegaly. Electronically Signed   By: Trudie Reed M.D.   On: 03/29/2021 06:47    ____________________________________________   PROCEDURES  Procedure(s) performed (including Critical Care):  Procedures   ____________________________________________   INITIAL IMPRESSION / ASSESSMENT AND PLAN / ED COURSE  As part of my medical decision making, I reviewed the following data within the electronic MEDICAL RECORD NUMBER Nursing notes reviewed and incorporated, Labs reviewed , EKG interpreted , Old EKG reviewed, Old chart reviewed, Radiograph reviewed , Discussed with admitting physician  and Notes from prior ED visits         Patient here with complaints of diffuse pain worse in the abdomen with nausea and vomiting.  Actively vomiting here.  Differential includes cholecystitis, pancreatitis, colitis, diverticulitis, appendicitis, UTI, pyelonephritis, kidney stone, bowel obstruction, perforation, viral gastroenteritis.  Her chest pain seems atypical in nature.  She has had 2 normal troponins and an EKG that shows no ischemia.  Doubt PE, dissection.  Chest x-ray clear with no signs of pneumonia, volume overload, widened mediastinum, pneumothorax.  Will give morphine.  First EKG shows slightly prolonged QT interval.  We will check electrolytes and repeat EKG prior to giving antiemetics.  Discussed with Dr. Manson Passey with radiology who recommends given patient's size that we give her IV contrast despite the national contrast shortage especially given she has not able  to tolerate p.o. at this time.  She was just recently admitted for symptomatic anemia.  Denies any active bleeding and currently her hemoglobin is 13.6.  ED PROGRESS  4:27 am Repeat EKG shows that her QT interval has improved.  Her magnesium level is slightly low at 1.5.  Will give IV replacement.  Potassium level normal.  LFTs normal but mildly elevated lipase.  This could be pancreatitis.  CT of the abdomen pelvis pending.  7:27 AM  Pt's CT scan shows extensive inflammation around the pancreas with possible pseudocyst versus internal hemorrhage within the pancreas.  Alternatively hemorrhagic mass difficult to exclude and radiology recommends abdominal MRI with and without contrast with MRCP.  She has cholelithiasis without cholecystitis.  Reports pain and nausea are returning.  We will continue IV hydration, keep her n.p.o. and continue pain and nausea medicine.  Will discuss with hospitalist for admission.  She denies previous history of pancreatitis.  States she does drink alcohol occasionally.  Last drink was 3 days ago.  7:54 AM Discussed patient's case with hospitalist, Dr. Clyde Lundborg.  I have recommended admission and patient (and family if present) agree with this plan. Admitting physician will place admission orders.   I reviewed all nursing notes, vitals, pertinent previous records and reviewed/interpreted all EKGs, lab and urine results, imaging (as available).   ____________________________________________   FINAL CLINICAL IMPRESSION(S) / ED DIAGNOSES  Final diagnoses:  Nausea and vomiting in adult  Atypical chest pain  Hypomagnesemia  Acute pancreatitis without infection or necrosis, unspecified pancreatitis type     ED Discharge Orders    None      *Please note:  Sandra Pearson was evaluated in Emergency Department on 03/29/2021 for the symptoms described in the history of present illness. She was evaluated in the context of the global COVID-19 pandemic, which necessitated  consideration that the patient might be at risk for infection with the SARS-CoV-2 virus that causes COVID-19. Institutional protocols and algorithms that pertain to the evaluation of patients at risk for COVID-19 are in a state of rapid change based on information released by regulatory bodies including the CDC and federal and state organizations. These policies and algorithms were followed during the patient's care in the ED.  Some ED evaluations and interventions may be delayed as a result of limited staffing during and the pandemic.*   Note:  This document was prepared using Dragon voice recognition software and may include unintentional dictation errors.   Adalina Dopson, Layla Maw, DO 03/29/21 [phone]

## 2021-03-30 DIAGNOSIS — Z8719 Personal history of other diseases of the digestive system: Secondary | ICD-10-CM

## 2021-03-30 DIAGNOSIS — K8501 Idiopathic acute pancreatitis with uninfected necrosis: Secondary | ICD-10-CM

## 2021-03-30 DIAGNOSIS — K8689 Other specified diseases of pancreas: Secondary | ICD-10-CM

## 2021-03-30 LAB — CBC
HCT: 35.3 % — ABNORMAL LOW (ref 36.0–46.0)
Hemoglobin: 10.4 g/dL — ABNORMAL LOW (ref 12.0–15.0)
MCH: 26.4 pg (ref 26.0–34.0)
MCHC: 29.5 g/dL — ABNORMAL LOW (ref 30.0–36.0)
MCV: 89.6 fL (ref 80.0–100.0)
Platelets: 152 10*3/uL (ref 150–400)
RBC: 3.94 MIL/uL (ref 3.87–5.11)
RDW: 22.9 % — ABNORMAL HIGH (ref 11.5–15.5)
WBC: 5.6 10*3/uL (ref 4.0–10.5)
nRBC: 0 % (ref 0.0–0.2)

## 2021-03-30 LAB — MAGNESIUM: Magnesium: 2.1 mg/dL (ref 1.7–2.4)

## 2021-03-30 LAB — URINALYSIS, ROUTINE W REFLEX MICROSCOPIC
Bilirubin Urine: NEGATIVE
Glucose, UA: NEGATIVE mg/dL
Hgb urine dipstick: NEGATIVE
Ketones, ur: NEGATIVE mg/dL
Leukocytes,Ua: NEGATIVE
Nitrite: NEGATIVE
Protein, ur: NEGATIVE mg/dL
Specific Gravity, Urine: 1.009 (ref 1.005–1.030)
pH: 8 (ref 5.0–8.0)

## 2021-03-30 LAB — COMPREHENSIVE METABOLIC PANEL
ALT: 28 U/L (ref 0–44)
AST: 43 U/L — ABNORMAL HIGH (ref 15–41)
Albumin: 2.3 g/dL — ABNORMAL LOW (ref 3.5–5.0)
Alkaline Phosphatase: 114 U/L (ref 38–126)
Anion gap: 5 (ref 5–15)
BUN: <5 mg/dL — ABNORMAL LOW (ref 6–20)
CO2: 28 mmol/L (ref 22–32)
Calcium: 8.2 mg/dL — ABNORMAL LOW (ref 8.9–10.3)
Chloride: 102 mmol/L (ref 98–111)
Creatinine, Ser: 0.59 mg/dL (ref 0.44–1.00)
GFR, Estimated: >60 mL/min (ref >60)
Glucose, Bld: 100 mg/dL — ABNORMAL HIGH (ref 70–99)
Potassium: 3.6 mmol/L (ref 3.5–5.1)
Sodium: 135 mmol/L (ref 135–145)
Total Bilirubin: 1.2 mg/dL (ref 0.3–1.2)
Total Protein: 5.4 g/dL — ABNORMAL LOW (ref 6.5–8.1)

## 2021-03-30 LAB — GLUCOSE, CAPILLARY
Glucose-Capillary: 101 mg/dL — ABNORMAL HIGH (ref 70–99)
Glucose-Capillary: 107 mg/dL — ABNORMAL HIGH (ref 70–99)
Glucose-Capillary: 144 mg/dL — ABNORMAL HIGH (ref 70–99)
Glucose-Capillary: 85 mg/dL (ref 70–99)
Glucose-Capillary: 92 mg/dL (ref 70–99)
Glucose-Capillary: 97 mg/dL (ref 70–99)

## 2021-03-30 LAB — TYPE AND SCREEN
ABO/RH(D): A POS
Antibody Screen: NEGATIVE

## 2021-03-30 LAB — HEMOGLOBIN AND HEMATOCRIT, BLOOD
HCT: 33.6 % — ABNORMAL LOW (ref 36.0–46.0)
HCT: 37.2 % (ref 36.0–46.0)
HCT: 33.4 % — ABNORMAL LOW (ref 36.0–46.0)
Hemoglobin: 9.9 g/dL — ABNORMAL LOW (ref 12.0–15.0)
Hemoglobin: 10.8 g/dL — ABNORMAL LOW (ref 12.0–15.0)
Hemoglobin: 9.7 g/dL — ABNORMAL LOW (ref 12.0–15.0)

## 2021-03-30 LAB — LIPASE, BLOOD: Lipase: 179 U/L — ABNORMAL HIGH (ref 11–51)

## 2021-03-30 LAB — C-REACTIVE PROTEIN: CRP: 2.5 mg/dL — ABNORMAL HIGH (ref <1.0)

## 2021-03-30 LAB — SEDIMENTATION RATE: Sed Rate: 2 mm/hr (ref 0–22)

## 2021-03-30 LAB — PHOSPHORUS: Phosphorus: 4 mg/dL (ref 2.5–4.6)

## 2021-03-30 MED ORDER — LORAZEPAM 2 MG/ML IJ SOLN: 1.0000 mg | INTRAMUSCULAR | Status: DC | PRN

## 2021-03-30 MED ORDER — DIPHENHYDRAMINE HCL 25 MG PO CAPS
25.0000 mg | ORAL_CAPSULE | Freq: Four times a day (QID) | ORAL | Status: DC | PRN
  Administered 2021-03-30 (×2): 25 mg via ORAL
  Filled 2021-03-30 (×2): qty 1

## 2021-03-30 MED ORDER — SENNOSIDES-DOCUSATE SODIUM 8.6-50 MG PO TABS
1.0000 | ORAL_TABLET | Freq: Every day | ORAL | Status: DC
  Administered 2021-03-30 – 2021-03-31 (×2): 1 via ORAL
  Filled 2021-03-30 (×2): qty 1

## 2021-03-30 MED ORDER — LORAZEPAM 1 MG PO TABS: 1.0000 mg | ORAL_TABLET | ORAL | Status: DC | PRN

## 2021-03-30 MED ORDER — PIPERACILLIN-TAZOBACTAM 3.375 G IVPB
3.3750 g | Freq: Three times a day (TID) | INTRAVENOUS | Status: DC
  Administered 2021-03-30 – 2021-04-01 (×9): 3.375 g via INTRAVENOUS
  Filled 2021-03-30 (×9): qty 50

## 2021-03-30 MED ORDER — ACETAMINOPHEN 325 MG PO TABS
650.0000 mg | ORAL_TABLET | Freq: Four times a day (QID) | ORAL | Status: DC | PRN
  Administered 2021-03-31: 650 mg via ORAL
  Filled 2021-03-30: qty 2

## 2021-03-30 MED ORDER — MAGNESIUM SULFATE 2 GM/50ML IV SOLN
2.0000 g | Freq: Once | INTRAVENOUS | Status: AC
  Administered 2021-03-30: 2 g via INTRAVENOUS
  Filled 2021-03-30: qty 50

## 2021-03-30 NOTE — Consult Note (Signed)
Reason for Consult:pancreatitis with hemorrhagic necrosis  Referring Physician: Dow Adolph  [facility] is an 34 y.o. female.  HPI: 35yo F with a past medical history significant for GERD, esophagitis, hiatal hernia, complex ovarian cyst, iron deficiency anemia, H. pylori infection, tobacco use disorder, alcohol use disorder, alcohol induced acute pancreatitis in 2020 who was initially admitted at [facility] due to severe acute pancreatitis with walled off necrosis, hemorrhage and hypertrophic collaterals within the celiac and superior mesenteric artery. Vascular Surgery and General Surgery consulted at [facility] and recommended transfer to a higher level of care. I was consulted once she was transferred to [facility]. This AM she reports her abdominal pain is less and she only feels discomfort if she palpates her abdomen. The pain is much less sharp and is located R of midline. She had N/V last night but this has resolved.  Review of Systems: Review of systems as noted in the HPI. All other systems reviewed and are negative. Past Medical History:  Diagnosis Date  . Anemia   . Hypertension     Past Surgical History:  Procedure Laterality Date  . COLONOSCOPY N/A 03/15/2021   Procedure: COLONOSCOPY;  Surgeon: Pasty Spillers, MD;  Location: [facility] ENDOSCOPY;  Service: Endoscopy;  Laterality: N/A;  . ESOPHAGOGASTRODUODENOSCOPY N/A 03/15/2021   Procedure: ESOPHAGOGASTRODUODENOSCOPY (EGD);  Surgeon: Pasty Spillers, MD;  Location: [facility] ENDOSCOPY;  Service: Endoscopy;  Laterality: N/A;  . MANDIBLE FRACTURE SURGERY      Family History  Problem Relation Age of Onset  . Hepatitis C Mother   . Heart disease Mother     Social History:  reports that she has been smoking cigarettes. She has been smoking about 1.00 pack per day. She has never used smokeless tobacco. She reports that she does not drink alcohol and does not use drugs.  Allergies: No Known Allergies  Medications: I have reviewed the  patient's current medications.  Results for orders placed or performed during the hospital encounter of 03/29/21 (from the past 48 hour(s))  Resp Panel by RT-PCR (Flu A&B, Covid) Nasopharyngeal Swab     Status: None   Collection Time: 03/29/21  9:49 PM   Specimen: Nasopharyngeal Swab; Nasopharyngeal(NP) swabs in vial transport medium  Result Value Ref Range   SARS Coronavirus 2 by RT PCR NEGATIVE NEGATIVE    Comment: (NOTE) SARS-CoV-2 target nucleic acids are NOT DETECTED.  The SARS-CoV-2 RNA is generally detectable in upper respiratory specimens during the acute phase of infection. The lowest concentration of SARS-CoV-2 viral copies this assay can detect is 138 copies/mL. A negative result does not preclude SARS-Cov-2 infection and should not be used as the sole basis for treatment or other patient management decisions. A negative result may occur with  improper specimen collection/handling, submission of specimen other than nasopharyngeal swab, presence of viral mutation(s) within the areas targeted by this assay, and inadequate number of viral copies(<138 copies/mL). A negative result must be combined with clinical observations, patient history, and epidemiological information. The expected result is Negative.  Fact Sheet for Patients:  BloggerCourse.com  Fact Sheet for Healthcare Providers:  SeriousBroker.it  This test is no t yet approved or cleared by the [place] FDA and  has been authorized for detection and/or diagnosis of SARS-CoV-2 by FDA under an Emergency Use Authorization (EUA). This EUA will remain  in effect (meaning this test can be used) for the duration of the COVID-19 declaration under Section 564(b)(1) of the Act, 21 U.S.C.section 360bbb-3(b)(1), unless the authorization is terminated  or revoked sooner.       Influenza A by PCR NEGATIVE NEGATIVE   Influenza B by PCR NEGATIVE NEGATIVE    Comment:  (NOTE) The Xpert Xpress SARS-CoV-2/FLU/RSV plus assay is intended as an aid in the diagnosis of influenza from Nasopharyngeal swab specimens and should not be used as a sole basis for treatment. Nasal washings and aspirates are unacceptable for Xpert Xpress SARS-CoV-2/FLU/RSV testing.  Fact Sheet for Patients: BloggerCourse.com  Fact Sheet for Healthcare Providers: SeriousBroker.it  This test is not yet approved or cleared by the [place] FDA and has been authorized for detection and/or diagnosis of SARS-CoV-2 by FDA under an Emergency Use Authorization (EUA). This EUA will remain in effect (meaning this test can be used) for the duration of the COVID-19 declaration under Section 564(b)(1) of the Act, 21 U.S.C. section 360bbb-3(b)(1), unless the authorization is terminated or revoked.  Performed at [facility], [address], [place], [place] [address]   Glucose, capillary     Status: Abnormal   Collection Time: 03/30/21 12:09 AM  Result Value Ref Range   Glucose-Capillary 107 (H) 70 - 99 mg/dL    Comment: Glucose reference range applies only to samples taken after fasting for at least 8 hours.  Hemoglobin and hematocrit, blood     Status: Abnormal   Collection Time: 03/30/21 12:50 AM  Result Value Ref Range   Hemoglobin 9.9 (L) 12.0 - 15.0 g/dL   HCT 60.4 (L) 54.0 - 98.1 %    Comment: Performed at [facility], [address], [place], [place] [address]  Glucose, capillary     Status: None   Collection Time: 03/30/21  4:38 AM  Result Value Ref Range   Glucose-Capillary 92 70 - 99 mg/dL    Comment: Glucose reference range applies only to samples taken after fasting for at least 8 hours.  CBC     Status: Abnormal   Collection Time: 03/30/21  5:47 AM  Result Value Ref Range   WBC 5.6 4.0 - 10.5 K/uL   RBC 3.94 3.87 - 5.11 MIL/uL   Hemoglobin 10.4 (L) 12.0 - 15.0 g/dL   HCT 82.9 (L) 56.2 - 13.0 %   MCV  89.6 80.0 - 100.0 fL   MCH 26.4 26.0 - 34.0 pg   MCHC 29.5 (L) 30.0 - 36.0 g/dL   RDW 86.5 (H) 78.4 - 69.6 %   Platelets 152 150 - 400 K/uL   nRBC 0.0 0.0 - 0.2 %    Comment: Performed at [facility], [address], [place], [place] [address]  Urinalysis, Routine w reflex microscopic Urine, Clean Catch     Status: None   Collection Time: 03/30/21  6:03 AM  Result Value Ref Range   Color, Urine YELLOW YELLOW   APPearance CLEAR CLEAR   Specific Gravity, Urine 1.009 1.005 - 1.030   pH 8.0 5.0 - 8.0   Glucose, UA NEGATIVE NEGATIVE mg/dL   Hgb urine dipstick NEGATIVE NEGATIVE   Bilirubin Urine NEGATIVE NEGATIVE   Ketones, ur NEGATIVE NEGATIVE mg/dL   Protein, ur NEGATIVE NEGATIVE mg/dL   Nitrite NEGATIVE NEGATIVE   Leukocytes,Ua NEGATIVE NEGATIVE    Comment: Performed at [facility], [address], [place], [place] [address]    DG Chest 2 View  Result Date: 03/29/2021 CLINICAL DATA:  Chest pain EXAM: CHEST - 2 VIEW COMPARISON:  None. FINDINGS: The heart size and mediastinal contours are within normal limits. Both lungs are  clear. The visualized skeletal structures are unremarkable. IMPRESSION: No active cardiopulmonary disease. Electronically Signed   By: Deatra Robinson M.D.   On: 03/29/2021 01:12   MR ABDOMEN W WO CONTRAST  Addendum Date: 03/29/2021   ADDENDUM REPORT: 03/29/2021 12:36 ADDENDUM: These results were called by telephone at the time of interpretation on 03/29/2021 at 12:36 pm to provider Lorretta Harp , who verbally acknowledged these results. Electronically Signed   By: Donzetta Kohut M.D.   On: 03/29/2021 12:36   Result Date: 03/29/2021 CLINICAL DATA:  35 year old female with abdominal pain and pancreatitis, history of masslike area in the head of the pancreas EXAM: MRI ABDOMEN WITHOUT AND WITH CONTRAST TECHNIQUE: Multiplanar multisequence MR imaging of the abdomen was performed both before and after the administration of intravenous contrast. CONTRAST:  79mL GADAVIST  GADOBUTROL 1 MMOL/ML IV SOLN COMPARISON:  CT evaluation of March 29, 2021. FINDINGS: Lower chest: Lung bases without effusion or sign of consolidative change. Hepatobiliary: Hepatic steatosis with mildly heterogeneous perfusion of the liver. Cyst in the RIGHT hepatic lobe. No focal, suspicious hepatic lesion. The portal vein remains patent despite signs of pancreatitis. SMV is patent. Splenic vein is patent. Biliary tree with caliber change at the site of mass effect from the pancreatic lesion. Mild distension of the common bile duct up to 5 mm at this level. No signs of filling defect. Cholelithiasis or tumefactive sludge in the gallbladder. Pancreas: In the pancreatic head is a hemorrhagic collection without internal enhancement that measures 5.7 x 5.2 x 5.7 cm. No visible pancreatic tissue is noted in the head or uncinate. The neck and tail of the pancreas shows normal intrinsic T1 signal. There is no ductal dilation of the dorsal pancreatic duct. There is extensive interstitial edematous change throughout the peripheral remaining pancreas and signs of peripancreatic stranding tracking into the root of the mesentery and the transverse mesocolon. Spleen: Spleen without focal, suspicious lesion. 13 cm greatest craniocaudal dimension. Adrenals/Urinary Tract: Adrenal glands are normal. No perinephric stranding. No hydronephrosis. Smooth renal contours. No suspicious renal lesion. Stomach/Bowel: No acute gastrointestinal process aside from presumed secondary bowel edema in the setting of severe acute interstitial edematous pancreatitis and signs of necrotic pancreatitis as above. Vascular/Lymphatic: Hypertrophied GDA pathways bridging inferior pancreaticoduodenal collaterals to the SMA in the absence of stenosis of the SMA. There is some narrowing of the celiac that is suggested on prior CT imaging likely due to median arcuate ligament impression, this is likely a collateral network as result of this abnormality. Patent  portal vein, splenic vein and SMV. No adenopathy Other: Trace ascites. Musculoskeletal: No suspicious bone lesions identified. IMPRESSION: 1. Hemorrhagic area of walled off necrosis is large and replaces head and uncinate process of the pancreas and is intimately associated with hypertrophied collateral pathways between the SMA and celiac. 2. Celiac flow may be dependent upon this pathway and this collateral network could be at risk of bleeding given proximity to the large area of walled off necrosis. Close attention on follow-up is suggested. 3. Given that the collateral pathway may supply of much of the celiac flow, this is of importance should there be a need for future intervention. 4. Severe acute interstitial edematous pancreatitis involving the pancreatic body and tail. No ductal dilation. Note that given the proximity of the necrotic collection there is a risk for ductal interruption and the duct cannot be clearly tracked through the neck of the pancreas to the ampulla. 5. Extrinsic compression upon the common bile duct and signs of  tumefactive sludge or stones in the gallbladder lumen. 6. Marked hepatic steatosis. Variable perfusion/enhancement of the liver could indicate developing liver disease or be related to above findings. No suspicious hepatic lesion. Trace ascites. 7. Mild splenomegaly. A call is out to the referring provider to further discuss findings in the above case. Electronically Signed: By: Donzetta KohutGeoffrey  Wile M.D. On: 03/29/2021 12:29   CT ABDOMEN PELVIS W CONTRAST  Result Date: 03/29/2021 CLINICAL DATA:  35 year old female with history of acute onset of nonlocalized abdominal pain. EXAM: CT ABDOMEN AND PELVIS WITH CONTRAST TECHNIQUE: Multidetector CT imaging of the abdomen and pelvis was performed using the standard protocol following bolus administration of intravenous contrast. CONTRAST:  75mL OMNIPAQUE IOHEXOL 300 MG/ML  SOLN COMPARISON:  CT of the abdomen and pelvis 11/23/2018. FINDINGS:  Lower chest: Small amount of subsegmental atelectasis in the right lower lobe. Hepatobiliary: Liver is enlarged measuring 24.8 cm in craniocaudal span. Heterogeneous areas of low attenuation are noted throughout the hepatic parenchyma, indicative of heterogeneous hepatic steatosis. No discrete cystic or solid hepatic lesions. No intra or extrahepatic biliary ductal dilatation. Gallbladder is nearly completely decompressed, but appears to contain several noncalcified gallstones. Pancreas: In the head of the pancreas (axial image 39 of series 2 and coronal image 30 of series 5) there is a large 5.4 x 4.8 x 5.6 cm rounded lesion of heterogeneous internal attenuation ranging from low to high attenuation. This exerts mass effect upon adjacent structures. Ectasia of the main pancreatic duct throughout the body and tail of the pancreas. Inflammatory changes are noted surrounding the entire pancreas extending into the adjacent retroperitoneal fat. Spleen: Spleen is enlarged measuring 13.3 x 5.6 x 14.4 cm (estimated splenic volume of 536 mL) . Adrenals/Urinary Tract: Bilateral kidneys and adrenal glands are normal in appearance. No hydroureteronephrosis. Urinary bladder is normal in appearance. Stomach/Bowel: Appearance of the stomach is normal. There is no pathologic dilatation of small bowel or colon. Normal appendix. Vascular/Lymphatic: No significant atherosclerotic disease, aneurysm or dissection noted in the abdominal or pelvic vasculature. Superior mesenteric vein, splenic vein and portal vein are all patent. Prominent blood vessels surrounding the large lesion in the head of the pancreas, most notably a very prominent pancreaticoduodenal artery arcade. No lymphadenopathy noted in the abdomen or pelvis. Reproductive: Uterus and ovaries are unremarkable in appearance. Other: No significant volume of ascites.  No pneumoperitoneum. Musculoskeletal: There are no aggressive appearing lytic or blastic lesions noted in the  visualized portions of the skeleton. IMPRESSION: 1. Extensive inflammation surrounding the pancreas, indicative of acute pancreatitis. Associated with this there is a large lesion in the head of the pancreas which is of mixed attenuation. The appearance of this is favored to represent a pseudocyst, although the possibility of internal hemorrhage within the pseudocyst is suspected, particularly given the markedly dilated vessels in the surrounding pancreaticoduodenal arcade. Alternatively, a hemorrhagic mass is difficult to exclude, but is not strongly favored given the patient's young age. This could be further characterized with abdominal MRI with and without IV gadolinium with MRCP. 2. Cholelithiasis without evidence of acute cholecystitis at this time. 3. Hepatomegaly with severe hepatic steatosis. 4. Splenomegaly. Electronically Signed   By: Trudie Reedaniel  Entrikin M.D.   On: 03/29/2021 06:47   MR 3D Recon At Scanner  Addendum Date: 03/29/2021   ADDENDUM REPORT: 03/29/2021 12:36 ADDENDUM: These results were called by telephone at the time of interpretation on 03/29/2021 at 12:36 pm to provider Lorretta HarpXILIN NIU , who verbally acknowledged these results. Electronically Signed   By:  Donzetta Kohut M.D.   On: 03/29/2021 12:36   Result Date: 03/29/2021 CLINICAL DATA:  35 year old female with abdominal pain and pancreatitis, history of masslike area in the head of the pancreas EXAM: MRI ABDOMEN WITHOUT AND WITH CONTRAST TECHNIQUE: Multiplanar multisequence MR imaging of the abdomen was performed both before and after the administration of intravenous contrast. CONTRAST:  5mL GADAVIST GADOBUTROL 1 MMOL/ML IV SOLN COMPARISON:  CT evaluation of March 29, 2021. FINDINGS: Lower chest: Lung bases without effusion or sign of consolidative change. Hepatobiliary: Hepatic steatosis with mildly heterogeneous perfusion of the liver. Cyst in the RIGHT hepatic lobe. No focal, suspicious hepatic lesion. The portal vein remains patent despite  signs of pancreatitis. SMV is patent. Splenic vein is patent. Biliary tree with caliber change at the site of mass effect from the pancreatic lesion. Mild distension of the common bile duct up to 5 mm at this level. No signs of filling defect. Cholelithiasis or tumefactive sludge in the gallbladder. Pancreas: In the pancreatic head is a hemorrhagic collection without internal enhancement that measures 5.7 x 5.2 x 5.7 cm. No visible pancreatic tissue is noted in the head or uncinate. The neck and tail of the pancreas shows normal intrinsic T1 signal. There is no ductal dilation of the dorsal pancreatic duct. There is extensive interstitial edematous change throughout the peripheral remaining pancreas and signs of peripancreatic stranding tracking into the root of the mesentery and the transverse mesocolon. Spleen: Spleen without focal, suspicious lesion. 13 cm greatest craniocaudal dimension. Adrenals/Urinary Tract: Adrenal glands are normal. No perinephric stranding. No hydronephrosis. Smooth renal contours. No suspicious renal lesion. Stomach/Bowel: No acute gastrointestinal process aside from presumed secondary bowel edema in the setting of severe acute interstitial edematous pancreatitis and signs of necrotic pancreatitis as above. Vascular/Lymphatic: Hypertrophied GDA pathways bridging inferior pancreaticoduodenal collaterals to the SMA in the absence of stenosis of the SMA. There is some narrowing of the celiac that is suggested on prior CT imaging likely due to median arcuate ligament impression, this is likely a collateral network as result of this abnormality. Patent portal vein, splenic vein and SMV. No adenopathy Other: Trace ascites. Musculoskeletal: No suspicious bone lesions identified. IMPRESSION: 1. Hemorrhagic area of walled off necrosis is large and replaces head and uncinate process of the pancreas and is intimately associated with hypertrophied collateral pathways between the SMA and celiac. 2.  Celiac flow may be dependent upon this pathway and this collateral network could be at risk of bleeding given proximity to the large area of walled off necrosis. Close attention on follow-up is suggested. 3. Given that the collateral pathway may supply of much of the celiac flow, this is of importance should there be a need for future intervention. 4. Severe acute interstitial edematous pancreatitis involving the pancreatic body and tail. No ductal dilation. Note that given the proximity of the necrotic collection there is a risk for ductal interruption and the duct cannot be clearly tracked through the neck of the pancreas to the ampulla. 5. Extrinsic compression upon the common bile duct and signs of tumefactive sludge or stones in the gallbladder lumen. 6. Marked hepatic steatosis. Variable perfusion/enhancement of the liver could indicate developing liver disease or be related to above findings. No suspicious hepatic lesion. Trace ascites. 7. Mild splenomegaly. A call is out to the referring provider to further discuss findings in the above case. Electronically Signed: By: Donzetta Kohut M.D. On: 03/29/2021 12:29    Review of Systems Blood pressure 124/77, temperature 98.5 F (  36.9 C), temperature source Oral, resp. rate 18, weight 62.1 kg, SpO2 100 %, unknown if currently breastfeeding. Physical Exam Constitutional:      Appearance: Normal appearance.  HENT:     Head: Normocephalic.     Right Ear: External ear normal.     Left Ear: External ear normal.     Nose: Nose normal.     Mouth/Throat:     Mouth: Mucous membranes are moist.  Eyes:     Pupils: Pupils are equal, round, and reactive to light.  Cardiovascular:     Rate and Rhythm: Normal rate and regular rhythm.     Pulses: Normal pulses.     Heart sounds: Normal heart sounds.  Pulmonary:     Effort: Pulmonary effort is normal.     Breath sounds: Normal breath sounds. No wheezing or rhonchi.  Abdominal:     General: Abdomen is flat.      Palpations: Abdomen is soft. There is no mass.     Tenderness: There is abdominal tenderness. There is no guarding or rebound.  Musculoskeletal:        General: No swelling.     Cervical back: Neck supple.  Skin:    General: Skin is warm and dry.     Capillary Refill: Capillary refill takes less than 2 seconds.  Neurological:     Mental Status: She is alert and oriented to person, place, and time.  Psychiatric:        Mood and Affect: Mood normal.     Assessment/Plan: Severe alcoholic pancreatitis with hemorrhagic necrosis - agree with bowel rest and planned GI evaluation. Follow serial Hb. If this bleeds further, recommend IR for angioembolization. There is not a good surgical option and no need for emergent surgery at this point. This is very complicated and warrants evaluation at an academic center.  Liz Malady 03/30/2021, 6:38 AM

## 2021-03-30 NOTE — Progress Notes (Signed)
PROGRESS NOTE   Sandra Pearson   ZOX:[id number]    DOB: [birth year]    DOA: 03/29/2021  PCP: Patient, No Pcp Per (Inactive)   I have briefly reviewed patients previous medical records in [facility].  Chief complaint: Abdominal pain  Brief Narrative:  35 year old female with medical history significant for but not limited to GERD with esophagitis, hiatal hernia, complex ovarian cyst, multifactorial anemia (iron and B12 deficiency), H. pylori infection, tobacco and alcohol use disorder, alcohol induced acute pancreatitis in 2020, HTN recent hospitalization 5/17- 5/20 due to severe anemia felt to be due to malabsorption, underwent upper and lower endoscopy, found to have H. pylori, initiated on triple therapy, treated with IV iron and B12 shots, initially admitted at [facility] due to abdominal pain with a diagnosis of severe pancreatitis with hemorrhagic necrosis and abnormal vasculature surrounding the pancreas and celiac vessels.  Vascular surgery and general surgery consulted at [facility] and recommended transfer to higher level of care and patient was [facility]  [facility] and general surgery have evaluated here.  Treating supportively.   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  Other differential diagnoses they offer: Drug-induced (recently started clarithromycin for H. pylori, pantoprazole), autoimmune versus others.?  Biliary etiology.  Hemodynamically stable.  Starting on clear liquids, advancing to full liquids today and if does well then soft diet tomorrow.  If unable to tolerate diet then may need NG tube feeds.  IV fluids, IV Zosyn and pain management.  Hold chemical DVT  prophylaxis due to bleeding risks.  Monitor CBCs closely, stable for now.  If becomes unstable, will need repeat CTA of the abdomen.  When stable, GI recommends follow-up should be at one of the quaternary centers [facility]  Per general surgery, there is not a good surgical option and no need for emergent surgery at this point.  Per vascular surgery evaluation at [facility], did not recommend vascular intervention at this time, as this will be extremely difficult to embolize successfully due to large number of blood vessels feeding the area, very large size of the blood vessels and the multiple different vessels that will have to be embolized.  Recent H pylori infection: Per GI, hold off on reinitiation of H. pylori antibiotics due to clarithromycin related risk of pancreatitis.  Could consider alternate regimen once stabilized.  Multifactorial anemia (iron and B12 deficiency): S/p recent EGD and colonoscopy.  Hemoglobin stable in the 10 g range which may be her baseline.  Monitor CBCs closely and transfuse if hemoglobin 7 g or less.  Moreover if her hemoglobin significantly drops, will need CTA abdomen as noted above.  Continue B12 supplements.  Hold iron supplements for now.  Continue PPI.  Hypomagnesemia: Replaced.  Abnormal LFTs: Mild.  May be due to fatty liver noted on imaging or alcohol related.  Trend CMP.  Alcohol use disorder: Absolute alcohol cessation has been counseled by multiple providers.  No overt withdrawal.  CIWA protocol.  Bilateral feet pain: May be peripheral neuropathy related to alcohol, B12 deficiency versus others.  Continue thiamine, folate and B12 supplements.  Also topical moisturizing lotion since feet appear dry.  GERD with esophagitis: PPI.  Body mass index is 20.22 kg/m.  DVT prophylaxis: SCDs Start: 03/29/21 2214     Code Status: Full Code Family Communication: None at bedside. Disposition:  Status is: Inpatient  Remains inpatient appropriate  because:Inpatient level of care appropriate due to severity of illness   Dispo:  Patient From: Home  Planned Disposition: Home  Medically stable for discharge: No          Consultants:   [facility] General surgery  Procedures:   None  Antimicrobials:    Anti-infectives (From admission, onward)   Start     Dose/Rate Route Frequency Ordered Stop   03/30/21 0100  piperacillin-tazobactam (ZOSYN) IVPB 3.375 g        3.375 g 12.5 mL/hr over 240 Minutes Intravenous Every 8 hours 03/30/21 0012          Subjective:  Reports feeling much better.  Abdominal pain has significantly improved, rates at 2/10 in severity.  Now more bothered by bilateral feet pain, subacute on chronic.  No nausea or vomiting.  Had BM 2 days ago.  Objective:   Vitals:   03/30/21 0200 03/30/21 1358  BP: 124/77 118/84  Pulse:  74  Resp: 18 18  Temp: 98.5 F (36.9 C) 99 F (37.2 C)  TempSrc: Oral Oral  SpO2: 100% 98%  Weight: 62.1 kg     General exam: Young female, moderately built and thinly nourished, lying comfortably supine in bed without distress.  Does not look septic or toxic. Respiratory system: Clear to auscultation. Respiratory effort normal. Cardiovascular system: S1 & S2 heard, RRR. No JVD, murmurs, rubs, gallops or clicks. No pedal edema.  Telemetry personally reviewed: Sinus rhythm. Gastrointestinal system: Abdomen is nondistended, soft and nontender. No organomegaly or masses felt. Normal bowel sounds heard. Central nervous system: Alert and oriented. No focal neurological deficits. Extremities: Symmetric 5 x 5 power. Skin: Bilateral feet with dry skin, minimal scaling, sensitive to touch, no features suggestive of cellulitis, good bilateral dorsalis pedis pulses appreciated. Psychiatry: Judgement and insight appear normal. Mood & affect appropriate.     Data Reviewed:   I have personally reviewed following labs and imaging studies   CBC: Recent Labs  Lab 03/29/21 0023  03/30/21 0050 03/30/21 0547 03/30/21 1347  WBC 8.0  --  5.6  --   HGB 13.6 9.9* 10.4* 10.8*  HCT 44.6 33.6* 35.3* 37.2  MCV 85.9  --  89.6  --   PLT 307  --  152  --     Basic Metabolic Panel: Recent Labs  Lab 03/29/21 0023 03/29/21 0246 03/30/21 0547  NA 138  --  135  K 3.8  --  3.6  CL 106  --  102  CO2 20*  --  28  GLUCOSE 184*  --  100*  BUN <5*  --  <5*  CREATININE 0.52  --  0.59  CALCIUM 9.4  --  8.2*  MG  --  1.5* 2.1  PHOS 4.1  --  4.0    Liver Function Tests: Recent Labs  Lab 03/29/21 0246 03/30/21 0547  AST 67* 43*  ALT 43 28  ALKPHOS 161* 114  BILITOT 1.2 1.2  PROT 7.3 5.4*  ALBUMIN 3.3* 2.3*    CBG: Recent Labs  Lab 03/30/21 0825 03/30/21 1157 03/30/21 1556  GLUCAP 101* 97 85    Microbiology Studies:   Recent Results (from the past 240 hour(s))  Resp Panel by RT-PCR (Flu A&B, Covid) Nasopharyngeal Swab     Status: None   Collection Time: 03/29/21  7:31 AM  Specimen: Nasopharyngeal Swab; Nasopharyngeal(NP) swabs in vial transport medium  Result Value Ref Range Status   SARS Coronavirus 2 by RT PCR NEGATIVE NEGATIVE Final    Comment: (NOTE) SARS-CoV-2 target nucleic acids are NOT DETECTED.  The SARS-CoV-2 RNA is generally detectable in upper respiratory specimens during the acute phase of infection. The lowest concentration of SARS-CoV-2 viral copies this assay can detect is 138 copies/mL. A negative result does not preclude SARS-Cov-2 infection and should not be used as the sole basis for treatment or other patient management decisions. A negative result may occur with  improper specimen collection/handling, submission of specimen other than nasopharyngeal swab, presence of viral mutation(s) within the areas targeted by this assay, and inadequate number of viral copies(<138 copies/mL). A negative result must be combined with clinical observations, patient history, and epidemiological information. The expected result is  Negative.  Fact Sheet for Patients:  BloggerCourse.com  Fact Sheet for Healthcare Providers:  SeriousBroker.it  This test is no t yet approved or cleared by the [place] FDA and  has been authorized for detection and/or diagnosis of SARS-CoV-2 by FDA under an Emergency Use Authorization (EUA). This EUA will remain  in effect (meaning this test can be used) for the duration of the COVID-19 declaration under Section 564(b)(1) of the Act, 21 U.S.C.section 360bbb-3(b)(1), unless the authorization is terminated  or revoked sooner.       Influenza A by PCR NEGATIVE NEGATIVE Final   Influenza B by PCR NEGATIVE NEGATIVE Final    Comment: (NOTE) The Xpert Xpress SARS-CoV-2/FLU/RSV plus assay is intended as an aid in the diagnosis of influenza from Nasopharyngeal swab specimens and should not be used as a sole basis for treatment. Nasal washings and aspirates are unacceptable for Xpert Xpress SARS-CoV-2/FLU/RSV testing.  Fact Sheet for Patients: BloggerCourse.com  Fact Sheet for Healthcare Providers: SeriousBroker.it  This test is not yet approved or cleared by the [place] FDA and has been authorized for detection and/or diagnosis of SARS-CoV-2 by FDA under an Emergency Use Authorization (EUA). This EUA will remain in effect (meaning this test can be used) for the duration of the COVID-19 declaration under Section 564(b)(1) of the Act, 21 U.S.C. section 360bbb-3(b)(1), unless the authorization is terminated or revoked.  Performed at [facility], [address] [place], [place] [address]   Resp Panel by RT-PCR (Flu A&B, Covid) Nasopharyngeal Swab     Status: None   Collection Time: 03/29/21  9:49 PM   Specimen: Nasopharyngeal Swab; Nasopharyngeal(NP) swabs in vial transport medium  Result Value Ref Range Status   SARS Coronavirus 2 by RT PCR NEGATIVE NEGATIVE  Final    Comment: (NOTE) SARS-CoV-2 target nucleic acids are NOT DETECTED.  The SARS-CoV-2 RNA is generally detectable in upper respiratory specimens during the acute phase of infection. The lowest concentration of SARS-CoV-2 viral copies this assay can detect is 138 copies/mL. A negative result does not preclude SARS-Cov-2 infection and should not be used as the sole basis for treatment or other patient management decisions. A negative result may occur with  improper specimen collection/handling, submission of specimen other than nasopharyngeal swab, presence of viral mutation(s) within the areas targeted by this assay, and inadequate number of viral copies(<138 copies/mL). A negative result must be combined with clinical observations, patient history, and epidemiological information. The expected result is Negative.  Fact Sheet for Patients:  BloggerCourse.com  Fact Sheet for Healthcare Providers:  SeriousBroker.it  This test is no t yet approved or cleared by the  [place] [employer] and  has been authorized for detection and/or diagnosis of SARS-CoV-2 by FDA under an TEFL teacher (EUA). This EUA will remain  in effect (meaning this test can be used) for the duration of the COVID-19 declaration under Section 564(b)(1) of the Act, 21 U.S.C.section 360bbb-3(b)(1), unless the authorization is terminated  or revoked sooner.       Influenza A by PCR NEGATIVE NEGATIVE Final   Influenza B by PCR NEGATIVE NEGATIVE Final    Comment: (NOTE) The Xpert Xpress SARS-CoV-2/FLU/RSV plus assay is intended as an aid in the diagnosis of influenza from Nasopharyngeal swab specimens and should not be used as a sole basis for treatment. Nasal washings and aspirates are unacceptable for Xpert Xpress SARS-CoV-2/FLU/RSV testing.  Fact Sheet for Patients: BloggerCourse.com  Fact Sheet for Healthcare  Providers: SeriousBroker.it  This test is not yet approved or cleared by the [place] FDA and has been authorized for detection and/or diagnosis of SARS-CoV-2 by FDA under an Emergency Use Authorization (EUA). This EUA will remain in effect (meaning this test can be used) for the duration of the COVID-19 declaration under Section 564(b)(1) of the Act, 21 U.S.C. section 360bbb-3(b)(1), unless the authorization is terminated or revoked.  Performed at [facility], [address] [place], [place] [address]      Radiology Studies:  DG Chest 2 View  Result Date: 03/29/2021 CLINICAL DATA:  Chest pain EXAM: CHEST - 2 VIEW COMPARISON:  None. FINDINGS: The heart size and mediastinal contours are within normal limits. Both lungs are clear. The visualized skeletal structures are unremarkable. IMPRESSION: No active cardiopulmonary disease. Electronically Signed   By: Deatra Robinson M.D.   On: 03/29/2021 01:12   MR ABDOMEN W WO CONTRAST  Addendum Date: 03/29/2021   ADDENDUM REPORT: 03/29/2021 12:36 ADDENDUM: These results were called by telephone at the time of interpretation on 03/29/2021 at 12:36 pm to provider Lorretta Harp , who verbally acknowledged these results. Electronically Signed   By: Donzetta Kohut M.D.   On: 03/29/2021 12:36   Result Date: 03/29/2021 CLINICAL DATA:  35 year old female with abdominal pain and pancreatitis, history of masslike area in the head of the pancreas EXAM: MRI ABDOMEN WITHOUT AND WITH CONTRAST TECHNIQUE: Multiplanar multisequence MR imaging of the abdomen was performed both before and after the administration of intravenous contrast. CONTRAST:  5mL GADAVIST GADOBUTROL 1 MMOL/ML IV SOLN COMPARISON:  CT evaluation of March 29, 2021. FINDINGS: Lower chest: Lung bases without effusion or sign of consolidative change. Hepatobiliary: Hepatic steatosis with mildly heterogeneous perfusion of the liver. Cyst in the RIGHT hepatic lobe. No focal,  suspicious hepatic lesion. The portal vein remains patent despite signs of pancreatitis. SMV is patent. Splenic vein is patent. Biliary tree with caliber change at the site of mass effect from the pancreatic lesion. Mild distension of the common bile duct up to 5 mm at this level. No signs of filling defect. Cholelithiasis or tumefactive sludge in the gallbladder. Pancreas: In the pancreatic head is a hemorrhagic collection without internal enhancement that measures 5.7 x 5.2 x 5.7 cm. No visible pancreatic tissue is noted in the head or uncinate. The neck and tail of the pancreas shows normal intrinsic T1 signal. There is no ductal dilation of the dorsal pancreatic duct. There is extensive interstitial edematous change throughout the peripheral remaining pancreas and signs of peripancreatic stranding tracking into the root of the mesentery and the transverse mesocolon. Spleen: Spleen without focal, suspicious lesion. 13 cm greatest  craniocaudal dimension. Adrenals/Urinary Tract: Adrenal glands are normal. No perinephric stranding. No hydronephrosis. Smooth renal contours. No suspicious renal lesion. Stomach/Bowel: No acute gastrointestinal process aside from presumed secondary bowel edema in the setting of severe acute interstitial edematous pancreatitis and signs of necrotic pancreatitis as above. Vascular/Lymphatic: Hypertrophied GDA pathways bridging inferior pancreaticoduodenal collaterals to the SMA in the absence of stenosis of the SMA. There is some narrowing of the celiac that is suggested on prior CT imaging likely due to median arcuate ligament impression, this is likely a collateral network as result of this abnormality. Patent portal vein, splenic vein and SMV. No adenopathy Other: Trace ascites. Musculoskeletal: No suspicious bone lesions identified. IMPRESSION: 1. Hemorrhagic area of walled off necrosis is large and replaces head and uncinate process of the pancreas and is intimately associated with  hypertrophied collateral pathways between the SMA and celiac. 2. Celiac flow may be dependent upon this pathway and this collateral network could be at risk of bleeding given proximity to the large area of walled off necrosis. Close attention on follow-up is suggested. 3. Given that the collateral pathway may supply of much of the celiac flow, this is of importance should there be a need for future intervention. 4. Severe acute interstitial edematous pancreatitis involving the pancreatic body and tail. No ductal dilation. Note that given the proximity of the necrotic collection there is a risk for ductal interruption and the duct cannot be clearly tracked through the neck of the pancreas to the ampulla. 5. Extrinsic compression upon the common bile duct and signs of tumefactive sludge or stones in the gallbladder lumen. 6. Marked hepatic steatosis. Variable perfusion/enhancement of the liver could indicate developing liver disease or be related to above findings. No suspicious hepatic lesion. Trace ascites. 7. Mild splenomegaly. A call is out to the referring provider to further discuss findings in the above case. Electronically Signed: By: Donzetta Kohut M.D. On: 03/29/2021 12:29   CT ABDOMEN PELVIS W CONTRAST  Result Date: 03/29/2021 CLINICAL DATA:  35 year old female with history of acute onset of nonlocalized abdominal pain. EXAM: CT ABDOMEN AND PELVIS WITH CONTRAST TECHNIQUE: Multidetector CT imaging of the abdomen and pelvis was performed using the standard protocol following bolus administration of intravenous contrast. CONTRAST:  46mL OMNIPAQUE IOHEXOL 300 MG/ML  SOLN COMPARISON:  CT of the abdomen and pelvis 11/23/2018. FINDINGS: Lower chest: Small amount of subsegmental atelectasis in the right lower lobe. Hepatobiliary: Liver is enlarged measuring 24.8 cm in craniocaudal span. Heterogeneous areas of low attenuation are noted throughout the hepatic parenchyma, indicative of heterogeneous hepatic  steatosis. No discrete cystic or solid hepatic lesions. No intra or extrahepatic biliary ductal dilatation. Gallbladder is nearly completely decompressed, but appears to contain several noncalcified gallstones. Pancreas: In the head of the pancreas (axial image 39 of series 2 and coronal image 30 of series 5) there is a large 5.4 x 4.8 x 5.6 cm rounded lesion of heterogeneous internal attenuation ranging from low to high attenuation. This exerts mass effect upon adjacent structures. Ectasia of the main pancreatic duct throughout the body and tail of the pancreas. Inflammatory changes are noted surrounding the entire pancreas extending into the adjacent retroperitoneal fat. Spleen: Spleen is enlarged measuring 13.3 x 5.6 x 14.4 cm (estimated splenic volume of 536 mL) . Adrenals/Urinary Tract: Bilateral kidneys and adrenal glands are normal in appearance. No hydroureteronephrosis. Urinary bladder is normal in appearance. Stomach/Bowel: Appearance of the stomach is normal. There is no pathologic dilatation of small bowel or colon. Normal  appendix. Vascular/Lymphatic: No significant atherosclerotic disease, aneurysm or dissection noted in the abdominal or pelvic vasculature. Superior mesenteric vein, splenic vein and portal vein are all patent. Prominent blood vessels surrounding the large lesion in the head of the pancreas, most notably a very prominent pancreaticoduodenal artery arcade. No lymphadenopathy noted in the abdomen or pelvis. Reproductive: Uterus and ovaries are unremarkable in appearance. Other: No significant volume of ascites.  No pneumoperitoneum. Musculoskeletal: There are no aggressive appearing lytic or blastic lesions noted in the visualized portions of the skeleton. IMPRESSION: 1. Extensive inflammation surrounding the pancreas, indicative of acute pancreatitis. Associated with this there is a large lesion in the head of the pancreas which is of mixed attenuation. The appearance of this is favored  to represent a pseudocyst, although the possibility of internal hemorrhage within the pseudocyst is suspected, particularly given the markedly dilated vessels in the surrounding pancreaticoduodenal arcade. Alternatively, a hemorrhagic mass is difficult to exclude, but is not strongly favored given the patient's young age. This could be further characterized with abdominal MRI with and without IV gadolinium with MRCP. 2. Cholelithiasis without evidence of acute cholecystitis at this time. 3. Hepatomegaly with severe hepatic steatosis. 4. Splenomegaly. Electronically Signed   By: Trudie Reed M.D.   On: 03/29/2021 06:47   MR 3D Recon At Scanner  Addendum Date: 03/29/2021   ADDENDUM REPORT: 03/29/2021 12:36 ADDENDUM: These results were called by telephone at the time of interpretation on 03/29/2021 at 12:36 pm to provider Lorretta Harp , who verbally acknowledged these results. Electronically Signed   By: Donzetta Kohut M.D.   On: 03/29/2021 12:36   Result Date: 03/29/2021 CLINICAL DATA:  35 year old female with abdominal pain and pancreatitis, history of masslike area in the head of the pancreas EXAM: MRI ABDOMEN WITHOUT AND WITH CONTRAST TECHNIQUE: Multiplanar multisequence MR imaging of the abdomen was performed both before and after the administration of intravenous contrast. CONTRAST:  68mL GADAVIST GADOBUTROL 1 MMOL/ML IV SOLN COMPARISON:  CT evaluation of March 29, 2021. FINDINGS: Lower chest: Lung bases without effusion or sign of consolidative change. Hepatobiliary: Hepatic steatosis with mildly heterogeneous perfusion of the liver. Cyst in the RIGHT hepatic lobe. No focal, suspicious hepatic lesion. The portal vein remains patent despite signs of pancreatitis. SMV is patent. Splenic vein is patent. Biliary tree with caliber change at the site of mass effect from the pancreatic lesion. Mild distension of the common bile duct up to 5 mm at this level. No signs of filling defect. Cholelithiasis or tumefactive  sludge in the gallbladder. Pancreas: In the pancreatic head is a hemorrhagic collection without internal enhancement that measures 5.7 x 5.2 x 5.7 cm. No visible pancreatic tissue is noted in the head or uncinate. The neck and tail of the pancreas shows normal intrinsic T1 signal. There is no ductal dilation of the dorsal pancreatic duct. There is extensive interstitial edematous change throughout the peripheral remaining pancreas and signs of peripancreatic stranding tracking into the root of the mesentery and the transverse mesocolon. Spleen: Spleen without focal, suspicious lesion. 13 cm greatest craniocaudal dimension. Adrenals/Urinary Tract: Adrenal glands are normal. No perinephric stranding. No hydronephrosis. Smooth renal contours. No suspicious renal lesion. Stomach/Bowel: No acute gastrointestinal process aside from presumed secondary bowel edema in the setting of severe acute interstitial edematous pancreatitis and signs of necrotic pancreatitis as above. Vascular/Lymphatic: Hypertrophied GDA pathways bridging inferior pancreaticoduodenal collaterals to the SMA in the absence of stenosis of the SMA. There is some narrowing of the celiac that is  suggested on prior CT imaging likely due to median arcuate ligament impression, this is likely a collateral network as result of this abnormality. Patent portal vein, splenic vein and SMV. No adenopathy Other: Trace ascites. Musculoskeletal: No suspicious bone lesions identified. IMPRESSION: 1. Hemorrhagic area of walled off necrosis is large and replaces head and uncinate process of the pancreas and is intimately associated with hypertrophied collateral pathways between the SMA and celiac. 2. Celiac flow may be dependent upon this pathway and this collateral network could be at risk of bleeding given proximity to the large area of walled off necrosis. Close attention on follow-up is suggested. 3. Given that the collateral pathway may supply of much of the celiac  flow, this is of importance should there be a need for future intervention. 4. Severe acute interstitial edematous pancreatitis involving the pancreatic body and tail. No ductal dilation. Note that given the proximity of the necrotic collection there is a risk for ductal interruption and the duct cannot be clearly tracked through the neck of the pancreas to the ampulla. 5. Extrinsic compression upon the common bile duct and signs of tumefactive sludge or stones in the gallbladder lumen. 6. Marked hepatic steatosis. Variable perfusion/enhancement of the liver could indicate developing liver disease or be related to above findings. No suspicious hepatic lesion. Trace ascites. 7. Mild splenomegaly. A call is out to the referring provider to further discuss findings in the above case. Electronically Signed: By: Donzetta Kohut M.D. On: 03/29/2021 12:29     Scheduled Meds:   . folic acid  1 mg Oral Daily  . multivitamin with minerals  1 tablet Oral Daily  . pantoprazole  40 mg Oral Daily  . thiamine  100 mg Oral Daily  . vitamin B-12  1,000 mcg Oral Daily    Continuous Infusions:   . lactated ringers 150 mL/hr at 03/30/21 1240  . piperacillin-tazobactam (ZOSYN)  IV 3.375 g (03/30/21 1335)     LOS: 1 day     Marcellus Scott, MD, [place], [facility]. [facility]    To contact the attending provider between 7A-7P or the covering provider during after hours 7P-7A, please log into the web site [email or web address] and access using universal  password for that web site. If you do not have the password, please call the hospital operator.  03/30/2021, 5:00 PM

## 2021-03-30 NOTE — Consult Note (Addendum)
Gastroenterology Inpatient Consultation   Attending Requesting Consult Modena Jansky, [place] [facility] Day: 2  Reason for Consult Pancreatitis and concern for Hemorrhagic Necrosis and Abnormal Imaging    History of Present Illness  Sandra Pearson is a 35 y.o. female with a pmh significant for hypertension, iron deficiency anemia (etiology unclear though long and heavy menses), alcohol use disorder, previous pancreatitis (thought alcohol related in 2020), now recurrent idiopathic pancreatitis, H. pylori infection (eradication unclear as is undergoing current treatment), GERD.  The GI service is consulted for evaluation and management of pancreatitis and concern for hemorrhagic necrosis and abnormal vasculature surrounding the pancreas and celiac vessels.  The patient is transferred from [facility] in the setting of presenting with acute onset pain on Thursday night into Friday.  Patient describes not having this type of pain previously.  However, documentation in the chart suggest that she has had pancreatitis with lipase elevation greater than 500 back in 2020 with what was thought to be alcohol related pancreatitis.  Patient underwent imaging with a CT and subsequently MRI that showed significant findings of necrosis concerning for hemorrhage and significant abnormal vasculature surrounding the pancreas and celiac regions.  Because of concern of potential hemorrhagic necrosis and concern for aneurysmal changes and thus there is concern for higher level of care being needed she was transferred.  She was recently admitted to the hospital with anemia underwent an upper and lower endoscopy was found to have H. pylori.  She was initiated on amoxicillin/clarithromycin/pantoprazole for treatment but all of these are all new medications.  Patient felt as she was taking these medications altogether that she did have some upsetting her stomach but it was not to the level of  severe sharp stabbing pain that she developed on Thursday evening.  Patient describes alcohol consumption every 2 to 3 days for few days in a row but no more than 2-3 drinks at a time.  She has not drank on a daily basis ever.  This is confirmed by the patient's husband as well as patient's sister.  Patient does not take significant nonsteroidals or BC/Goody powders.  On evaluation today states that her abdominal pain is much better.  She has an appetite and would like to try and eat if it is felt to be safe.  She is on antibiotics for concern of necrosis.  GI Review of Systems Positive as above Negative for dysphagia, odynophagia, vomiting, change in bowel habits, melena, medic easier   Review of Systems  General: Denies fevers/chills/weight loss/night sweats HEENT: Denies oral lesions/sore throat/headaches/visual changes Cardiovascular: Denies chest pain/palpitations Pulmonary: Denies shortness of breath/cough Gastroenterological: See HPI Genitourinary: Denies darkened urine or hematuria Hematological: Denies easy bruising/bleeding Endocrine: Denies temperature intolerance Dermatological: Denies skin changes Psychological: Mood is stable Allergy & Immunology: Denies severe allergic reactions Musculoskeletal: Denies new arthralgias   Histories  Past Medical History Past Medical History:  Diagnosis Date  . Anemia   . Hypertension    Past Surgical History:  Procedure Laterality Date  . COLONOSCOPY N/A 03/15/2021   Procedure: COLONOSCOPY;  Surgeon: Virgel Manifold, MD;  Location: [facility] ENDOSCOPY;  Service: Endoscopy;  Laterality: N/A;  . ESOPHAGOGASTRODUODENOSCOPY N/A 03/15/2021   Procedure: ESOPHAGOGASTRODUODENOSCOPY (EGD);  Surgeon: Virgel Manifold, MD;  Location: [facility] ENDOSCOPY;  Service: Endoscopy;  Laterality: N/A;  . MANDIBLE FRACTURE SURGERY      Allergies No Known Allergies  Family History Family History  Problem Relation Age of Onset  . Hepatitis C  Mother    . Heart disease Mother    The patient's FH is negative for IBD/IBS/Liver Disease/GI Malignancies.  Social History Social History   Socioeconomic History  . Marital status: Married    Spouse name: Not on file  . Number of children: Not on file  . Years of education: Not on file  . Highest education level: Not on file  Occupational History  . Not on file  Tobacco Use  . Smoking status: Current Every Day Smoker    Packs/day: 1.00    Types: Cigarettes  . Smokeless tobacco: Never Used  Vaping Use  . Vaping Use: Some days  Substance and Sexual Activity  . Alcohol use: No    Comment: rarely   . Drug use: No  . Sexual activity: Yes  Other Topics Concern  . Not on file  Social History Narrative  . Not on file   Social Determinants of Health   Financial Resource Strain: Not on file  Food Insecurity: Not on file  Transportation Needs: Not on file  Physical Activity: Not on file  Stress: Not on file  Social Connections: Not on file  Intimate Partner Violence: Not on file   The patient denies Drug, Alcohol, or Tobacco use.   Occupation is Travel   Medications  Home Medications No current facility-administered medications on file prior to encounter.   Current Outpatient Medications on File Prior to Encounter  Medication Sig Dispense Refill  . amoxicillin (AMOXIL) 500 MG tablet Take 2 tablets (1,000 mg total) by mouth 2 (two) times daily for 14 days. 56 tablet 0  . clarithromycin (BIAXIN) 500 MG tablet Take 1 tablet (500 mg total) by mouth 2 (two) times daily for 14 days. 28 tablet 0  . ferrous sulfate 325 (65 FE) MG tablet Take 1 tablet (325 mg total) by mouth daily. 30 tablet 0  . folic acid (FOLVITE) 1 MG tablet Take 1 tablet (1 mg total) by mouth daily. 30 tablet 2  . Multiple Vitamins-Minerals (WOMENS 50+ MULTI VITAMIN/MIN PO) Take 1 tablet by mouth daily.    . pantoprazole (PROTONIX) 20 MG tablet Take 1 tablet (20 mg total) by mouth in the morning and at bedtime for  14 days. 28 tablet 0  . vitamin B-12 (CYANOCOBALAMIN) 1000 MCG tablet Take 1 tablet (1,000 mcg total) by mouth daily. 30 tablet 0  . gabapentin (NEURONTIN) 100 MG capsule Take 1 capsule (100 mg total) by mouth 3 (three) times daily as needed. (Patient not taking: Reported on 03/30/2021) 30 capsule 3  . ondansetron (ZOFRAN) 4 MG tablet Take 1 tablet (4 mg total) by mouth every 8 (eight) hours as needed for nausea or vomiting. (Patient not taking: Reported on 03/30/2021) 20 tablet 1  . oxyCODONE-acetaminophen (PERCOCET/ROXICET) 5-325 MG tablet Take 1 tablet by mouth every 4 (four) hours as needed for severe pain. (Patient not taking: No sig reported) 15 tablet 0  . promethazine (PHENERGAN) 25 MG tablet Take 1 tablet (25 mg total) by mouth every 6 (six) hours as needed for nausea or vomiting. (Patient not taking: No sig reported) 20 tablet 0  . traMADol (ULTRAM) 50 MG tablet Take 1 tablet (50 mg total) by mouth every 6 (six) hours as needed. (Patient not taking: No sig reported) 20 tablet 0   Scheduled Inpatient Medications . folic acid  1 mg Oral Daily  . multivitamin with minerals  1 tablet Oral Daily  . pantoprazole  40 mg Oral Daily  . thiamine  100 mg  Oral Daily  . vitamin B-12  1,000 mcg Oral Daily   Continuous Inpatient Infusions . lactated ringers 150 mL/hr at 03/30/21 1240  . piperacillin-tazobactam (ZOSYN)  IV 3.375 g (03/30/21 0553)   PRN Inpatient Medications diphenhydrAMINE, HYDROmorphone (DILAUDID) injection, melatonin, ondansetron (ZOFRAN) IV, senna-docusate   Physical Examination  BP 124/77 (BP Location: Right Arm)   Temp 98.5 F (36.9 C) (Oral)   Resp 18   Wt 62.1 kg   SpO2 100%   BMI 20.22 kg/m  GEN: Looks surprisingly well and currently in minimal distress, patient's husband and sister at bedside, nontoxic PSYCH: Cooperative, without pressured speech EYE: Conjunctivae pink, sclerae anicteric ENT: Dry mucous membranes CV: Nontachycardic RESP: No audible wheezing GI:  Hypoactive though present bowel sounds, protuberant abdomen, tenderness to palpation throughout the upper abdomen upon soft and deep touch, volitional guarding is present, no rebound, did not appreciate hepatosplenomegaly MSK/EXT: No lower extremity edema SKIN: Pale but no evidence of jaundice  NEURO:  Alert & Oriented x 3, no focal deficits   Review of Data  I reviewed the following data at the time of this encounter:  Laboratory Studies   Recent Labs  Lab 03/29/21 0023 03/29/21 0246 03/30/21 0547  NA 138  --  135  K 3.8  --  3.6  CL 106  --  102  CO2 20*  --  28  BUN <5*  --  <5*  CREATININE 0.52  --  0.59  GLUCOSE 184*  --  100*  CALCIUM 9.4  --  8.2*  MG  --    < > 2.1  PHOS 4.1  --  4.0   < > = values in this interval not displayed.   Recent Labs  Lab 03/30/21 0547  AST 43*  ALT 28  ALKPHOS 114    Recent Labs  Lab 03/29/21 0023 03/30/21 0050 03/30/21 0547  WBC 8.0  --  5.6  HGB 13.6   < > 10.4*  HCT 44.6   < > 35.3*  PLT 307  --  152   < > = values in this interval not displayed.   Recent Labs  Lab 03/29/21 1358  APTT 29  INR 1.1   Imaging Studies  January 2020 CT abdomen pelvis with contrast IMPRESSION: No evidence of pulmonary embolism. Acute pancreatitis without acute complication. Decreased size of RIGHT ovarian cyst.  No  March 29, 2021 CT abdomen pelvis with contrast IMPRESSION: 1. Extensive inflammation surrounding the pancreas, indicative of acute pancreatitis. Associated with this there is a large lesion in the head of the pancreas which is of mixed attenuation. The appearance of this is favored to represent a pseudocyst, although the possibility of internal hemorrhage within the pseudocyst is suspected, particularly given the markedly dilated vessels in the surrounding pancreaticoduodenal arcade. Alternatively, a hemorrhagic mass is difficult to exclude, but is not strongly favored given the patient's young age. This could be further  characterized with abdominal MRI with and without IV gadolinium with MRCP. 2. Cholelithiasis without evidence of acute cholecystitis at this time. 3. Hepatomegaly with severe hepatic steatosis. 4. Splenomegaly.  March 29, 2021 MRI/MRCP IMPRESSION: 1. Hemorrhagic area of walled off necrosis is large and replaces head and uncinate process of the pancreas and is intimately associated with hypertrophied collateral pathways between the SMA and celiac. 2. Celiac flow may be dependent upon this pathway and this collateral network could be at risk of bleeding given proximity to the large area of walled off necrosis. Close attention on  follow-up is suggested. 3. Given that the collateral pathway may supply of much of the celiac flow, this is of importance should there be a need for future intervention. 4. Severe acute interstitial edematous pancreatitis involving the pancreatic body and tail. No ductal dilation. Note that given the proximity of the necrotic collection there is a risk for ductal interruption and the duct cannot be clearly tracked through the neck of the pancreas to the ampulla. 5. Extrinsic compression upon the common bile duct and signs of tumefactive sludge or stones in the gallbladder lumen. 6. Marked hepatic steatosis. Variable perfusion/enhancement of the liver could indicate developing liver disease or be related to above findings. No suspicious hepatic lesion. Trace ascites. 7. Mild splenomegaly.   GI Procedures and Studies  No relevant procedures to review   Assessment  Ms. Sandra Pearson is a 35 y.o. female with a pmh significant for hypertension, iron deficiency anemia (etiology unclear though long and heavy menses), alcohol use disorder, previous pancreatitis (thought alcohol related in 2020), now recurrent idiopathic pancreatitis, H. pylori infection (eradication unclear as is undergoing current treatment), GERD.  The GI service is consulted for evaluation and management of  pancreatitis and concern for hemorrhagic necrosis and abnormal vasculature surrounding the pancreas and celiac vessels.  The patient is hemodynamically stable.  Clinically, comparatively to her imaging she looks exceedingly well for which I am surprised.  The etiology of her pancreatitis remains unclear although she has a documented episode of what seemed to be alcoholic pancreatitis in 0109, the amount of alcohol that she and her family states she is taking seems out of proportion to what would normally be an etiology for acute recurrent pancreatitis.  This does not take it out of the picture however.  The patient was initiated on H. pylori treatment recently and she is taking Biaxin (clarithromycin) which is a class III medication that can cause pancreatitis.  Is also on pantoprazole which is a class IV medication that has been associated with pancreatitis in case reports.  The rest of her medications are not clearly defined as being increased risk for drug-induced pancreatitis.  At this point, the most important issue is trying to support her through this severe episode of pancreatitis with what looks to be necrosis developing as well as hemorrhage and significant abnormalities within the vasculature in the abdominal cavity.  Appreciate general surgery and vascular surgery evaluating the patient though based on the way she looks I do not suspect that any intervention will be performed at this time.  The fluid collection looks to be in the process of maturation which is quite interesting since she did not have this type of pain previously until Thursday so 1 would query whether there is any chance this could have been a chronic cyst that subsequently developed from her prior episode in 2020 she has not had any imaging since then or if this is just a burgeoning pancreatic fluid collection.  I feel hesitant to perform a cyst gastrostomy or cyst aspiration at this time especially with the significant amount of  blood vasculature that is around the pancreas at this time.  I think we will try to support her and follow-up should be at one of the quaternary centers due to the increased risk of complications of cyst aspiration or bleeding that could occur based on her significant the vasculature has been made abnormal due to the pancreatitis.  For now I will allow her to be on a clear liquid diet and advance to full  liquid diet by the end of today if she is doing well.  If she is doing well by tomorrow she can be advanced to a low-fat diet.  If her nutrition does not improve by the end of this weekend and early next week then she will need a core track for nutritional support.  I have discussed the reasoning for this with the patient and her family.  I will send some laboratories to further evaluate potential cause of pancreatitis though the chance of alcoholic pancreatitis still remains.  We will rule out autoimmune pancreatitis.  I would hold on reinitiation of H. pylori antibiotics just with the clarithromycin risk for pancreatitis since drug-induced pancreatitis is most often within the first few weeks of a new drug be initiated so we will hold off on repeat H. pylori treatment for now or consider another medication after she has hopefully stabilized from her pancreatitis.  She will need repeat imaging at some point depending on how she does.  If she remains in the hospital and is not progressing then repeat imaging within a week seems reasonable otherwise she should have repeat imaging within a few weeks of discharge if she heals quickly.  Any follow-up based on the complexity of her case should be done at one of the quaternary centers [facility]).  All patient and family questions were answered to the best of my ability, and the patient agrees to the aforementioned plan of action with follow-up as indicated.   Plan/Recommendations  Adding on ESR/CRP to labs today to see how significant inflammatory  markers are now and will recheck in 2 days IgG4 to be checked Clear liquid diet and advance to full liquid diet over the course of next 24 hours and if does well may advance diet further - If fails then core track placement on Sunday or Monday should be considered Patient should be up and out of bed since with hemorrhagic necrosis VTE prophylaxis is somewhat contraindicated If patient stagnates in regards to healing then consider repeat early imaging with CT abdomen Serial hemoglobins every 8 hours seems reasonable for next 24 hours and if stable then every 12 hours If dramatic development of hypotension or significant transfusion dependent anemia develops patient will need repeat angiography of the abdomen Supportive measures for now Follow-up should be made at one of the quaternary centers [facility]) Appreciate Surgery and Vascular Surgery input Pain control per Medicine Antibiotics reasonable for now   Thank you for this consult.  Dr. Henrene Pastor will be in charge of the [place] [facility] service over the rest of the weekend.  Please page/call with questions or concerns.   Justice Britain, MD [facility] Office # [phone]   Total Time in Face-to-Face and in Coordination of Care for patient including independent/personal interpretation/review of prior testing, medical history, examination, medication adjustment, communicating results with the patient directly, and documentation with the EHR is greater than 110 minutes based on evaluation of CT scan, MRI, discussion with patient last night and this morning with different services.

## 2021-03-30 NOTE — Progress Notes (Signed)
Pharmacy Antibiotic Note  Sandra Pearson is a 36 y.o. female admitted on 03/29/2021 with pancreatitis with associated necrosis/hemorrhage .  Pharmacy has been consulted for Zosyn dosing. WBC WNL. Renal function good.  Plan: Zosyn 3.375G IV q8h to be infused over 4 hours Trend WBC, temp, renal function  F/U infectious work-up  Temp (24hrs), Avg:98.2 F (36.8 C), Min:97.6 F (36.4 C), Max:98.7 F (37.1 C)  Recent Labs  Lab 03/29/21 0023  WBC 8.0  CREATININE 0.52    Estimated Creatinine Clearance: 89.5 mL/min (by C-G formula based on SCr of 0.52 mg/dL).    No Known Allergies  Abran Duke, PharmD, BCPS Clinical Pharmacist Phone: [phone]

## 2021-03-31 DIAGNOSIS — R935 Abnormal findings on diagnostic imaging of other abdominal regions, including retroperitoneum: Secondary | ICD-10-CM

## 2021-03-31 DIAGNOSIS — R1013 Epigastric pain: Secondary | ICD-10-CM

## 2021-03-31 LAB — COMPREHENSIVE METABOLIC PANEL
ALT: 27 U/L (ref 0–44)
AST: 44 U/L — ABNORMAL HIGH (ref 15–41)
Albumin: 2.2 g/dL — ABNORMAL LOW (ref 3.5–5.0)
Alkaline Phosphatase: 105 U/L (ref 38–126)
Anion gap: 5 (ref 5–15)
BUN: <5 mg/dL — ABNORMAL LOW (ref 6–20)
CO2: 25 mmol/L (ref 22–32)
Calcium: 8.4 mg/dL — ABNORMAL LOW (ref 8.9–10.3)
Chloride: 105 mmol/L (ref 98–111)
Creatinine, Ser: 0.51 mg/dL (ref 0.44–1.00)
GFR, Estimated: >60 mL/min (ref >60)
Glucose, Bld: 86 mg/dL (ref 70–99)
Potassium: 3.9 mmol/L (ref 3.5–5.1)
Sodium: 135 mmol/L (ref 135–145)
Total Bilirubin: 0.7 mg/dL (ref 0.3–1.2)
Total Protein: 5.3 g/dL — ABNORMAL LOW (ref 6.5–8.1)

## 2021-03-31 LAB — CBC
HCT: 30.7 % — ABNORMAL LOW (ref 36.0–46.0)
Hemoglobin: 9 g/dL — ABNORMAL LOW (ref 12.0–15.0)
MCH: 26.6 pg (ref 26.0–34.0)
MCHC: 29.3 g/dL — ABNORMAL LOW (ref 30.0–36.0)
MCV: 90.8 fL (ref 80.0–100.0)
Platelets: 105 10*3/uL — ABNORMAL LOW (ref 150–400)
RBC: 3.38 MIL/uL — ABNORMAL LOW (ref 3.87–5.11)
RDW: 22.7 % — ABNORMAL HIGH (ref 11.5–15.5)
WBC: 4 10*3/uL (ref 4.0–10.5)
nRBC: 0 % (ref 0.0–0.2)

## 2021-03-31 LAB — PHOSPHORUS: Phosphorus: 4.6 mg/dL (ref 2.5–4.6)

## 2021-03-31 LAB — GLUCOSE, CAPILLARY
Glucose-Capillary: 150 mg/dL — ABNORMAL HIGH (ref 70–99)
Glucose-Capillary: 81 mg/dL (ref 70–99)
Glucose-Capillary: 86 mg/dL (ref 70–99)
Glucose-Capillary: 86 mg/dL (ref 70–99)
Glucose-Capillary: 88 mg/dL (ref 70–99)
Glucose-Capillary: 96 mg/dL (ref 70–99)

## 2021-03-31 LAB — LIPASE, BLOOD: Lipase: 59 U/L — ABNORMAL HIGH (ref 11–51)

## 2021-03-31 LAB — HEMOGLOBIN AND HEMATOCRIT, BLOOD
HCT: 31.7 % — ABNORMAL LOW (ref 36.0–46.0)
HCT: 32.8 % — ABNORMAL LOW (ref 36.0–46.0)
Hemoglobin: 9 g/dL — ABNORMAL LOW (ref 12.0–15.0)
Hemoglobin: 9.6 g/dL — ABNORMAL LOW (ref 12.0–15.0)

## 2021-03-31 LAB — MAGNESIUM: Magnesium: 1.7 mg/dL (ref 1.7–2.4)

## 2021-03-31 MED ORDER — MAGNESIUM SULFATE 2 GM/50ML IV SOLN
2.0000 g | Freq: Once | INTRAVENOUS | Status: AC
  Administered 2021-03-31: 2 g via INTRAVENOUS
  Filled 2021-03-31: qty 50

## 2021-03-31 NOTE — Progress Notes (Signed)
PROGRESS NOTE   Sandra Pearson  CHE:[id number]    DOB: Aug 17, 1986    DOA: 03/29/2021  PCP: Patient, No Pcp Per (Inactive)   I have briefly reviewed patients previous medical records in [facility].  Chief complaint: Abdominal pain  Brief Narrative:  35 year old female with medical history significant for but not limited to GERD with esophagitis, hiatal hernia, complex ovarian cyst, multifactorial anemia (iron and B12 deficiency), H. pylori infection, tobacco and alcohol use disorder, alcohol induced acute pancreatitis in 2020, HTN recent hospitalization 5/17- 5/20 due to severe anemia felt to be due to malabsorption, underwent upper and lower endoscopy, found to have H. pylori, initiated on triple therapy, treated with IV iron and B12 shots, initially admitted at [facility] due to abdominal pain with a diagnosis of severe pancreatitis with hemorrhagic necrosis and abnormal vasculature surrounding the pancreas and celiac vessels.  Vascular surgery and general surgery consulted at [facility] and recommended transfer to higher level of care and patient was transferred to [facility] and general surgery have evaluated here.  Treating supportively.   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  Other differential diagnoses they offer: Drug-induced (recently started clarithromycin for H. pylori, pantoprazole), autoimmune versus others.?  Biliary etiology.  Hemodynamically stable.  Starting on clear liquids, advancing to full liquids today and if does well then soft diet tomorrow.  If unable to tolerate diet then may need NG tube feeds.  IV fluids, IV Zosyn and pain management.  Hold chemical DVT  prophylaxis due to bleeding risks.  Monitor CBCs closely, stable for now.  If becomes unstable, will need repeat CTA of the abdomen.  When stable, GI recommends follow-up should be at one of the quaternary centers [facility]).  Per general surgery, there is not a good surgical option and no need for emergent surgery at this point.  Per vascular surgery evaluation at [facility], did not recommend vascular intervention at this time, as this will be extremely difficult to embolize successfully due to large number of blood vessels feeding the area, very large size of the blood vessels and the multiple different vessels that will have to be embolized.  Remained stable with no abdominal pain, tolerating clear liquids this morning, stable hemoglobin.  Advancing diet to full liquids at lunch and low-fat diet for dinner as tolerated.  ESR normal, CRP mildly elevated.  Per GI follow-up, IgG4 levels pending, repeat CT scan in 2 weeks as outpatient with her regular GI/Dr. Bonna Gains, ultimately needs follow-up at a quaternary center and no current role for GI intervention with pancreatic drainage.  If tolerates diet, possible discharge tomorrow, GI to advise oral antibiotic option and duration of treatment if possible.  Recent H pylori infection: Per GI, hold off on reinitiation of H. pylori antibiotics due to clarithromycin related risk of pancreatitis.  Could consider alternate regimen once stabilized.  Multifactorial anemia (iron and B12 deficiency): S/p recent EGD and colonoscopy.  Hemoglobin stable in the 9 g range for several readings.  Stop frequent CBC checks and will check in a.m and transfuse if hemoglobin 7 g or less.  Moreover if her hemoglobin significantly drops, will need CTA abdomen as noted above.  Continue B12 supplements.  Hold iron  supplements for now.  Continue PPI.  Thrombocytopenia: Unclear etiology.?  Related to alcohol.  Follow CBC in a.m.  Hypomagnesemia: Replaced.  Abnormal LFTs: Mild.   May be due to fatty liver noted on imaging or alcohol related.  Trend CMP.  Alcohol use disorder: Absolute alcohol cessation has been counseled by multiple providers.  No overt withdrawal.  CIWA protocol.  Bilateral feet pain: May be peripheral neuropathy related to alcohol, B12 deficiency versus others.  Continue thiamine, folate and B12 supplements.  Also topical moisturizing lotion since feet appear dry.  GERD with esophagitis: PPI.  Body mass index is 20.22 kg/m.    DVT prophylaxis: SCDs Start: 03/29/21 2214     Code Status: Full Code Family Communication: None at bedside. Disposition:  Status is: Inpatient  Remains inpatient appropriate because:Inpatient level of care appropriate due to severity of illness   Dispo:  Patient From: Home  Planned Disposition: Home  Medically stable for discharge: No          Consultants:   [facility] General surgery  Procedures:   None  Antimicrobials:    Anti-infectives (From admission, onward)   Start     Dose/Rate Route Frequency Ordered Stop   03/30/21 0100  piperacillin-tazobactam (ZOSYN) IVPB 3.375 g        3.375 g 12.5 mL/hr over 240 Minutes Intravenous Every 8 hours 03/30/21 0012          Subjective:  Patient seen along with her female RN in room.  No abdominal pain.  Tolerating liquid diet.  Had BM yesterday.  Feet pain better.  Objective:   Vitals:   03/30/21 1945 03/31/21 0446 03/31/21 1045 03/31/21 1510  BP: 124/76 133/85 108/82 128/78  Pulse: 70 72 67 60  Resp: '18 18 17 18  ' Temp: 98.6 F (37 C) 100 F (37.8 C) 97.8 F (36.6 C) 97.7 F (36.5 C)  TempSrc: Oral Oral Oral Oral  SpO2: 100% 97% 99% 100%  Weight:        General exam: Young female, moderately built and thinly nourished, lying comfortably supine in bed without distress.  Does not look septic or toxic. Respiratory system: Clear to auscultation.  No increased work of breathing. Cardiovascular system: S1 and S2 heard, RRR.  No JVD, murmurs  or pedal edema.  Telemetry personally reviewed: Sinus rhythm. Gastrointestinal system: Abdomen remains nondistended, soft and nontender.  Normal bowel sounds heard. Central nervous system: Alert and oriented. No focal neurological deficits. Extremities: Symmetric 5 x 5 power. Skin: Bilateral feet with dry skin, minimal scaling, sensitive to touch, no features suggestive of cellulitis, good bilateral dorsalis pedis pulses appreciated. Psychiatry: Judgement and insight appear normal. Mood & affect appropriate.     Data Reviewed:   I have personally reviewed following labs and imaging studies   CBC: Recent Labs  Lab 03/29/21 0023 03/30/21 0050 03/30/21 0547 03/30/21 1347 03/31/21 0045 03/31/21 0657 03/31/21 1253  WBC 8.0  --  5.6  --   --  4.0  --   HGB 13.6   < > 10.4*   < > 9.0* 9.0* 9.6*  HCT 44.6   < > 35.3*   < > 31.7* 30.7* 32.8*  MCV 85.9  --  89.6  --   --  90.8  --   PLT 307  --  152  --   --  105*  --    < > = values in this interval not displayed.    Basic Metabolic Panel: Recent Labs  Lab  03/29/21 0023 03/29/21 0246 03/30/21 0547 03/31/21 0657  NA 138  --  135 135  K 3.8  --  3.6 3.9  CL 106  --  102 105  CO2 20*  --  28 25  GLUCOSE 184*  --  100* 86  BUN <5*  --  <5* <5*  CREATININE 0.52  --  0.59 0.51  CALCIUM 9.4  --  8.2* 8.4*  MG  --  1.5* 2.1 1.7  PHOS 4.1  --  4.0 4.6    Liver Function Tests: Recent Labs  Lab 03/29/21 0246 03/30/21 0547 03/31/21 0657  AST 67* 43* 44*  ALT 43 28 27  ALKPHOS 161* 114 105  BILITOT 1.2 1.2 0.7  PROT 7.3 5.4* 5.3*  ALBUMIN 3.3* 2.3* 2.2*    CBG: Recent Labs  Lab 03/31/21 0442 03/31/21 0757 03/31/21 1129  GLUCAP 86 88 96    Microbiology Studies:   Recent Results (from the past 240 hour(s))  Resp Panel by RT-PCR (Flu A&B, Covid) Nasopharyngeal Swab     Status: None   Collection Time: 03/29/21  7:31 AM   Specimen: Nasopharyngeal Swab; Nasopharyngeal(NP) swabs in vial transport medium  Result Value  Ref Range Status   SARS Coronavirus 2 by RT PCR NEGATIVE NEGATIVE Final    Comment: (NOTE) SARS-CoV-2 target nucleic acids are NOT DETECTED.  The SARS-CoV-2 RNA is generally detectable in upper respiratory specimens during the acute phase of infection. The lowest concentration of SARS-CoV-2 viral copies this assay can detect is 138 copies/mL. A negative result does not preclude SARS-Cov-2 infection and should not be used as the sole basis for treatment or other patient management decisions. A negative result may occur with  improper specimen collection/handling, submission of specimen other than nasopharyngeal swab, presence of viral mutation(s) within the areas targeted by this assay, and inadequate number of viral copies(<138 copies/mL). A negative result must be combined with clinical observations, patient history, and epidemiological information. The expected result is Negative.  Fact Sheet for Patients:  EntrepreneurPulse.com.au  Fact Sheet for Healthcare Providers:  IncredibleEmployment.be  This test is no t yet approved or cleared by the [place] FDA and  has been authorized for detection and/or diagnosis of SARS-CoV-2 by FDA under an Emergency Use Authorization (EUA). This EUA will remain  in effect (meaning this test can be used) for the duration of the COVID-19 declaration under Section 564(b)(1) of the Act, 21 U.S.C.section 360bbb-3(b)(1), unless the authorization is terminated  or revoked sooner.       Influenza A by PCR NEGATIVE NEGATIVE Final   Influenza B by PCR NEGATIVE NEGATIVE Final    Comment: (NOTE) The Xpert Xpress SARS-CoV-2/FLU/RSV plus assay is intended as an aid in the diagnosis of influenza from Nasopharyngeal swab specimens and should not be used as a sole basis for treatment. Nasal washings and aspirates are unacceptable for Xpert Xpress SARS-CoV-2/FLU/RSV testing.  Fact Sheet for  Patients: EntrepreneurPulse.com.au  Fact Sheet for Healthcare Providers: IncredibleEmployment.be  This test is not yet approved or cleared by the [place] FDA and has been authorized for detection and/or diagnosis of SARS-CoV-2 by FDA under an Emergency Use Authorization (EUA). This EUA will remain in effect (meaning this test can be used) for the duration of the COVID-19 declaration under Section 564(b)(1) of the Act, 21 U.S.C. section 360bbb-3(b)(1), unless the authorization is terminated or revoked.  Performed at [facility], [address] [place], [place] [address]   Resp Panel by RT-PCR (Flu A&B,  Covid) Nasopharyngeal Swab     Status: None   Collection Time: 03/29/21  9:49 PM   Specimen: Nasopharyngeal Swab; Nasopharyngeal(NP) swabs in vial transport medium  Result Value Ref Range Status   SARS Coronavirus 2 by RT PCR NEGATIVE NEGATIVE Final    Comment: (NOTE) SARS-CoV-2 target nucleic acids are NOT DETECTED.  The SARS-CoV-2 RNA is generally detectable in upper respiratory specimens during the acute phase of infection. The lowest concentration of SARS-CoV-2 viral copies this assay can detect is 138 copies/mL. A negative result does not preclude SARS-Cov-2 infection and should not be used as the sole basis for treatment or other patient management decisions. A negative result may occur with  improper specimen collection/handling, submission of specimen other than nasopharyngeal swab, presence of viral mutation(s) within the areas targeted by this assay, and inadequate number of viral copies(<138 copies/mL). A negative result must be combined with clinical observations, patient history, and epidemiological information. The expected result is Negative.  Fact Sheet for Patients:  EntrepreneurPulse.com.au  Fact Sheet for Healthcare Providers:  IncredibleEmployment.be  This test is no t  yet approved or cleared by the [place] FDA and  has been authorized for detection and/or diagnosis of SARS-CoV-2 by FDA under an Emergency Use Authorization (EUA). This EUA will remain  in effect (meaning this test can be used) for the duration of the COVID-19 declaration under Section 564(b)(1) of the Act, 21 U.S.C.section 360bbb-3(b)(1), unless the authorization is terminated  or revoked sooner.       Influenza A by PCR NEGATIVE NEGATIVE Final   Influenza B by PCR NEGATIVE NEGATIVE Final    Comment: (NOTE) The Xpert Xpress SARS-CoV-2/FLU/RSV plus assay is intended as an aid in the diagnosis of influenza from Nasopharyngeal swab specimens and should not be used as a sole basis for treatment. Nasal washings and aspirates are unacceptable for Xpert Xpress SARS-CoV-2/FLU/RSV testing.  Fact Sheet for Patients: EntrepreneurPulse.com.au  Fact Sheet for Healthcare Providers: IncredibleEmployment.be  This test is not yet approved or cleared by the [place] FDA and has been authorized for detection and/or diagnosis of SARS-CoV-2 by FDA under an Emergency Use Authorization (EUA). This EUA will remain in effect (meaning this test can be used) for the duration of the COVID-19 declaration under Section 564(b)(1) of the Act, 21 U.S.C. section 360bbb-3(b)(1), unless the authorization is terminated or revoked.  Performed at [facility], [place] [address], [place], [place] [address]      Radiology Studies:  No results found.   Scheduled Meds:   . folic acid  1 mg Oral Daily  . multivitamin with minerals  1 tablet Oral Daily  . pantoprazole  40 mg Oral Daily  . senna-docusate  1 tablet Oral QHS  . thiamine  100 mg Oral Daily  . vitamin B-12  1,000 mcg Oral Daily    Continuous Infusions:   . lactated ringers 150 mL/hr at 03/31/21 0150  . piperacillin-tazobactam (ZOSYN)  IV 3.375 g (03/31/21 1307)     LOS: 2 days     Vernell Leep, MD, [place], [facility]. [facility]    To contact the attending provider between 7A-7P or the covering provider during after hours 7P-7A, please log into the web site [email or web address] and access using universal [place] password for that web site. If you do not have the password, please call the hospital operator.  03/31/2021, 4:16 PM

## 2021-03-31 NOTE — Progress Notes (Addendum)
[facility] Progress Note  CC:  Severe acute hemorrhagic pancreatitis  Subjective:  Feels good.  Has not tried full liquids yet because she did not realize that the diet was advanced.  She is tolerating clear liquids well, however.    Objective:  Vital signs in last 24 hours: Temp:  [98.6 F (37 C)-100 F (37.8 C)] 100 F (37.8 C) (06/05 0446) Pulse Rate:  [70-74] 72 (06/05 0446) Resp:  [18] 18 (06/05 0446) BP: (118-133)/(76-85) 133/85 (06/05 0446) SpO2:  [97 %-100 %] 97 % (06/05 0446) Last BM Date: 03/30/21 General:  Alert, Well-developed, in NAD Heart:  Regular rate and rhythm; no murmurs Pulm:  CTAB.  No W/R/R. Abdomen:  Soft, non-distended.  BS present.  Minimal to no TTP. Extremities:  Without edema. Neurologic:  Alert and oriented x 4;  grossly normal neurologically. Psych:  Alert and cooperative. Normal mood and affect.  Intake/Output from previous day: 06/04 0701 - 06/05 0700 In: 2715.7 [I.V.:1930.3; IV Piggyback:150.1] Out: -   Lab Results: Recent Labs    03/29/21 0023 03/30/21 0050 03/30/21 0547 03/30/21 1347 03/30/21 1911 03/31/21 0045 03/31/21 0657  WBC 8.0  --  5.6  --   --   --  4.0  HGB 13.6   < > 10.4*   < > 9.7* 9.0* 9.0*  HCT 44.6   < > 35.3*   < > 33.4* 31.7* 30.7*  PLT 307  --  152  --   --   --  105*   < > = values in this interval not displayed.   BMET Recent Labs    03/29/21 0023 03/30/21 0547 03/31/21 0657  NA 138 135 135  K 3.8 3.6 3.9  CL 106 102 105  CO2 20* 28 25  GLUCOSE 184* 100* 86  BUN <5* <5* <5*  CREATININE 0.52 0.59 0.51  CALCIUM 9.4 8.2* 8.4*   LFT Recent Labs    03/29/21 0246 03/30/21 0547 03/31/21 0657  PROT 7.3   < > 5.3*  ALBUMIN 3.3*   < > 2.2*  AST 67*   < > 44*  ALT 43   < > 27  ALKPHOS 161*   < > 105  BILITOT 1.2   < > 0.7  BILIDIR 0.3*  --   --   IBILI 0.9  --   --    < > = values in this interval not displayed.   PT/INR Recent Labs    03/29/21 1358  LABPROT 14.5  INR  1.1   MR ABDOMEN W WO CONTRAST  Addendum Date: 03/29/2021   ADDENDUM REPORT: 03/29/2021 12:36 ADDENDUM: These results were called by telephone at the time of interpretation on 03/29/2021 at 12:36 pm to provider Lorretta HarpXILIN NIU , who verbally acknowledged these results. Electronically Signed   By: Donzetta KohutGeoffrey  Wile M.D.   On: 03/29/2021 12:36   Result Date: 03/29/2021 CLINICAL DATA:  35 year old female with abdominal pain and pancreatitis, history of masslike area in the head of the pancreas EXAM: MRI ABDOMEN WITHOUT AND WITH CONTRAST TECHNIQUE: Multiplanar multisequence MR imaging of the abdomen was performed both before and after the administration of intravenous contrast. CONTRAST:  5mL GADAVIST GADOBUTROL 1 MMOL/ML IV SOLN COMPARISON:  CT evaluation of March 29, 2021. FINDINGS: Lower chest: Lung bases without effusion or sign of consolidative change. Hepatobiliary: Hepatic steatosis with mildly heterogeneous perfusion of the liver. Cyst in the RIGHT hepatic lobe. No focal, suspicious hepatic lesion. The portal vein remains patent despite signs  of pancreatitis. SMV is patent. Splenic vein is patent. Biliary tree with caliber change at the site of mass effect from the pancreatic lesion. Mild distension of the common bile duct up to 5 mm at this level. No signs of filling defect. Cholelithiasis or tumefactive sludge in the gallbladder. Pancreas: In the pancreatic head is a hemorrhagic collection without internal enhancement that measures 5.7 x 5.2 x 5.7 cm. No visible pancreatic tissue is noted in the head or uncinate. The neck and tail of the pancreas shows normal intrinsic T1 signal. There is no ductal dilation of the dorsal pancreatic duct. There is extensive interstitial edematous change throughout the peripheral remaining pancreas and signs of peripancreatic stranding tracking into the root of the mesentery and the transverse mesocolon. Spleen: Spleen without focal, suspicious lesion. 13 cm greatest craniocaudal  dimension. Adrenals/Urinary Tract: Adrenal glands are normal. No perinephric stranding. No hydronephrosis. Smooth renal contours. No suspicious renal lesion. Stomach/Bowel: No acute gastrointestinal process aside from presumed secondary bowel edema in the setting of severe acute interstitial edematous pancreatitis and signs of necrotic pancreatitis as above. Vascular/Lymphatic: Hypertrophied GDA pathways bridging inferior pancreaticoduodenal collaterals to the SMA in the absence of stenosis of the SMA. There is some narrowing of the celiac that is suggested on prior CT imaging likely due to median arcuate ligament impression, this is likely a collateral network as result of this abnormality. Patent portal vein, splenic vein and SMV. No adenopathy Other: Trace ascites. Musculoskeletal: No suspicious bone lesions identified. IMPRESSION: 1. Hemorrhagic area of walled off necrosis is large and replaces head and uncinate process of the pancreas and is intimately associated with hypertrophied collateral pathways between the SMA and celiac. 2. Celiac flow may be dependent upon this pathway and this collateral network could be at risk of bleeding given proximity to the large area of walled off necrosis. Close attention on follow-up is suggested. 3. Given that the collateral pathway may supply of much of the celiac flow, this is of importance should there be a need for future intervention. 4. Severe acute interstitial edematous pancreatitis involving the pancreatic body and tail. No ductal dilation. Note that given the proximity of the necrotic collection there is a risk for ductal interruption and the duct cannot be clearly tracked through the neck of the pancreas to the ampulla. 5. Extrinsic compression upon the common bile duct and signs of tumefactive sludge or stones in the gallbladder lumen. 6. Marked hepatic steatosis. Variable perfusion/enhancement of the liver could indicate developing liver disease or be related to  above findings. No suspicious hepatic lesion. Trace ascites. 7. Mild splenomegaly. A call is out to the referring provider to further discuss findings in the above case. Electronically Signed: By: Donzetta Kohut M.D. On: 03/29/2021 12:29   MR 3D Recon At Scanner  Addendum Date: 03/29/2021   ADDENDUM REPORT: 03/29/2021 12:36 ADDENDUM: These results were called by telephone at the time of interpretation on 03/29/2021 at 12:36 pm to provider Lorretta Harp , who verbally acknowledged these results. Electronically Signed   By: Donzetta Kohut M.D.   On: 03/29/2021 12:36   Result Date: 03/29/2021 CLINICAL DATA:  35 year old female with abdominal pain and pancreatitis, history of masslike area in the head of the pancreas EXAM: MRI ABDOMEN WITHOUT AND WITH CONTRAST TECHNIQUE: Multiplanar multisequence MR imaging of the abdomen was performed both before and after the administration of intravenous contrast. CONTRAST:  49mL GADAVIST GADOBUTROL 1 MMOL/ML IV SOLN COMPARISON:  CT evaluation of March 29, 2021. FINDINGS: Lower chest:  Lung bases without effusion or sign of consolidative change. Hepatobiliary: Hepatic steatosis with mildly heterogeneous perfusion of the liver. Cyst in the RIGHT hepatic lobe. No focal, suspicious hepatic lesion. The portal vein remains patent despite signs of pancreatitis. SMV is patent. Splenic vein is patent. Biliary tree with caliber change at the site of mass effect from the pancreatic lesion. Mild distension of the common bile duct up to 5 mm at this level. No signs of filling defect. Cholelithiasis or tumefactive sludge in the gallbladder. Pancreas: In the pancreatic head is a hemorrhagic collection without internal enhancement that measures 5.7 x 5.2 x 5.7 cm. No visible pancreatic tissue is noted in the head or uncinate. The neck and tail of the pancreas shows normal intrinsic T1 signal. There is no ductal dilation of the dorsal pancreatic duct. There is extensive interstitial edematous change  throughout the peripheral remaining pancreas and signs of peripancreatic stranding tracking into the root of the mesentery and the transverse mesocolon. Spleen: Spleen without focal, suspicious lesion. 13 cm greatest craniocaudal dimension. Adrenals/Urinary Tract: Adrenal glands are normal. No perinephric stranding. No hydronephrosis. Smooth renal contours. No suspicious renal lesion. Stomach/Bowel: No acute gastrointestinal process aside from presumed secondary bowel edema in the setting of severe acute interstitial edematous pancreatitis and signs of necrotic pancreatitis as above. Vascular/Lymphatic: Hypertrophied GDA pathways bridging inferior pancreaticoduodenal collaterals to the SMA in the absence of stenosis of the SMA. There is some narrowing of the celiac that is suggested on prior CT imaging likely due to median arcuate ligament impression, this is likely a collateral network as result of this abnormality. Patent portal vein, splenic vein and SMV. No adenopathy Other: Trace ascites. Musculoskeletal: No suspicious bone lesions identified. IMPRESSION: 1. Hemorrhagic area of walled off necrosis is large and replaces head and uncinate process of the pancreas and is intimately associated with hypertrophied collateral pathways between the SMA and celiac. 2. Celiac flow may be dependent upon this pathway and this collateral network could be at risk of bleeding given proximity to the large area of walled off necrosis. Close attention on follow-up is suggested. 3. Given that the collateral pathway may supply of much of the celiac flow, this is of importance should there be a need for future intervention. 4. Severe acute interstitial edematous pancreatitis involving the pancreatic body and tail. No ductal dilation. Note that given the proximity of the necrotic collection there is a risk for ductal interruption and the duct cannot be clearly tracked through the neck of the pancreas to the ampulla. 5. Extrinsic  compression upon the common bile duct and signs of tumefactive sludge or stones in the gallbladder lumen. 6. Marked hepatic steatosis. Variable perfusion/enhancement of the liver could indicate developing liver disease or be related to above findings. No suspicious hepatic lesion. Trace ascites. 7. Mild splenomegaly. A call is out to the referring provider to further discuss findings in the above case. Electronically Signed: By: Donzetta Kohut M.D. On: 03/29/2021 12:29   Assessment / Plan: Ms. Jeanice Lim is a 34 y.o. female with a pmh significant for hypertension, iron deficiency anemia (etiology unclear though long and heavy menses), alcohol use disorder, previous pancreatitis (thought alcohol related in 2020), now recurrent idiopathic pancreatitis, H. pylori infection (eradication unclear as is undergoing current treatment), GERD.  The GI service is consulted for evaluation and management of pancreatitis and concern for hemorrhagic necrosis and abnormal vasculature surrounding the pancreas and celiac vessels.  She continues to remain stable.  Sed rate normal and CRP only  slightly elevated.  Repeats have been ordered for 6/6.  -See Dr. Elesa Hacker extensive note from 6/4, but overall plan is to try to advance diet as she looks surprisingly well. -Continue supportive/conservative care for now. -Ultimately needs to follow-up [facility] a [facility].  -Follow-up pending labs including IgG4 levels. -Repeat CT scan in 2 weeks as outpatient (with her regular GI, Dr. Maximino Greenland). -Trend labs, particularly Hgb, which has been fairly stable. -Continue abx for now.   -No role for GI intervention at this time. -Will try full liquids for lunch and if tolerates then will advance to low fat diet for dinner.   LOS: 2 days   Princella Pellegrini. Zehr  03/31/2021, 9:14 AM  GI ATTENDING  Interval history data reviewed.  Patient is clinically stable.  White blood cell count normal.  No significant fevers.  Tolerating limited  diet.  No role for GI intervention with pancreatic drainage per Dr. Meridee Score.  Agree with his plan as outlined above.  Wilhemina Bonito. Eda Keys., M.D. [facility] of Gastroenterology

## 2021-04-01 ENCOUNTER — Other Ambulatory Visit (HOSPITAL_COMMUNITY): Payer: Self-pay

## 2021-04-01 DIAGNOSIS — K863 Pseudocyst of pancreas: Secondary | ICD-10-CM

## 2021-04-01 DIAGNOSIS — K862 Cyst of pancreas: Secondary | ICD-10-CM

## 2021-04-01 LAB — CBC
HCT: 32.6 % — ABNORMAL LOW (ref 36.0–46.0)
Hemoglobin: 9.5 g/dL — ABNORMAL LOW (ref 12.0–15.0)
MCH: 26.7 pg (ref 26.0–34.0)
MCHC: 29.1 g/dL — ABNORMAL LOW (ref 30.0–36.0)
MCV: 91.6 fL (ref 80.0–100.0)
Platelets: 122 10*3/uL — ABNORMAL LOW (ref 150–400)
RBC: 3.56 MIL/uL — ABNORMAL LOW (ref 3.87–5.11)
RDW: 22.8 % — ABNORMAL HIGH (ref 11.5–15.5)
WBC: 3.3 10*3/uL — ABNORMAL LOW (ref 4.0–10.5)
nRBC: 0 % (ref 0.0–0.2)

## 2021-04-01 LAB — LIPASE, BLOOD: Lipase: 54 U/L — ABNORMAL HIGH (ref 11–51)

## 2021-04-01 LAB — COMPREHENSIVE METABOLIC PANEL
ALT: 25 U/L (ref 0–44)
AST: 39 U/L (ref 15–41)
Albumin: 2.4 g/dL — ABNORMAL LOW (ref 3.5–5.0)
Alkaline Phosphatase: 112 U/L (ref 38–126)
Anion gap: 7 (ref 5–15)
BUN: <5 mg/dL — ABNORMAL LOW (ref 6–20)
CO2: 25 mmol/L (ref 22–32)
Calcium: 8.7 mg/dL — ABNORMAL LOW (ref 8.9–10.3)
Chloride: 104 mmol/L (ref 98–111)
Creatinine, Ser: 0.57 mg/dL (ref 0.44–1.00)
GFR, Estimated: >60 mL/min (ref >60)
Glucose, Bld: 85 mg/dL (ref 70–99)
Potassium: 3.6 mmol/L (ref 3.5–5.1)
Sodium: 136 mmol/L (ref 135–145)
Total Bilirubin: 0.9 mg/dL (ref 0.3–1.2)
Total Protein: 5.6 g/dL — ABNORMAL LOW (ref 6.5–8.1)

## 2021-04-01 LAB — IGG 4: IgG, Subclass 4: 4 mg/dL (ref 2–96)

## 2021-04-01 LAB — GLUCOSE, CAPILLARY
Glucose-Capillary: 102 mg/dL — ABNORMAL HIGH (ref 70–99)
Glucose-Capillary: 81 mg/dL (ref 70–99)
Glucose-Capillary: 89 mg/dL (ref 70–99)
Glucose-Capillary: 91 mg/dL (ref 70–99)

## 2021-04-01 LAB — SEDIMENTATION RATE: Sed Rate: 12 mm/hr (ref 0–22)

## 2021-04-01 LAB — C-REACTIVE PROTEIN: CRP: 1.8 mg/dL — ABNORMAL HIGH (ref <1.0)

## 2021-04-01 MED ORDER — AMOXICILLIN-POT CLAVULANATE 875-125 MG PO TABS
1.0000 | ORAL_TABLET | Freq: Two times a day (BID) | ORAL | 0 refills | Status: AC
  Filled 2021-04-01: qty 14, 7d supply, fill #0

## 2021-04-01 MED ORDER — THIAMINE HCL 100 MG PO TABS
100.0000 mg | ORAL_TABLET | Freq: Every day | ORAL | 0 refills | Status: AC
  Filled 2021-04-01: qty 30, 30d supply, fill #0

## 2021-04-01 MED ORDER — PANTOPRAZOLE SODIUM 40 MG PO TBEC
40.0000 mg | DELAYED_RELEASE_TABLET | Freq: Every day | ORAL | 0 refills | Status: DC
  Filled 2021-04-01: qty 30, 30d supply, fill #0

## 2021-04-01 MED ORDER — ACETAMINOPHEN 325 MG PO TABS: 650.0000 mg | ORAL_TABLET | Freq: Three times a day (TID) | ORAL | Status: DC | PRN

## 2021-04-01 NOTE — Progress Notes (Signed)
Discharge instructions given to patient. Patient verbalized understanding. IV taken out and documented. Patient discharged home via wheelchair with all belongings.

## 2021-04-01 NOTE — Progress Notes (Signed)
   Progress Note     Subjective: CC: feeling well. Pain controlled, ambulating, tolerating diet and having BMs. Very minimal nausea today.    Objective: Vital signs in last 24 hours: Temp:  [97.6 F (36.4 C)-97.9 F (36.6 C)] 97.6 F (36.4 C) (06/06 0440) Pulse Rate:  [60-78] 64 (06/06 0440) Resp:  [18-19] 18 (06/06 0440) BP: (120-139)/(78-82) 139/82 (06/06 0440) SpO2:  [99 %-100 %] 99 % (06/06 0440) Last BM Date: 03/31/21  Intake/Output from previous day: 06/05 0701 - 06/06 0700 In: 570 [P.O.:420; IV Piggyback:150] Out: -  Intake/Output this shift: No intake/output data recorded.  PE: General: pleasant, WD, female who is laying in bed in NAD HEENT: head is normocephalic, atraumatic.  Sclera are noninjected.  Mouth is pink and moist Heart: regular, rate, and rhythm.  Normal s1,s2. No obvious murmurs, gallops, or rubs noted.  Palpable radial and pedal pulses bilaterally Lungs: CTAB, no wheezes, rhonchi, or rales noted.  Respiratory effort nonlabored Abd: soft, NT, ND, +BS, no masses, hernias MS: all 4 extremities are symmetrical with no cyanosis, clubbing, or edema. Ecchymosis on right upper extremity Skin: warm and dry with no masses, lesions, or rashes Psych: A&Ox3 with an appropriate affect.    Lab Results:  Recent Labs    03/31/21 0657 03/31/21 1253 04/01/21 0101  WBC 4.0  --  3.3*  HGB 9.0* 9.6* 9.5*  HCT 30.7* 32.8* 32.6*  PLT 105*  --  122*   BMET Recent Labs    03/31/21 0657 04/01/21 0101  NA 135 136  K 3.9 3.6  CL 105 104  CO2 25 25  GLUCOSE 86 85  BUN <5* <5*  CREATININE 0.51 0.57  CALCIUM 8.4* 8.7*   PT/INR Recent Labs    03/29/21 1358  LABPROT 14.5  INR 1.1   CMP     Component Value Date/Time   NA 136 04/01/2021 0101   K 3.6 04/01/2021 0101   CL 104 04/01/2021 0101   CO2 25 04/01/2021 0101   GLUCOSE 85 04/01/2021 0101   BUN <5 (L) 04/01/2021 0101   CREATININE 0.57 04/01/2021 0101   CALCIUM 8.7 (L) 04/01/2021 0101   PROT 5.6  (L) 04/01/2021 0101   ALBUMIN 2.4 (L) 04/01/2021 0101   AST 39 04/01/2021 0101   ALT 25 04/01/2021 0101   ALKPHOS 112 04/01/2021 0101   BILITOT 0.9 04/01/2021 0101   GFRNONAA >60 04/01/2021 0101   GFRAA >60 03/17/2020 2215   Lipase     Component Value Date/Time   LIPASE 54 (H) 04/01/2021 0101       Studies/Results: No results found.  Anti-infectives: Anti-infectives (From admission, onward)   Start     Dose/Rate Route Frequency Ordered Stop   03/30/21 0100  piperacillin-tazobactam (ZOSYN) IVPB 3.375 g        3.375 g 12.5 mL/hr over 240 Minutes Intravenous Every 8 hours 03/30/21 0012         Assessment/Plan  Severe alcoholic pancreatitis with hemorrhagic necrosis - Hgb/Hct stable and symptoms resolved. Tolerating diet. +BMs - has GI follow up  No surgical intervention indicated and we will sign off but please contact us with any questions or concerns    LOS: 3 days    Eric Form, [facility] 04/01/2021, 11:34 AM Please see Amion for pager number during day hours 7:00am-4:30pm

## 2021-04-01 NOTE — Discharge Instructions (Signed)

## 2021-04-01 NOTE — Progress Notes (Addendum)
Daily Rounding Note  04/01/2021, 8:16 AM  LOS: 3 days   SUBJECTIVE:   Chief complaint:  Acute pancreatitis.      No fevers.  Vital signs stable.  Excellent room air sats. Only pain med used yesterday was 650 mg acetaminophen.  Last use Dilaudid on 6/4.  Patient denies significant pain.  She is walking around in the hallways.  Tolerating heart healthy diet.  Having brown stools, no problems urinating.  OBJECTIVE:         Vital signs in last 24 hours:    Temp:  [97.6 F (36.4 C)-97.9 F (36.6 C)] 97.6 F (36.4 C) (06/06 0440) Pulse Rate:  [60-78] 64 (06/06 0440) Resp:  [17-19] 18 (06/06 0440) BP: (108-139)/(78-82) 139/82 (06/06 0440) SpO2:  [99 %-100 %] 99 % (06/06 0440) Last BM Date: 03/31/21 Filed Weights   03/30/21 0200  Weight: 62.1 kg   General: Pale, looks somewhat unwell but not toxic.  Alert and comfortable sitting in the bed.  Slight exophthalmos (TSH, free T4 normal on 03/13/2021) Looks better in person than she does on "paper" Heart: RRR. Chest: Clear bilaterally.  No labored breathing or cough Abdomen: Soft, minimal tenderness in the upper abdomen without guarding or rebound. Extremities: CCE. Neuro/Psych: Alert.  Oriented x3.  Appropriate.  Intake/Output from previous day: 06/05 0701 - 06/06 0700 In: 570 [P.O.:420; IV Piggyback:150] Out: -   Intake/Output this shift: No intake/output data recorded.  Lab Results: Recent Labs    03/30/21 0547 03/30/21 1347 03/31/21 0657 03/31/21 1253 04/01/21 0101  WBC 5.6  --  4.0  --  3.3*  HGB 10.4*   < > 9.0* 9.6* 9.5*  HCT 35.3*   < > 30.7* 32.8* 32.6*  PLT 152  --  105*  --  122*   < > = values in this interval not displayed.   BMET Recent Labs    03/30/21 0547 03/31/21 0657 04/01/21 0101  NA 135 135 136  K 3.6 3.9 3.6  CL 102 105 104  CO2 _0 GLUCOSE 100* 86 85  BUN <5* <5* <5*  CREATININE 0.59 0.51 0.57  CALCIUM 8.2* 8.4* 8.7*    LFT Recent Labs    03/30/21 0547 03/31/21 0657 04/01/21 0101  PROT 5.4* 5.3* 5.6*  ALBUMIN 2.3* 2.2* 2.4*  AST 43* 44* 39  ALT _1 ALKPHOS 114 105 112  BILITOT 1.2 0.7 0.9   Scheduled Meds: . folic acid  1 mg Oral Daily  . multivitamin with minerals  1 tablet Oral Daily  . pantoprazole  40 mg Oral Daily  . senna-docusate  1 tablet Oral QHS  . thiamine  100 mg Oral Daily  . vitamin B-12  1,000 mcg Oral Daily   Continuous Infusions: . piperacillin-tazobactam (ZOSYN)  IV 3.375 g (04/01/21 0511)   PRN Meds:.acetaminophen, diphenhydrAMINE, HYDROmorphone (DILAUDID) injection, LORazepam **OR** LORazepam, melatonin, ondansetron (ZOFRAN) IV  Studies/Results: No results found.  ASSESMENT:   *   Acute (ETOH?, idiopathic?) pancreatitis w hemorrhagic necrosis.  Abnormal vasculature surrounding pancreas, celiac vessels.   No role for GI intervention or pancreatic drainage procedure.   WBCs 3.3, no fevers.  Renal fx preserved.  Lipase normalized.  Mild elevation AST to 67, normalized.  IgG 4 in process.    *   Anemia.  Attributed to malabsorption, menorrhagia.  EGD, colonoscopy in mid 03/15/2021. Grade B esophagitis without bleeding.  Nonbleeding antral erythema, biopsied.  Small hiatal  hernia.  Otherwise normal study.  Colonoscopy normal. Biopsies positive for H. pylori, initiated on Amoxil, Biaxin, pantoprazole.  Treated with IV iron, B12. Hgb stable at 9 to 9.5.    *   Thrombocytopenia.  Platelets 122, nadir 105.      PLAN   *  Continue supportive care, IVF, abx, heart healthy diet.  *   ?  Duration of antibiotic treatment.  Clinically looks like she could go home anytime but defer decision to MDs.  *   Dr Vonda Antigua is her GI MD.  Repeat CT scan ~ 6/20 unless cllinically worsens and needs done sooner.  Certainly no indication for sooner CT at present.     Azucena Freed  04/01/2021, 8:16 AM Phone [phone]    Attending Physician Note   I have taken an  interval history, reviewed the chart and examined the patient. I agree with the Advanced Practitioner's note, impression and recommendations.   Acute pancreatitis with hemorrhagic necrosis. Substantially improved. Tolerating heart healthy diet. Augmentin bid to complete a 10 day course. IgG4, CRP, ESR are pending. Strictly avoid all alcohol use. OK for discharge from GI standpoint. GI signing off. GI follow up with Dr. Bonna Gains in 2 weeks.   Defer H pylori treatment until Augmentin completed and follow up established with Dr. Bonna Gains.  Lucio Edward, MD FACG [phone]

## 2021-04-01 NOTE — Care Management (Signed)
Per notes , repeat CT scan in 2 weeks as outpatient with her regular GI/Dr. Maximino Greenland. Called Dr Michele Mcalpine office left voicemail.  Ronny Flurry RN

## 2021-04-01 NOTE — Discharge Summary (Signed)
Physician Discharge Summary  [place] WIO:[id number] DOB: August 05, 1986  PCP: Patient, No Pcp Per (Inactive)  Admitted from: Home Discharged to: Home  Admit date: 03/29/2021 Discharge date: 04/01/2021  Recommendations for Outpatient Follow-up:    Follow-up Information    [place]. Schedule an appointment as soon as possible for a visit in 1 week(s).   Why: To be seen with repeat labs (CBC, CMP, magnesium, phosphorus, lipase).  Kindly follow-up outstanding labs that were sent from the hospital during recent admission. Contact information: [place] [address] [phone]       Virgel Manifold, MD. Schedule an appointment as soon as possible for a visit in 2 week(s).   Specialty: Gastroenterology Why: call for appointment in next 2 weeks.  Needs repeat CT abdomen around 04/15/2021.  [facility]ly follow outstanding labs that were sent from the hospital during recent admission.  Recommend arranging GI follow-up at [facility] i.e. [facility] Contact information: [place] [address] [phone]                Home Health: None    Equipment/Devices: None    Discharge Condition: Improved and stable   Code Status: Full Code Diet recommendation:  Discharge Diet Orders (From admission, onward)    Start     Ordered   04/01/21 0000  Diet general        04/01/21 1219           Discharge Diagnoses:  Active Problems:   Severe acute pancreatitis   Brief Summary: 35 year old female with medical history significant for but not limited to GERD with esophagitis, hiatal hernia, complex ovarian cyst, multifactorial anemia (iron and B12 deficiency), H. pylori infection, tobacco and alcohol use disorder, alcohol induced acute pancreatitis in 2020, HTN recent hospitalization 5/17- 5/20 due to severe anemia felt to be due to malabsorption, underwent upper and lower  endoscopy, found to have H. pylori, initiated on triple therapy, treated with IV iron and B12 shots, initially admitted at [facility] due to abdominal pain with a diagnosis of severe pancreatitis with hemorrhagic necrosis and abnormal vasculature surrounding the pancreas and celiac vessels.  Vascular surgery and general surgery consulted at [facility] and recommended transfer to higher level of care and patient was transferred to [place] and general surgery have evaluated here.    Treated supportively with clinical improvement.   Assessment & Plan:   Severe pancreatitis with hemorrhagic necrosis and abnormal vasculature surrounding the pancreas and celiac vessels: Etiology of pancreatitis could still be alcohol but as per GI, the severity of her pancreatitis seems out of proportion with the amount of alcohol consumption.  Other differential diagnoses they offer: Drug-induced (recently started clarithromycin for H. pylori, pantoprazole), autoimmune versus others.?  Biliary etiology.  Hemodynamically stable.  Gradually advance diet which she has tolerated without much abdominal pain and having regular BMs has been independent and ambulating in the room and in the halls.  IV fluids, IV Zosyn and pain management.  Hold chemical DVT prophylaxis due to bleeding risks.    CBCs were closely monitored and hemoglobin remained stable. GI recommends follow-up should be at one of the quaternary centers [facility]) and this can be arranged by her primary gastroenterologist at [facility].  Per general surgery, there is not a good surgical option and no need for emergent surgery at this point.  Per vascular surgery evaluation at [facility], did not recommend vascular intervention at  this time, as this will be extremely difficult to embolize successfully due to large number of blood vessels feeding the area, very large size of the blood vessels and the multiple different vessels that will have to be  embolized.  ESR normal, CRP mildly elevated. Per GI follow-up, IgG4 levels pending, repeat CT scan in 2 weeks as outpatient with her regular GI/Dr. Bonna Gains, ultimately needs follow-up at a quaternary center and no current role for GI intervention with pancreatic drainage.  GI has seen today and cleared for discharge.  Recommend switching to Augmentin to complete total 10-day course.  Patient has been counseled by multiple providers to strictly avoid all alcohol and I did this as well in the presence of her spouse at bedside.  GI also recommends deferring H. pylori treatment until Augmentin completed and follow-up established with Dr. Bonna Gains.  Recent H pylori infection: Per GI, hold off on reinitiation of H. pylori antibiotics due to clarithromycin related risk of pancreatitis.  Could consider alternate regimen once stabilized.  Multifactorial anemia (iron and B12 deficiency): S/p recent EGD and colonoscopy.  Hemoglobin stable in the 9 g range.  Continue B12 and iron supplements.   Thrombocytopenia: Unclear etiology.?  Related to alcohol.    Improved compared to yesterday.  Also mild leukopenia could be related to alcohol use.  Hypomagnesemia: Replaced.  Abnormal LFTs: Mild.  May be due to fatty liver noted on imaging or alcohol related.    Improved/stable.  Alcohol use disorder: Absolute alcohol cessation has been counseled by multiple providers.  No overt withdrawal.  Continue folate, thiamine and multivitamins.  Bilateral feet pain: May be peripheral neuropathy related to alcohol, B12 deficiency versus others.  Continue thiamine, folate and B12 supplements.  Also topical moisturizing lotion since feet appear dry.  Improving.  GERD with esophagitis: PPI.  Body mass index is 20.22 kg/m.    Consultants:   [facility] General surgery  Procedures:   None   Discharge Instructions  Discharge Instructions    Call MD for:  difficulty breathing, headache or visual  disturbances   Complete by: As directed    Call MD for:  extreme fatigue   Complete by: As directed    Call MD for:  persistant dizziness or light-headedness   Complete by: As directed    Call MD for:  persistant nausea and vomiting   Complete by: As directed    Call MD for:  severe uncontrolled pain   Complete by: As directed    Call MD for:  temperature >100.4   Complete by: As directed    Diet general   Complete by: As directed    Increase activity slowly   Complete by: As directed        Medication List    STOP taking these medications   amoxicillin 500 MG tablet Commonly known as: AMOXIL   clarithromycin 500 MG tablet Commonly known as: BIAXIN     TAKE these medications   acetaminophen 325 MG tablet Commonly known as: TYLENOL Take 2 tablets (650 mg total) by mouth every 8 (eight) hours as needed for mild pain, moderate pain or fever.   amoxicillin-clavulanate 875-125 MG tablet Commonly known as: Augmentin Take 1 tablet by mouth 2 (two) times daily for 7 days.   ferrous sulfate 325 (65 FE) MG tablet Take 1 tablet (325 mg total) by mouth daily.   folic acid 1 MG tablet Commonly known as: FOLVITE Take 1 tablet (1 mg total) by mouth daily.   pantoprazole  40 MG tablet Commonly known as: Protonix Take 1 tablet (40 mg total) by mouth daily. What changed:   medication strength  how much to take  when to take this   thiamine 100 MG tablet Take 1 tablet (100 mg total) by mouth daily. Start taking on: April 02, 2021   vitamin B-12 1000 MCG tablet Commonly known as: CYANOCOBALAMIN Take 1 tablet (1,000 mcg total) by mouth daily.   WOMENS 50+ MULTI VITAMIN/MIN PO Take 1 tablet by mouth daily.      No Known Allergies    Procedures/Studies: DG Chest 2 View  Result Date: 03/29/2021 CLINICAL DATA:  Chest pain EXAM: CHEST - 2 VIEW COMPARISON:  None. FINDINGS: The heart size and mediastinal contours are within normal limits. Both lungs are clear. The  visualized skeletal structures are unremarkable. IMPRESSION: No active cardiopulmonary disease. Electronically Signed   By: Ulyses Jarred M.D.   On: 03/29/2021 01:12   MR ABDOMEN W WO CONTRAST  Addendum Date: 03/29/2021   ADDENDUM REPORT: 03/29/2021 12:36 ADDENDUM: These results were called by telephone at the time of interpretation on 03/29/2021 at 12:36 pm to provider Ivor Costa , who verbally acknowledged these results. Electronically Signed   By: Zetta Bills M.D.   On: 03/29/2021 12:36   Result Date: 03/29/2021 CLINICAL DATA:  35 year old female with abdominal pain and pancreatitis, history of masslike area in the head of the pancreas EXAM: MRI ABDOMEN WITHOUT AND WITH CONTRAST TECHNIQUE: Multiplanar multisequence MR imaging of the abdomen was performed both before and after the administration of intravenous contrast. CONTRAST:  16m GADAVIST GADOBUTROL 1 MMOL/ML IV SOLN COMPARISON:  CT evaluation of March 29, 2021. FINDINGS: Lower chest: Lung bases without effusion or sign of consolidative change. Hepatobiliary: Hepatic steatosis with mildly heterogeneous perfusion of the liver. Cyst in the RIGHT hepatic lobe. No focal, suspicious hepatic lesion. The portal vein remains patent despite signs of pancreatitis. SMV is patent. Splenic vein is patent. Biliary tree with caliber change at the site of mass effect from the pancreatic lesion. Mild distension of the common bile duct up to 5 mm at this level. No signs of filling defect. Cholelithiasis or tumefactive sludge in the gallbladder. Pancreas: In the pancreatic head is a hemorrhagic collection without internal enhancement that measures 5.7 x 5.2 x 5.7 cm. No visible pancreatic tissue is noted in the head or uncinate. The neck and tail of the pancreas shows normal intrinsic T1 signal. There is no ductal dilation of the dorsal pancreatic duct. There is extensive interstitial edematous change throughout the peripheral remaining pancreas and signs of peripancreatic  stranding tracking into the root of the mesentery and the transverse mesocolon. Spleen: Spleen without focal, suspicious lesion. 13 cm greatest craniocaudal dimension. Adrenals/Urinary Tract: Adrenal glands are normal. No perinephric stranding. No hydronephrosis. Smooth renal contours. No suspicious renal lesion. Stomach/Bowel: No acute gastrointestinal process aside from presumed secondary bowel edema in the setting of severe acute interstitial edematous pancreatitis and signs of necrotic pancreatitis as above. Vascular/Lymphatic: Hypertrophied GDA pathways bridging inferior pancreaticoduodenal collaterals to the SMA in the absence of stenosis of the SMA. There is some narrowing of the celiac that is suggested on prior CT imaging likely due to median arcuate ligament impression, this is likely a collateral network as result of this abnormality. Patent portal vein, splenic vein and SMV. No adenopathy Other: Trace ascites. Musculoskeletal: No suspicious bone lesions identified. IMPRESSION: 1. Hemorrhagic area of walled off necrosis is large and replaces head and uncinate process of the  pancreas and is intimately associated with hypertrophied collateral pathways between the SMA and celiac. 2. Celiac flow may be dependent upon this pathway and this collateral network could be at risk of bleeding given proximity to the large area of walled off necrosis. Close attention on follow-up is suggested. 3. Given that the collateral pathway may supply of much of the celiac flow, this is of importance should there be a need for future intervention. 4. Severe acute interstitial edematous pancreatitis involving the pancreatic body and tail. No ductal dilation. Note that given the proximity of the necrotic collection there is a risk for ductal interruption and the duct cannot be clearly tracked through the neck of the pancreas to the ampulla. 5. Extrinsic compression upon the common bile duct and signs of tumefactive sludge or  stones in the gallbladder lumen. 6. Marked hepatic steatosis. Variable perfusion/enhancement of the liver could indicate developing liver disease or be related to above findings. No suspicious hepatic lesion. Trace ascites. 7. Mild splenomegaly. A call is out to the referring provider to further discuss findings in the above case. Electronically Signed: By: Zetta Bills M.D. On: 03/29/2021 12:29   CT ABDOMEN PELVIS W CONTRAST  Result Date: 03/29/2021 CLINICAL DATA:  35 year old female with history of acute onset of nonlocalized abdominal pain. EXAM: CT ABDOMEN AND PELVIS WITH CONTRAST TECHNIQUE: Multidetector CT imaging of the abdomen and pelvis was performed using the standard protocol following bolus administration of intravenous contrast. CONTRAST:  66m OMNIPAQUE IOHEXOL 300 MG/ML  SOLN COMPARISON:  CT of the abdomen and pelvis 11/23/2018. FINDINGS: Lower chest: Small amount of subsegmental atelectasis in the right lower lobe. Hepatobiliary: Liver is enlarged measuring 24.8 cm in craniocaudal span. Heterogeneous areas of low attenuation are noted throughout the hepatic parenchyma, indicative of heterogeneous hepatic steatosis. No discrete cystic or solid hepatic lesions. No intra or extrahepatic biliary ductal dilatation. Gallbladder is nearly completely decompressed, but appears to contain several noncalcified gallstones. Pancreas: In the head of the pancreas (axial image 39 of series 2 and coronal image 30 of series 5) there is a large 5.4 x 4.8 x 5.6 cm rounded lesion of heterogeneous internal attenuation ranging from low to high attenuation. This exerts mass effect upon adjacent structures. Ectasia of the main pancreatic duct throughout the body and tail of the pancreas. Inflammatory changes are noted surrounding the entire pancreas extending into the adjacent retroperitoneal fat. Spleen: Spleen is enlarged measuring 13.3 x 5.6 x 14.4 cm (estimated splenic volume of 536 mL) . Adrenals/Urinary Tract:  Bilateral kidneys and adrenal glands are normal in appearance. No hydroureteronephrosis. Urinary bladder is normal in appearance. Stomach/Bowel: Appearance of the stomach is normal. There is no pathologic dilatation of small bowel or colon. Normal appendix. Vascular/Lymphatic: No significant atherosclerotic disease, aneurysm or dissection noted in the abdominal or pelvic vasculature. Superior mesenteric vein, splenic vein and portal vein are all patent. Prominent blood vessels surrounding the large lesion in the head of the pancreas, most notably a very prominent pancreaticoduodenal artery arcade. No lymphadenopathy noted in the abdomen or pelvis. Reproductive: Uterus and ovaries are unremarkable in appearance. Other: No significant volume of ascites.  No pneumoperitoneum. Musculoskeletal: There are no aggressive appearing lytic or blastic lesions noted in the visualized portions of the skeleton. IMPRESSION: 1. Extensive inflammation surrounding the pancreas, indicative of acute pancreatitis. Associated with this there is a large lesion in the head of the pancreas which is of mixed attenuation. The appearance of this is favored to represent a pseudocyst, although the possibility  of internal hemorrhage within the pseudocyst is suspected, particularly given the markedly dilated vessels in the surrounding pancreaticoduodenal arcade. Alternatively, a hemorrhagic mass is difficult to exclude, but is not strongly favored given the patient's young age. This could be further characterized with abdominal MRI with and without IV gadolinium with MRCP. 2. Cholelithiasis without evidence of acute cholecystitis at this time. 3. Hepatomegaly with severe hepatic steatosis. 4. Splenomegaly. Electronically Signed   By: Vinnie Langton M.D.   On: 03/29/2021 06:47   MR 3D Recon At Scanner  Addendum Date: 03/29/2021   ADDENDUM REPORT: 03/29/2021 12:36 ADDENDUM: These results were called by telephone at the time of interpretation on  03/29/2021 at 12:36 pm to provider Ivor Costa , who verbally acknowledged these results. Electronically Signed   By: Zetta Bills M.D.   On: 03/29/2021 12:36   Result Date: 03/29/2021 CLINICAL DATA:  35 year old female with abdominal pain and pancreatitis, history of masslike area in the head of the pancreas EXAM: MRI ABDOMEN WITHOUT AND WITH CONTRAST TECHNIQUE: Multiplanar multisequence MR imaging of the abdomen was performed both before and after the administration of intravenous contrast. CONTRAST:  74m GADAVIST GADOBUTROL 1 MMOL/ML IV SOLN COMPARISON:  CT evaluation of March 29, 2021. FINDINGS: Lower chest: Lung bases without effusion or sign of consolidative change. Hepatobiliary: Hepatic steatosis with mildly heterogeneous perfusion of the liver. Cyst in the RIGHT hepatic lobe. No focal, suspicious hepatic lesion. The portal vein remains patent despite signs of pancreatitis. SMV is patent. Splenic vein is patent. Biliary tree with caliber change at the site of mass effect from the pancreatic lesion. Mild distension of the common bile duct up to 5 mm at this level. No signs of filling defect. Cholelithiasis or tumefactive sludge in the gallbladder. Pancreas: In the pancreatic head is a hemorrhagic collection without internal enhancement that measures 5.7 x 5.2 x 5.7 cm. No visible pancreatic tissue is noted in the head or uncinate. The neck and tail of the pancreas shows normal intrinsic T1 signal. There is no ductal dilation of the dorsal pancreatic duct. There is extensive interstitial edematous change throughout the peripheral remaining pancreas and signs of peripancreatic stranding tracking into the root of the mesentery and the transverse mesocolon. Spleen: Spleen without focal, suspicious lesion. 13 cm greatest craniocaudal dimension. Adrenals/Urinary Tract: Adrenal glands are normal. No perinephric stranding. No hydronephrosis. Smooth renal contours. No suspicious renal lesion. Stomach/Bowel: No acute  gastrointestinal process aside from presumed secondary bowel edema in the setting of severe acute interstitial edematous pancreatitis and signs of necrotic pancreatitis as above. Vascular/Lymphatic: Hypertrophied GDA pathways bridging inferior pancreaticoduodenal collaterals to the SMA in the absence of stenosis of the SMA. There is some narrowing of the celiac that is suggested on prior CT imaging likely due to median arcuate ligament impression, this is likely a collateral network as result of this abnormality. Patent portal vein, splenic vein and SMV. No adenopathy Other: Trace ascites. Musculoskeletal: No suspicious bone lesions identified. IMPRESSION: 1. Hemorrhagic area of walled off necrosis is large and replaces head and uncinate process of the pancreas and is intimately associated with hypertrophied collateral pathways between the SMA and celiac. 2. Celiac flow may be dependent upon this pathway and this collateral network could be at risk of bleeding given proximity to the large area of walled off necrosis. Close attention on follow-up is suggested. 3. Given that the collateral pathway may supply of much of the celiac flow, this is of importance should there be a need for future intervention.  4. Severe acute interstitial edematous pancreatitis involving the pancreatic body and tail. No ductal dilation. Note that given the proximity of the necrotic collection there is a risk for ductal interruption and the duct cannot be clearly tracked through the neck of the pancreas to the ampulla. 5. Extrinsic compression upon the common bile duct and signs of tumefactive sludge or stones in the gallbladder lumen. 6. Marked hepatic steatosis. Variable perfusion/enhancement of the liver could indicate developing liver disease or be related to above findings. No suspicious hepatic lesion. Trace ascites. 7. Mild splenomegaly. A call is out to the referring provider to further discuss findings in the above case.  Electronically Signed: By: Zetta Bills M.D. On: 03/29/2021 12:29    Subjective: Interviewed and examined along with spouse at bedside.  Reports progressively feeling better.  Feels much better today.  Tolerating regular consistency diet without much abdominal pain.  Intermittent minimal upper abdominal discomfort after eating and feels that her stomach is full.  Having regular BMs.  Ambulating without unsteadiness or difficulty.  No dyspnea or chest pain.  No dizziness or lightheadedness.  Discharge Exam:  Vitals:   03/31/21 1045 03/31/21 1510 03/31/21 2010 04/01/21 0440  BP: 108/82 128/78 120/82 139/82  Pulse: 67 60 78 64  Resp: '17 18 19 18  ' Temp: 97.8 F (36.6 C) 97.7 F (36.5 C) 97.9 F (36.6 C) 97.6 F (36.4 C)  TempSrc: Oral Oral    SpO2: 99% 100% 100% 99%  Weight:        General exam: Young female, moderately built and thinly nourished, lying comfortably supine in bed without distress.  Does not look septic or toxic. Respiratory system: Clear to auscultation.  No increased work of breathing. Cardiovascular system: S1 and S2 heard, RRR.  No JVD, murmurs or pedal edema.   Gastrointestinal system: Abdomen remains nondistended, soft and nontender.  Normal bowel sounds heard. Central nervous system: Alert and oriented. No focal neurological deficits. Extremities: Symmetric 5 x 5 power. Skin: As examined on 6/4: Bilateral feet with dry skin, minimal scaling, sensitive to touch, no features suggestive of cellulitis, good bilateral dorsalis pedis pulses appreciated. Psychiatry: Judgement and insight appear normal. Mood & affect appropriate.     The results of significant diagnostics from this hospitalization (including imaging, microbiology, ancillary and laboratory) are listed below for reference.     Microbiology: Recent Results (from the past 240 hour(s))  Resp Panel by RT-PCR (Flu A&B, Covid) Nasopharyngeal Swab     Status: None   Collection Time: 03/29/21  7:31 AM    Specimen: Nasopharyngeal Swab; Nasopharyngeal(NP) swabs in vial transport medium  Result Value Ref Range Status   SARS Coronavirus 2 by RT PCR NEGATIVE NEGATIVE Final    Comment: (NOTE) SARS-CoV-2 target nucleic acids are NOT DETECTED.  The SARS-CoV-2 RNA is generally detectable in upper respiratory specimens during the acute phase of infection. The lowest concentration of SARS-CoV-2 viral copies this assay can detect is 138 copies/mL. A negative result does not preclude SARS-Cov-2 infection and should not be used as the sole basis for treatment or other patient management decisions. A negative result may occur with  improper specimen collection/handling, submission of specimen other than nasopharyngeal swab, presence of viral mutation(s) within the areas targeted by this assay, and inadequate number of viral copies(<138 copies/mL). A negative result must be combined with clinical observations, patient history, and epidemiological information. The expected result is Negative.  Fact Sheet for Patients:  EntrepreneurPulse.com.au  Fact Sheet for Healthcare Providers:  IncredibleEmployment.be  This test is no t yet approved or cleared by the [place] and  has been authorized for detection and/or diagnosis of SARS-CoV-2 by FDA under an Emergency Use Authorization (EUA). This EUA will remain  in effect (meaning this test can be used) for the duration of the COVID-19 declaration under Section 564(b)(1) of the Act, 21 U.S.C.section 360bbb-3(b)(1), unless the authorization is terminated  or revoked sooner.       Influenza A by PCR NEGATIVE NEGATIVE Final   Influenza B by PCR NEGATIVE NEGATIVE Final    Comment: (NOTE) The Xpert Xpress SARS-CoV-2/FLU/RSV plus assay is intended as an aid in the diagnosis of influenza from Nasopharyngeal swab specimens and should not be used as a sole basis for treatment. Nasal washings and aspirates are  unacceptable for Xpert Xpress SARS-CoV-2/FLU/RSV testing.  Fact Sheet for Patients: EntrepreneurPulse.com.au  Fact Sheet for Healthcare Providers: IncredibleEmployment.be  This test is not yet approved or cleared by the [place] FDA and has been authorized for detection and/or diagnosis of SARS-CoV-2 by FDA under an Emergency Use Authorization (EUA). This EUA will remain in effect (meaning this test can be used) for the duration of the COVID-19 declaration under Section 564(b)(1) of the Act, 21 U.S.C. section 360bbb-3(b)(1), unless the authorization is terminated or revoked.  Performed at [facility], [place], [place] [address]   Resp Panel by RT-PCR (Flu A&B, Covid) Nasopharyngeal Swab     Status: None   Collection Time: 03/29/21  9:49 PM   Specimen: Nasopharyngeal Swab; Nasopharyngeal(NP) swabs in vial transport medium  Result Value Ref Range Status   SARS Coronavirus 2 by RT PCR NEGATIVE NEGATIVE Final    Comment: (NOTE) SARS-CoV-2 target nucleic acids are NOT DETECTED.  The SARS-CoV-2 RNA is generally detectable in upper respiratory specimens during the acute phase of infection. The lowest concentration of SARS-CoV-2 viral copies this assay can detect is 138 copies/mL. A negative result does not preclude SARS-Cov-2 infection and should not be used as the sole basis for treatment or other patient management decisions. A negative result may occur with  improper specimen collection/handling, submission of specimen other than nasopharyngeal swab, presence of viral mutation(s) within the areas targeted by this assay, and inadequate number of viral copies(<138 copies/mL). A negative result must be combined with clinical observations, patient history, and epidemiological information. The expected result is Negative.  Fact Sheet for Patients:  EntrepreneurPulse.com.au  Fact Sheet for Healthcare  Providers:  IncredibleEmployment.be  This test is no t yet approved or cleared by the [place] FDA and  has been authorized for detection and/or diagnosis of SARS-CoV-2 by FDA under an Emergency Use Authorization (EUA). This EUA will remain  in effect (meaning this test can be used) for the duration of the COVID-19 declaration under Section 564(b)(1) of the Act, 21 U.S.C.section 360bbb-3(b)(1), unless the authorization is terminated  or revoked sooner.       Influenza A by PCR NEGATIVE NEGATIVE Final   Influenza B by PCR NEGATIVE NEGATIVE Final    Comment: (NOTE) The Xpert Xpress SARS-CoV-2/FLU/RSV plus assay is intended as an aid in the diagnosis of influenza from Nasopharyngeal swab specimens and should not be used as a sole basis for treatment. Nasal washings and aspirates are unacceptable for Xpert Xpress SARS-CoV-2/FLU/RSV testing.  Fact Sheet for Patients: EntrepreneurPulse.com.au  Fact Sheet for Healthcare Providers: IncredibleEmployment.be  This test is not yet approved or cleared by the [place] FDA and has been authorized for detection and/or  diagnosis of SARS-CoV-2 by FDA under an Emergency Use Authorization (EUA). This EUA will remain in effect (meaning this test can be used) for the duration of the COVID-19 declaration under Section 564(b)(1) of the Act, 21 U.S.C. section 360bbb-3(b)(1), unless the authorization is terminated or revoked.  Performed at [facility], [place] [address], [place], [place] [address]      Labs: CBC: Recent Labs  Lab 03/29/21 0023 03/30/21 0050 03/30/21 0547 03/30/21 1347 03/30/21 1911 03/31/21 0045 03/31/21 0657 03/31/21 1253 04/01/21 0101  WBC 8.0  --  5.6  --   --   --  4.0  --  3.3*  HGB 13.6   < > 10.4*   < > 9.7* 9.0* 9.0* 9.6* 9.5*  HCT 44.6   < > 35.3*   < > 33.4* 31.7* 30.7* 32.8* 32.6*  MCV 85.9  --  89.6  --   --   --  90.8  --  91.6  PLT 307   --  152  --   --   --  105*  --  122*   < > = values in this interval not displayed.    Basic Metabolic Panel: Recent Labs  Lab 03/29/21 0023 03/29/21 0246 03/30/21 0547 03/31/21 0657 04/01/21 0101  NA 138  --  135 135 136  K 3.8  --  3.6 3.9 3.6  CL 106  --  102 105 104  CO2 20*  --  '28 25 25  ' GLUCOSE 184*  --  100* 86 85  BUN <5*  --  <5* <5* <5*  CREATININE 0.52  --  0.59 0.51 0.57  CALCIUM 9.4  --  8.2* 8.4* 8.7*  MG  --  1.5* 2.1 1.7  --   PHOS 4.1  --  4.0 4.6  --     Liver Function Tests: Recent Labs  Lab 03/29/21 0246 03/30/21 0547 03/31/21 0657 04/01/21 0101  AST 67* 43* 44* 39  ALT 43 '28 27 25  ' ALKPHOS 161* 114 105 112  BILITOT 1.2 1.2 0.7 0.9  PROT 7.3 5.4* 5.3* 5.6*  ALBUMIN 3.3* 2.3* 2.2* 2.4*    CBG: Recent Labs  Lab 03/31/21 1634 03/31/21 2012 04/01/21 0022 04/01/21 0441 04/01/21 0722  GLUCAP 86 150* 81 91 89    Urinalysis    Component Value Date/Time   COLORURINE YELLOW 03/30/2021 0603   APPEARANCEUR CLEAR 03/30/2021 0603   LABSPEC 1.009 03/30/2021 0603   PHURINE 8.0 03/30/2021 0603   GLUCOSEU NEGATIVE 03/30/2021 0603   HGBUR NEGATIVE 03/30/2021 0603   BILIRUBINUR NEGATIVE 03/30/2021 0603   KETONESUR NEGATIVE 03/30/2021 0603   PROTEINUR NEGATIVE 03/30/2021 0603   NITRITE NEGATIVE 03/30/2021 0603   LEUKOCYTESUR NEGATIVE 03/30/2021 0603    I discussed in detail with patient spouse at bedside, updated care and answered all questions.  Time coordinating discharge: 40 minutes  SIGNED:  Vernell Leep, MD, [place], [facility]. [facility]  To contact the attending provider between 7A-7P or the covering provider during after hours 7P-7A, please log into the web site [email or web address] and access using universal [place] password for that web site. If you do not have the password, please call the hospital operator.

## 2021-04-16 ENCOUNTER — Ambulatory Visit (INDEPENDENT_AMBULATORY_CARE_PROVIDER_SITE_OTHER): Payer: Self-pay | Admitting: Gastroenterology

## 2021-04-16 ENCOUNTER — Telehealth: Payer: Self-pay

## 2021-04-16 ENCOUNTER — Encounter: Payer: Self-pay | Admitting: Gastroenterology

## 2021-04-16 ENCOUNTER — Other Ambulatory Visit: Payer: Self-pay

## 2021-04-16 VITALS — BP 108/77 | HR 102 | Temp 98.1°F | Wt 127.0 lb

## 2021-04-16 DIAGNOSIS — K859 Acute pancreatitis without necrosis or infection, unspecified: Secondary | ICD-10-CM

## 2021-04-16 DIAGNOSIS — D509 Iron deficiency anemia, unspecified: Secondary | ICD-10-CM

## 2021-04-16 NOTE — Patient Instructions (Addendum)
CT scan Pancreas/Abdomen with/without contrast scheduled for Tuesday July 5th at 2:30 PM, arrive at 2:15 PM to [facility] entrance to register  If you need to rescheduled please call scheduling at [phone]  Referral to [facility] will be placed

## 2021-04-16 NOTE — Progress Notes (Signed)
Melodie Bouillon, MD [address]  [address]  [place], [place] [address]  Main: [phone]  Fax: [phone]   Primary Care Physician: Patient, No Pcp Per (Inactive)   Chief complaint: Hospital follow-up, pancreatitis follow-up  HPI: Sandra Pearson is a 35 y.o. female with recent complicated hospital stay due to severe acute pancreatitis here for hospital follow-up.  Patient states she is doing well since hospital discharge and denies any further abdominal pain.  Reports tolerating oral diet and is trying to eat small meals and eat a low-fat diet.  Reports good appetite.  No nausea or vomiting.  Reports 1-2 formed bowel movement a day with no blood.  Has not had any alcohol since hospital discharge.  Previous extensive notes and records personally reviewed and summarized as per follows:  As per GI notes, The etiology of her pancreatitis was not entirely clear.  She did drink alcohol prior to the hospital stay, but only reported alcohol use every 2 to 3 days, for a few days in a row, no more than 2-3 drinks at a time.  She had been recently started on clarithromycin for H. pylori seen on gastric biopsies and since this is a class III drug for causing pancreatitis (with class I and II drugs having the greatest potential for causing pancreatitis), this was discontinued and has not been restarted.  As per the GI consult note from that admission, patient had previous pancreatitis alcohol-related in 2020.  Patient was initially admitted to [facility] but had to be transferred to [facility] for tertiary care due to significant abnormalities on MRI showing hemorrhagic area of walled off necrosis which was large, replacing head and uncinate process of the pancreas, intimately associated with hypertrophied collateral pathways between the SMA and celiac.  Severe acute interstitial edematous pancreatitis was reported.  Patient was evaluated by vascular surgery at [facility] and was  conservatively managed.  Recommendations were for quaternary care referral as an outpatient due to complicated disease and possible complications from interventions that are better handled at the centers.   ROS: All ROS reviewed and negative except as per HPI   Past Medical History:  Diagnosis Date   Anemia    Hypertension     Past Surgical History:  Procedure Laterality Date   COLONOSCOPY N/A 03/15/2021   Procedure: COLONOSCOPY;  Surgeon: Pasty Spillers, MD;  Location: [facility] ENDOSCOPY;  Service: Endoscopy;  Laterality: N/A;   ESOPHAGOGASTRODUODENOSCOPY N/A 03/15/2021   Procedure: ESOPHAGOGASTRODUODENOSCOPY (EGD);  Surgeon: Pasty Spillers, MD;  Location: [facility] ENDOSCOPY;  Service: Endoscopy;  Laterality: N/A;   MANDIBLE FRACTURE SURGERY      Prior to Admission medications   Medication Sig Start Date End Date Taking? Authorizing Provider  acetaminophen (TYLENOL) 325 MG tablet Take 2 tablets (650 mg total) by mouth every 8 (eight) hours as needed for mild pain, moderate pain or fever. 04/01/21  Yes Hongalgi, Maximino Greenland, MD  folic acid (FOLVITE) 1 MG tablet Take 1 tablet (1 mg total) by mouth daily. 02/18/19  Yes Triplett, Cari B, FNP  Multiple Vitamins-Minerals (WOMENS 50+ MULTI VITAMIN/MIN PO) Take 1 tablet by mouth daily.   Yes [provider]  pantoprazole (PROTONIX) 40 MG tablet Take 1 tablet (40 mg total) by mouth daily. 04/01/21  Yes Hongalgi, Maximino Greenland, MD  thiamine 100 MG tablet Take 1 tablet (100 mg total) by mouth daily. 04/02/21  Yes Hongalgi, Maximino Greenland, MD  vitamin B-12 (CYANOCOBALAMIN) 1000 MCG tablet Take 1 tablet (1,000 mcg  total) by mouth daily. 03/15/21  Yes Marrion Coy, MD  ferrous sulfate 325 (65 FE) MG tablet Take 1 tablet (325 mg total) by mouth daily. 03/15/21 04/14/21  Marrion Coy, MD    Family History  Problem Relation Age of Onset   Hepatitis C Mother      Social History   Tobacco Use   Smoking status: Every Day    Packs/day: 1.00    Pack years:  0.00    Types: Cigarettes   Smokeless tobacco: Never  Vaping Use   Vaping Use: Some days  Substance Use Topics   Alcohol use: No    Comment: rarely    Drug use: No    Allergies as of 04/16/2021   (No Known Allergies)    Physical Examination:   BP 108/77   Pulse (!) 102   Temp 98.1 F (36.7 C) (Oral)   Wt 127 lb (57.6 kg)   BMI 18.75 kg/m   Constitutional: General:   Alert,  Well-developed, well-nourished, pleasant and cooperative in NAD BP 108/77   Pulse (!) 102   Temp 98.1 F (36.7 C) (Oral)   Wt 127 lb (57.6 kg)   BMI 18.75 kg/m   Eyes:  Sclera clear, no icterus.   Conjunctiva pink. PERRLA  Ears:  No scars, lesions or masses, Normal auditory acuity. Nose:  No deformity, discharge, or lesions. Mouth:  No deformity or lesions, oropharynx pink & moist.  Neck:  Supple; no masses or thyromegaly.  Respiratory: Normal respiratory effort, Normal percussion  Gastrointestinal:  Normal bowel sounds.  No bruits.  Soft, non-tender and non-distended without masses, hepatosplenomegaly or hernias noted.  No guarding or rebound tenderness.     Cardiac: No clubbing or edema.  No cyanosis. Normal posterior tibial pedal pulses noted.  Lymphatic:  No significant cervical or axillary adenopathy.  Psych:  Alert and cooperative. Normal mood and affect.  Musculoskeletal:  Normal gait. Head normocephalic, atraumatic. Symmetrical without gross deformities. 5/5 Upper and Lower extremity strength bilaterally.  Skin: Warm. Intact without significant lesions or rashes. No jaundice.  Neurologic:  Face symmetrical, tongue midline, Normal sensation to touch;  grossly normal neurologically.  Psych:  Alert and oriented x3, Alert and cooperative. Normal mood and affect.  Labs: CMP     Component Value Date/Time   NA 136 04/01/2021 0101   K 3.6 04/01/2021 0101   CL 104 04/01/2021 0101   CO2 25 04/01/2021 0101   GLUCOSE 85 04/01/2021 0101   BUN <5 (L) 04/01/2021 0101   CREATININE  0.57 04/01/2021 0101   CALCIUM 8.7 (L) 04/01/2021 0101   PROT 5.6 (L) 04/01/2021 0101   ALBUMIN 2.4 (L) 04/01/2021 0101   AST 39 04/01/2021 0101   ALT 25 04/01/2021 0101   ALKPHOS 112 04/01/2021 0101   BILITOT 0.9 04/01/2021 0101   GFRNONAA >60 04/01/2021 0101   GFRAA >60 03/17/2020 2215   Lab Results  Component Value Date   WBC 3.3 (L) 04/01/2021   HGB 9.5 (L) 04/01/2021   HCT 32.6 (L) 04/01/2021   MCV 91.6 04/01/2021   PLT 122 (L) 04/01/2021   IgG4 negative  Imaging Studies: MRI abdomen  IMPRESSION: 1. Hemorrhagic area of walled off necrosis is large and replaces head and uncinate process of the pancreas and is intimately associated with hypertrophied collateral pathways between the SMA and celiac. 2. Celiac flow may be dependent upon this pathway and this collateral network could be at risk of bleeding given proximity to the large area of walled  off necrosis. Close attention on follow-up is suggested. 3. Given that the collateral pathway may supply of much of the celiac flow, this is of importance should there be a need for future intervention. 4. Severe acute interstitial edematous pancreatitis involving the pancreatic body and tail. No ductal dilation. Note that given the proximity of the necrotic collection there is a risk for ductal interruption and the duct cannot be clearly tracked through the neck of the pancreas to the ampulla. 5. Extrinsic compression upon the common bile duct and signs of tumefactive sludge or stones in the gallbladder lumen. 6. Marked hepatic steatosis. Variable perfusion/enhancement of the liver could indicate developing liver disease or be related to above findings. No suspicious hepatic lesion. Trace ascites. 7. Mild splenomegaly.   CT abdomen IMPRESSION: 1. Extensive inflammation surrounding the pancreas, indicative of acute pancreatitis. Associated with this there is a large lesion in the head of the pancreas which is of mixed  attenuation. The appearance of this is favored to represent a pseudocyst, although the possibility of internal hemorrhage within the pseudocyst is suspected, particularly given the markedly dilated vessels in the surrounding pancreaticoduodenal arcade. Alternatively, a hemorrhagic mass is difficult to exclude, but is not strongly favored given the patient's young age. This could be further characterized with abdominal MRI with and without IV gadolinium with MRCP. 2. Cholelithiasis without evidence of acute cholecystitis at this time. 3. Hepatomegaly with severe hepatic steatosis. 4. Splenomegaly.   Assessment and Plan:   Sandra Pearson is a 35 y.o. y/o female here for follow-up of recent acute severe pancreatitis with hemorrhagic necrosis  Clinically, patient appears to be doing well with no further abdominal pain and good appetite and able to maintain good nutrition  However, in review of her previous records, patient was noted to surprisingly have better clinical symptoms than her imaging suggested during that admission.  Repeat imaging was recommended to be done as an outpatient on GI consult and discharge summary notes.  Agree with this and will order CT pancreatic protocol for further evaluation of the pancreas  Will refer patient to [facility] for recent severe pancreatitis, with hemorrhagic walled off necrosis  Given current clinical picture, will hold off on H. pylori treatment at this time  Will refer to hematology for iron deficiency anemia  Hold off on small bowel capsule study at this time as well  Continue close follow up in clinic  Patient encouraged to continue to abstain from alcohol.  This may have been the underlying culprit, although she does not admit to heavy drinking this time compared to her 2020 pancreatitis episode.  Clarithromycin also being avoided at this time  Dr Melodie Bouillon

## 2021-04-16 NOTE — Telephone Encounter (Signed)
Referral with notes, demographics etc faxed to [facility] fax [phone], phone [phone]

## 2021-04-20 NOTE — Progress Notes (Signed)
[facility]  [phone] Fax:([phone]  ID: [facility] [place] OB: 09/14/1986  MR#: [id number]  VZD#:[id number]  Patient Care Team: Patient, No Pcp Per (Inactive) as PCP - General (General Practice)  CHIEF COMPLAINT: Iron deficiency anemia.  INTERVAL HISTORY: Patient is a 35 year old female noted to have persistent iron deficiency anemia despite oral iron supplementation.  She currently feels well and is asymptomatic.  She has no neurologic complaints.  She denies any recent fevers or illnesses.  She has a good appetite and denies weight loss.  She has no chest pain, shortness of breath, cough, or hemoptysis.  She denies any nausea, vomiting, constipation, or diarrhea.  She has no melena or hematochezia.  She has no urinary complaints.  Patient feels at her baseline and offers no specific complaints today.  REVIEW OF SYSTEMS:   Review of Systems  Constitutional: Negative.  Negative for fever, malaise/fatigue and weight loss.  Respiratory: Negative.  Negative for cough, hemoptysis and shortness of breath.   Cardiovascular: Negative.  Negative for chest pain and leg swelling.  Gastrointestinal: Negative.  Negative for abdominal pain, blood in stool and melena.  Genitourinary: Negative.  Negative for hematuria.  Musculoskeletal: Negative.  Negative for back pain.  Skin: Negative.  Negative for rash.  Neurological: Negative.  Negative for dizziness, focal weakness, weakness and headaches.  Psychiatric/Behavioral: Negative.  The patient is not nervous/anxious.    As per HPI. Otherwise, a complete review of systems is negative.  PAST MEDICAL HISTORY: Past Medical History:  Diagnosis Date   Anemia    Hypertension     PAST SURGICAL HISTORY: Past Surgical History:  Procedure Laterality Date   COLONOSCOPY N/A 03/15/2021   Procedure: COLONOSCOPY;  Surgeon: Pasty Spillers, MD;  Location: [facility] ENDOSCOPY;  Service: Endoscopy;  Laterality: N/A;    ESOPHAGOGASTRODUODENOSCOPY N/A 03/15/2021   Procedure: ESOPHAGOGASTRODUODENOSCOPY (EGD);  Surgeon: Pasty Spillers, MD;  Location: [facility] ENDOSCOPY;  Service: Endoscopy;  Laterality: N/A;   MANDIBLE FRACTURE SURGERY      FAMILY HISTORY: Family History  Problem Relation Age of Onset   Hepatitis C Mother     ADVANCED DIRECTIVES (Y/N):  N  HEALTH MAINTENANCE: Social History   Tobacco Use   Smoking status: Every Day    Packs/day: 1.00    Pack years: 0.00    Types: Cigarettes   Smokeless tobacco: Never  Vaping Use   Vaping Use: Some days  Substance Use Topics   Alcohol use: No    Comment: rarely    Drug use: No     Colonoscopy:  PAP:  Bone density:  Lipid panel:  No Known Allergies  Current Outpatient Medications  Medication Sig Dispense Refill   acetaminophen (TYLENOL) 325 MG tablet Take 2 tablets (650 mg total) by mouth every 8 (eight) hours as needed for mild pain, moderate pain or fever.     folic acid (FOLVITE) 1 MG tablet Take 1 tablet (1 mg total) by mouth daily. 30 tablet 2   Multiple Vitamins-Minerals (WOMENS 50+ MULTI VITAMIN/MIN PO) Take 1 tablet by mouth daily.     pantoprazole (PROTONIX) 40 MG tablet Take 1 tablet (40 mg total) by mouth daily. 30 tablet 0   thiamine 100 MG tablet Take 1 tablet (100 mg total) by mouth daily. 30 tablet 0   vitamin B-12 (CYANOCOBALAMIN) 1000 MCG tablet Take 1 tablet (1,000 mcg total) by mouth daily. 30 tablet 0   ferrous sulfate 325 (65 FE) MG tablet Take 1 tablet (325 mg total)  by mouth daily. 30 tablet 0   No current facility-administered medications for this visit.    OBJECTIVE: Vitals:   04/23/21 1142  BP: 111/76  Pulse: 89  Resp: 20  Temp: (!) 97.2 F (36.2 C)     Body mass index is 19.71 kg/m.    ECOG FS:0 - Asymptomatic  General: Well-developed, well-nourished, no acute distress. Eyes: Pink conjunctiva, anicteric sclera. HEENT: Normocephalic, moist mucous membranes. Lungs: No audible wheezing or  coughing. Heart: Regular rate and rhythm. Abdomen: Soft, nontender, no obvious distention. Musculoskeletal: No edema, cyanosis, or clubbing. Neuro: Alert, answering all questions appropriately. Cranial nerves grossly intact. Skin: No rashes or petechiae noted. Psych: Normal affect. Lymphatics: No cervical, calvicular, axillary or inguinal LAD.   LAB RESULTS:  Lab Results  Component Value Date   NA 136 04/01/2021   K 3.6 04/01/2021   CL 104 04/01/2021   CO2 25 04/01/2021   GLUCOSE 85 04/01/2021   BUN <5 (L) 04/01/2021   CREATININE 0.57 04/01/2021   CALCIUM 8.7 (L) 04/01/2021   PROT 5.6 (L) 04/01/2021   ALBUMIN 2.4 (L) 04/01/2021   AST 39 04/01/2021   ALT 25 04/01/2021   ALKPHOS 112 04/01/2021   BILITOT 0.9 04/01/2021   GFRNONAA >60 04/01/2021   GFRAA >60 03/17/2020    Lab Results  Component Value Date   WBC 5.6 04/23/2021   NEUTROABS 2.4 03/15/2021   HGB 12.4 04/23/2021   HCT 40.6 04/23/2021   MCV 93.5 04/23/2021   PLT 262 04/23/2021   Lab Results  Component Value Date   IRON 199 (H) 04/23/2021   TIBC 395 04/23/2021   IRONPCTSAT 50 (H) 04/23/2021   Lab Results  Component Value Date   FERRITIN 23 04/23/2021     STUDIES: DG Chest 2 View  Result Date: 03/29/2021 CLINICAL DATA:  Chest pain EXAM: CHEST - 2 VIEW COMPARISON:  None. FINDINGS: The heart size and mediastinal contours are within normal limits. Both lungs are clear. The visualized skeletal structures are unremarkable. IMPRESSION: No active cardiopulmonary disease. Electronically Signed   By: Deatra Robinson M.D.   On: 03/29/2021 01:12   MR ABDOMEN W WO CONTRAST  Addendum Date: 03/29/2021   ADDENDUM REPORT: 03/29/2021 12:36 ADDENDUM: These results were called by telephone at the time of interpretation on 03/29/2021 at 12:36 pm to provider Lorretta Harp , who verbally acknowledged these results. Electronically Signed   By: Donzetta Kohut M.D.   On: 03/29/2021 12:36   Result Date: 03/29/2021 CLINICAL DATA:   35 year old female with abdominal pain and pancreatitis, history of masslike area in the head of the pancreas EXAM: MRI ABDOMEN WITHOUT AND WITH CONTRAST TECHNIQUE: Multiplanar multisequence MR imaging of the abdomen was performed both before and after the administration of intravenous contrast. CONTRAST:  5mL GADAVIST GADOBUTROL 1 MMOL/ML IV SOLN COMPARISON:  CT evaluation of March 29, 2021. FINDINGS: Lower chest: Lung bases without effusion or sign of consolidative change. Hepatobiliary: Hepatic steatosis with mildly heterogeneous perfusion of the liver. Cyst in the RIGHT hepatic lobe. No focal, suspicious hepatic lesion. The portal vein remains patent despite signs of pancreatitis. SMV is patent. Splenic vein is patent. Biliary tree with caliber change at the site of mass effect from the pancreatic lesion. Mild distension of the common bile duct up to 5 mm at this level. No signs of filling defect. Cholelithiasis or tumefactive sludge in the gallbladder. Pancreas: In the pancreatic head is a hemorrhagic collection without internal enhancement that measures 5.7 x 5.2 x 5.7 cm.  No visible pancreatic tissue is noted in the head or uncinate. The neck and tail of the pancreas shows normal intrinsic T1 signal. There is no ductal dilation of the dorsal pancreatic duct. There is extensive interstitial edematous change throughout the peripheral remaining pancreas and signs of peripancreatic stranding tracking into the root of the mesentery and the transverse mesocolon. Spleen: Spleen without focal, suspicious lesion. 13 cm greatest craniocaudal dimension. Adrenals/Urinary Tract: Adrenal glands are normal. No perinephric stranding. No hydronephrosis. Smooth renal contours. No suspicious renal lesion. Stomach/Bowel: No acute gastrointestinal process aside from presumed secondary bowel edema in the setting of severe acute interstitial edematous pancreatitis and signs of necrotic pancreatitis as above. Vascular/Lymphatic:  Hypertrophied GDA pathways bridging inferior pancreaticoduodenal collaterals to the SMA in the absence of stenosis of the SMA. There is some narrowing of the celiac that is suggested on prior CT imaging likely due to median arcuate ligament impression, this is likely a collateral network as result of this abnormality. Patent portal vein, splenic vein and SMV. No adenopathy Other: Trace ascites. Musculoskeletal: No suspicious bone lesions identified. IMPRESSION: 1. Hemorrhagic area of walled off necrosis is large and replaces head and uncinate process of the pancreas and is intimately associated with hypertrophied collateral pathways between the SMA and celiac. 2. Celiac flow may be dependent upon this pathway and this collateral network could be at risk of bleeding given proximity to the large area of walled off necrosis. Close attention on follow-up is suggested. 3. Given that the collateral pathway may supply of much of the celiac flow, this is of importance should there be a need for future intervention. 4. Severe acute interstitial edematous pancreatitis involving the pancreatic body and tail. No ductal dilation. Note that given the proximity of the necrotic collection there is a risk for ductal interruption and the duct cannot be clearly tracked through the neck of the pancreas to the ampulla. 5. Extrinsic compression upon the common bile duct and signs of tumefactive sludge or stones in the gallbladder lumen. 6. Marked hepatic steatosis. Variable perfusion/enhancement of the liver could indicate developing liver disease or be related to above findings. No suspicious hepatic lesion. Trace ascites. 7. Mild splenomegaly. A call is out to the referring provider to further discuss findings in the above case. Electronically Signed: By: Donzetta Kohut M.D. On: 03/29/2021 12:29   CT ABDOMEN PELVIS W CONTRAST  Result Date: 03/29/2021 CLINICAL DATA:  34 year old female with history of acute onset of nonlocalized  abdominal pain. EXAM: CT ABDOMEN AND PELVIS WITH CONTRAST TECHNIQUE: Multidetector CT imaging of the abdomen and pelvis was performed using the standard protocol following bolus administration of intravenous contrast. CONTRAST:  75mL OMNIPAQUE IOHEXOL 300 MG/ML  SOLN COMPARISON:  CT of the abdomen and pelvis 11/23/2018. FINDINGS: Lower chest: Small amount of subsegmental atelectasis in the right lower lobe. Hepatobiliary: Liver is enlarged measuring 24.8 cm in craniocaudal span. Heterogeneous areas of low attenuation are noted throughout the hepatic parenchyma, indicative of heterogeneous hepatic steatosis. No discrete cystic or solid hepatic lesions. No intra or extrahepatic biliary ductal dilatation. Gallbladder is nearly completely decompressed, but appears to contain several noncalcified gallstones. Pancreas: In the head of the pancreas (axial image 39 of series 2 and coronal image 30 of series 5) there is a large 5.4 x 4.8 x 5.6 cm rounded lesion of heterogeneous internal attenuation ranging from low to high attenuation. This exerts mass effect upon adjacent structures. Ectasia of the main pancreatic duct throughout the body and tail of the pancreas.  Inflammatory changes are noted surrounding the entire pancreas extending into the adjacent retroperitoneal fat. Spleen: Spleen is enlarged measuring 13.3 x 5.6 x 14.4 cm (estimated splenic volume of 536 mL) . Adrenals/Urinary Tract: Bilateral kidneys and adrenal glands are normal in appearance. No hydroureteronephrosis. Urinary bladder is normal in appearance. Stomach/Bowel: Appearance of the stomach is normal. There is no pathologic dilatation of small bowel or colon. Normal appendix. Vascular/Lymphatic: No significant atherosclerotic disease, aneurysm or dissection noted in the abdominal or pelvic vasculature. Superior mesenteric vein, splenic vein and portal vein are all patent. Prominent blood vessels surrounding the large lesion in the head of the pancreas,  most notably a very prominent pancreaticoduodenal artery arcade. No lymphadenopathy noted in the abdomen or pelvis. Reproductive: Uterus and ovaries are unremarkable in appearance. Other: No significant volume of ascites.  No pneumoperitoneum. Musculoskeletal: There are no aggressive appearing lytic or blastic lesions noted in the visualized portions of the skeleton. IMPRESSION: 1. Extensive inflammation surrounding the pancreas, indicative of acute pancreatitis. Associated with this there is a large lesion in the head of the pancreas which is of mixed attenuation. The appearance of this is favored to represent a pseudocyst, although the possibility of internal hemorrhage within the pseudocyst is suspected, particularly given the markedly dilated vessels in the surrounding pancreaticoduodenal arcade. Alternatively, a hemorrhagic mass is difficult to exclude, but is not strongly favored given the patient's young age. This could be further characterized with abdominal MRI with and without IV gadolinium with MRCP. 2. Cholelithiasis without evidence of acute cholecystitis at this time. 3. Hepatomegaly with severe hepatic steatosis. 4. Splenomegaly. Electronically Signed   By: Trudie Reed M.D.   On: 03/29/2021 06:47   MR 3D Recon At Scanner  Addendum Date: 03/29/2021   ADDENDUM REPORT: 03/29/2021 12:36 ADDENDUM: These results were called by telephone at the time of interpretation on 03/29/2021 at 12:36 pm to provider Lorretta Harp , who verbally acknowledged these results. Electronically Signed   By: Donzetta Kohut M.D.   On: 03/29/2021 12:36   Result Date: 03/29/2021 CLINICAL DATA:  35 year old female with abdominal pain and pancreatitis, history of masslike area in the head of the pancreas EXAM: MRI ABDOMEN WITHOUT AND WITH CONTRAST TECHNIQUE: Multiplanar multisequence MR imaging of the abdomen was performed both before and after the administration of intravenous contrast. CONTRAST:  5mL GADAVIST GADOBUTROL 1  MMOL/ML IV SOLN COMPARISON:  CT evaluation of March 29, 2021. FINDINGS: Lower chest: Lung bases without effusion or sign of consolidative change. Hepatobiliary: Hepatic steatosis with mildly heterogeneous perfusion of the liver. Cyst in the RIGHT hepatic lobe. No focal, suspicious hepatic lesion. The portal vein remains patent despite signs of pancreatitis. SMV is patent. Splenic vein is patent. Biliary tree with caliber change at the site of mass effect from the pancreatic lesion. Mild distension of the common bile duct up to 5 mm at this level. No signs of filling defect. Cholelithiasis or tumefactive sludge in the gallbladder. Pancreas: In the pancreatic head is a hemorrhagic collection without internal enhancement that measures 5.7 x 5.2 x 5.7 cm. No visible pancreatic tissue is noted in the head or uncinate. The neck and tail of the pancreas shows normal intrinsic T1 signal. There is no ductal dilation of the dorsal pancreatic duct. There is extensive interstitial edematous change throughout the peripheral remaining pancreas and signs of peripancreatic stranding tracking into the root of the mesentery and the transverse mesocolon. Spleen: Spleen without focal, suspicious lesion. 13 cm greatest craniocaudal dimension. Adrenals/Urinary Tract: Adrenal glands  are normal. No perinephric stranding. No hydronephrosis. Smooth renal contours. No suspicious renal lesion. Stomach/Bowel: No acute gastrointestinal process aside from presumed secondary bowel edema in the setting of severe acute interstitial edematous pancreatitis and signs of necrotic pancreatitis as above. Vascular/Lymphatic: Hypertrophied GDA pathways bridging inferior pancreaticoduodenal collaterals to the SMA in the absence of stenosis of the SMA. There is some narrowing of the celiac that is suggested on prior CT imaging likely due to median arcuate ligament impression, this is likely a collateral network as result of this abnormality. Patent portal vein,  splenic vein and SMV. No adenopathy Other: Trace ascites. Musculoskeletal: No suspicious bone lesions identified. IMPRESSION: 1. Hemorrhagic area of walled off necrosis is large and replaces head and uncinate process of the pancreas and is intimately associated with hypertrophied collateral pathways between the SMA and celiac. 2. Celiac flow may be dependent upon this pathway and this collateral network could be at risk of bleeding given proximity to the large area of walled off necrosis. Close attention on follow-up is suggested. 3. Given that the collateral pathway may supply of much of the celiac flow, this is of importance should there be a need for future intervention. 4. Severe acute interstitial edematous pancreatitis involving the pancreatic body and tail. No ductal dilation. Note that given the proximity of the necrotic collection there is a risk for ductal interruption and the duct cannot be clearly tracked through the neck of the pancreas to the ampulla. 5. Extrinsic compression upon the common bile duct and signs of tumefactive sludge or stones in the gallbladder lumen. 6. Marked hepatic steatosis. Variable perfusion/enhancement of the liver could indicate developing liver disease or be related to above findings. No suspicious hepatic lesion. Trace ascites. 7. Mild splenomegaly. A call is out to the referring provider to further discuss findings in the above case. Electronically Signed: By: Donzetta Kohut M.D. On: 03/29/2021 12:29    ASSESSMENT: Iron deficiency anemia.  PLAN:    Iron deficiency anemia: Appears to have resolved.  Patient's hemoglobin and iron stores are now within normal limits.  B12, folate, and reticulocyte count are all also within normal limits.  She does not require a IV Venofer at this time.  She has been instructed to continue oral iron supplementation.  Return to clinic in 3 months with repeat laboratory work, further evaluation, and consideration of treatment if  needed.  I spent a total of 45 minutes reviewing chart data, face-to-face evaluation with the patient, counseling and coordination of care as detailed above.   Patient expressed understanding and was in agreement with this plan. She also understands that She can call clinic at any time with any questions, concerns, or complaints.    Jeralyn Ruths, MD   04/25/2021 12:20 PM

## 2021-04-22 NOTE — Telephone Encounter (Signed)
error 

## 2021-04-23 ENCOUNTER — Encounter (INDEPENDENT_AMBULATORY_CARE_PROVIDER_SITE_OTHER): Payer: Self-pay

## 2021-04-23 ENCOUNTER — Encounter: Payer: Self-pay | Admitting: Oncology

## 2021-04-23 ENCOUNTER — Other Ambulatory Visit: Payer: Self-pay

## 2021-04-23 ENCOUNTER — Inpatient Hospital Stay: Payer: Self-pay

## 2021-04-23 ENCOUNTER — Inpatient Hospital Stay: Payer: Self-pay | Attending: Oncology | Admitting: Oncology

## 2021-04-23 VITALS — BP 111/76 | HR 89 | Temp 97.2°F | Resp 20 | Wt 133.5 lb

## 2021-04-23 DIAGNOSIS — D509 Iron deficiency anemia, unspecified: Secondary | ICD-10-CM

## 2021-04-23 DIAGNOSIS — I10 Essential (primary) hypertension: Secondary | ICD-10-CM

## 2021-04-23 DIAGNOSIS — F17210 Nicotine dependence, cigarettes, uncomplicated: Secondary | ICD-10-CM

## 2021-04-23 LAB — CBC
HCT: 40.6 % (ref 36.0–46.0)
Hemoglobin: 12.4 g/dL (ref 12.0–15.0)
MCH: 28.6 pg (ref 26.0–34.0)
MCHC: 30.5 g/dL (ref 30.0–36.0)
MCV: 93.5 fL (ref 80.0–100.0)
Platelets: 262 10*3/uL (ref 150–400)
RBC: 4.34 MIL/uL (ref 3.87–5.11)
RDW: 18.1 % — ABNORMAL HIGH (ref 11.5–15.5)
WBC: 5.6 10*3/uL (ref 4.0–10.5)
nRBC: 0 % (ref 0.0–0.2)

## 2021-04-23 LAB — RETICULOCYTES
Immature Retic Fract: 11.3 % (ref 2.3–15.9)
RBC.: 4.36 MIL/uL (ref 3.87–5.11)
Retic Count, Absolute: 67.6 10*3/uL (ref 19.0–186.0)
Retic Ct Pct: 1.6 % (ref 0.4–3.1)

## 2021-04-23 LAB — IRON AND TIBC
Iron: 199 ug/dL — ABNORMAL HIGH (ref 28–170)
Saturation Ratios: 50 % — ABNORMAL HIGH (ref 10.4–31.8)
TIBC: 395 ug/dL (ref 250–450)
UIBC: 196 ug/dL

## 2021-04-23 LAB — FERRITIN: Ferritin: 23 ng/mL (ref 11–307)

## 2021-04-23 LAB — LACTATE DEHYDROGENASE: LDH: 112 U/L (ref 98–192)

## 2021-04-23 LAB — FOLATE: Folate: 44 ng/mL (ref >5.9)

## 2021-04-24 LAB — HAPTOGLOBIN: Haptoglobin: 60 mg/dL (ref 33–278)

## 2021-04-24 NOTE — Progress Notes (Signed)
The following Medication: Venofer has been approved thru [place] as Assistance Program. Enrollment period is 04/24/2021 to 07/25/2021.  Assistance ID: [id number]. Reason for Assistance: Self  First DOS: 05/03/2021  Virgina Jock, CPhT IV Drug Replacement Specialist  [facility] Phone: [phone]

## 2021-04-25 ENCOUNTER — Encounter: Payer: Self-pay | Admitting: Oncology

## 2021-04-25 LAB — VITAMIN B12: Vitamin B-12: 1040 pg/mL — ABNORMAL HIGH (ref 180–914)

## 2021-04-30 ENCOUNTER — Ambulatory Visit
Admission: RE | Admit: 2021-04-30 | Discharge: 2021-04-30 | Disposition: A | Discharge status: Home / Self Care | Payer: Self-pay | Source: Ambulatory Visit | Attending: Gastroenterology | Admitting: Gastroenterology

## 2021-04-30 ENCOUNTER — Ambulatory Visit: Admission: RE | Admit: 2021-04-30 | Payer: MEDICAID | Source: Ambulatory Visit | Admitting: Gastroenterology

## 2021-04-30 ENCOUNTER — Other Ambulatory Visit: Payer: Self-pay

## 2021-04-30 DIAGNOSIS — K859 Acute pancreatitis without necrosis or infection, unspecified: Secondary | ICD-10-CM | POA: Insufficient documentation

## 2021-04-30 MED ORDER — IOHEXOL 300 MG/ML  SOLN
100.0000 mL | Freq: Once | INTRAMUSCULAR | Status: AC | PRN
  Administered 2021-04-30: 100 mL via INTRAVENOUS

## 2021-05-02 ENCOUNTER — Inpatient Hospital Stay: Payer: Self-pay | Attending: Oncology

## 2021-05-02 VITALS — BP 102/74 | HR 88 | Temp 96.2°F | Resp 20

## 2021-05-02 DIAGNOSIS — D509 Iron deficiency anemia, unspecified: Secondary | ICD-10-CM | POA: Insufficient documentation

## 2021-05-02 MED ORDER — SODIUM CHLORIDE 0.9 % IV SOLN: 200.0000 mg | Freq: Once | INTRAVENOUS | Status: DC

## 2021-05-02 MED ORDER — SODIUM CHLORIDE 0.9 % IV SOLN
Freq: Once | INTRAVENOUS | Status: AC
Start: 2021-05-02 — End: 2021-05-02
  Filled 2021-05-02: qty 250

## 2021-05-02 MED ORDER — IRON SUCROSE 20 MG/ML IV SOLN
200.0000 mg | Freq: Once | INTRAVENOUS | Status: AC
  Administered 2021-05-02: 200 mg via INTRAVENOUS
  Filled 2021-05-02: qty 10

## 2021-05-02 NOTE — Patient Instructions (Signed)
[facility] [place] [facility]  Discharge Instructions: Thank you for choosing [facility] to provide your oncology and hematology care.  If you have a lab appointment with the [facility], please go directly to the [facility] and check in at the registration area.  Wear comfortable clothing and clothing appropriate for easy access to any Portacath or PICC line.   We strive to give you quality time with your provider. You may need to reschedule your appointment if you arrive late (15 or more minutes).  Arriving late affects you and other patients whose appointments are after yours.  Also, if you miss three or more appointments without notifying the office, you may be dismissed from the clinic at the provider's discretion.      For prescription refill requests, have your pharmacy contact our office and allow 72 hours for refills to be completed.    Today you received the following : Venofer   To help prevent nausea and vomiting after your treatment, we encourage you to take your nausea medication as directed.  BELOW ARE SYMPTOMS THAT SHOULD BE REPORTED IMMEDIATELY: . *FEVER GREATER THAN 100.4 F (38 C) OR HIGHER . *CHILLS OR SWEATING . *NAUSEA AND VOMITING THAT IS NOT CONTROLLED WITH YOUR NAUSEA MEDICATION . *UNUSUAL SHORTNESS OF BREATH . *UNUSUAL BRUISING OR BLEEDING . *URINARY PROBLEMS (pain or burning when urinating, or frequent urination) . *BOWEL PROBLEMS (unusual diarrhea, constipation, pain near the anus) . TENDERNESS IN MOUTH AND THROAT WITH OR WITHOUT PRESENCE OF ULCERS (sore throat, sores in mouth, or a toothache) . UNUSUAL RASH, SWELLING OR PAIN  . UNUSUAL VAGINAL DISCHARGE OR ITCHING   Items with * indicate a potential emergency and should be followed up as soon as possible or go to the Emergency Department if any problems should occur.  Please show the CHEMOTHERAPY ALERT CARD or IMMUNOTHERAPY ALERT CARD at check-in to the Emergency  Department and triage nurse.  Should you have questions after your visit or need to cancel or reschedule your appointment, please contact [facility]  [phone] and follow the prompts.  Office hours are 8:00 a.m. to 4:30 p.m. Monday - Friday. Please note that voicemails left after 4:00 p.m. may not be returned until the following business day.  We are closed weekends and major holidays. You have access to a nurse at all times for urgent questions. Please call the main number to the clinic [phone] and follow the prompts.  For any non-urgent questions, you may also contact your provider using MyChart. We now offer e-Visits for anyone 18 and older to request care online for non-urgent symptoms. For details visit [email or web address].   Also download the MyChart app! Go to the app store, search "MyChart", open the app, select [facility], and log in with your MyChart username and password.  Due to Covid, a mask is required upon entering the hospital/clinic. If you do not have a mask, one will be given to you upon arrival. For doctor visits, patients may have 1 support person aged 18 or older with them. For treatment visits, patients cannot have anyone with them due to current Covid guidelines and our immunocompromised population.  

## 2021-05-02 NOTE — Progress Notes (Signed)
Pt here for venofer.  Per Dr Irving Copas note on 6/280 no IV venofer needed and to continue oral iron.  Per Abelardo Diesel- she states she received secure message from MD to schedule IV iron.  MD not available to clarify.  Per Dr Smith Robert- give one dose today as her levels have actually improved and clarify with Dr Orlie Dakin before her next appt. Pt aware.

## 2021-05-06 ENCOUNTER — Encounter: Payer: Self-pay | Admitting: Oncology

## 2021-05-06 ENCOUNTER — Inpatient Hospital Stay: Payer: Self-pay

## 2021-05-08 ENCOUNTER — Ambulatory Visit: Payer: Self-pay

## 2021-05-10 ENCOUNTER — Ambulatory Visit: Payer: Self-pay

## 2021-05-13 ENCOUNTER — Ambulatory Visit: Payer: Self-pay | Admitting: Gastroenterology

## 2021-05-13 ENCOUNTER — Encounter: Payer: Self-pay | Admitting: Nurse Practitioner

## 2021-05-14 ENCOUNTER — Encounter: Payer: Self-pay | Admitting: Nurse Practitioner

## 2021-05-14 ENCOUNTER — Other Ambulatory Visit: Payer: Self-pay

## 2021-05-14 ENCOUNTER — Ambulatory Visit: Payer: Self-pay | Attending: Nurse Practitioner | Admitting: Nurse Practitioner

## 2021-05-14 DIAGNOSIS — K859 Acute pancreatitis without necrosis or infection, unspecified: Secondary | ICD-10-CM

## 2021-05-14 DIAGNOSIS — Z7689 Persons encountering health services in other specified circumstances: Secondary | ICD-10-CM

## 2021-05-14 DIAGNOSIS — D649 Anemia, unspecified: Secondary | ICD-10-CM

## 2021-05-14 MED ORDER — FOLIC ACID 1 MG PO TABS: 1.0000 mg | ORAL_TABLET | Freq: Every day | ORAL | 3 refills | Status: DC

## 2021-05-14 NOTE — Progress Notes (Signed)
Virtual Visit Note Due to national recommendations of social distancing due to [place] 19, virtual visit is felt to be most appropriate for this patient at this time.  I discussed the limitations, risks, security and privacy concerns of performing an evaluation and management service by video and the availability of in person appointments. I also discussed with the patient that there may be a patient responsible charge related to this service. The patient expressed understanding and agreed to proceed.    I connected with Dixon Boos on 05/14/21  at   2:30 PM EDT  EDT by VIDEO and verified that I am speaking with the correct person using two identifiers.   Location of Patient: Private Residence   Location of Provider: [place] [employer] Office    Persons participating in VIRTUAL visit: Geryl Rankins FNP-BC [place]    History of Present Illness: VIRTUAL visit for: Establish care/HFU She has a past medical history of Anemia (iron and B12 def), GERD with esophagitis, hiatal hernia, Tobacco and ETOH use disorder, ETOH induced acute pancreatitis, H pylori, and Hypertension.   Patient has been counseled on age-appropriate routine health concerns for screening and prevention. These are reviewed and up-to-date. Referrals have been placed accordingly. Immunizations are up-to-date or declined.     Mammogram; Referred to the breast clinic PAP smear: scheduled for next office visit Colonoscopy: 02-2021  Admitted to the hospital last month with severe pancreatitis with hemorrhagic necrosis and abnormal vasculature surrounding the pancreas and celiac vessels. Cause of pancreatitis was unclear. PER GI NOTE: She did drink alcohol prior to the hospital stay, but only reported alcohol use every 2 to 3 days, for a few days in a row, no more than 2-3 drinks at a time.  She had been recently started on clarithromycin for H. pylori seen on gastric biopsies and since  this is a class III drug for causing pancreatitis (with class I and II drugs having the greatest potential for causing pancreatitis), this was discontinued and has not been restarted Due to complicated disease process it was recommended that she be referred to [facility] care for MRI showing hemorrhagic area of walled off necrosis which was large, replacing head and uncinate process of the pancreas, intimately associated with hypertrophied collateral pathways between the SMA and celiac.  Severe acute interstitial edematous pancreatitis was reported.)   Today she states she is feeling well. Able to tolerate solids. She is trying to avoid foods high in fat and cholesterol. She has been referred to [facility] for severe pancreatitis with hemorrhagic walled off necrosis.   She has also been referred to Hematology for IDA. Continues on ferrous sulfate, B12, Folate and Thiamine.  Past Medical History:  Diagnosis Date   Anemia    Hypertension     Past Surgical History:  Procedure Laterality Date   COLONOSCOPY N/A 03/15/2021   Procedure: COLONOSCOPY;  Surgeon: Virgel Manifold, MD;  Location: [facility] ENDOSCOPY;  Service: Endoscopy;  Laterality: N/A;   ESOPHAGOGASTRODUODENOSCOPY N/A 03/15/2021   Procedure: ESOPHAGOGASTRODUODENOSCOPY (EGD);  Surgeon: Virgel Manifold, MD;  Location: [facility] ENDOSCOPY;  Service: Endoscopy;  Laterality: N/A;   MANDIBLE FRACTURE SURGERY      Family History  Problem Relation Age of Onset   Hepatitis C Mother     Social History   Socioeconomic History   Marital status: Married    Spouse name: Not on file   Number of children: Not on file   Years of education: Not on  file   Highest education level: Not on file  Occupational History   Not on file  Tobacco Use   Smoking status: Every Day    Packs/day: 1.00    Types: Cigarettes   Smokeless tobacco: Never  Vaping Use   Vaping Use: Some days  Substance and Sexual Activity   Alcohol use: No     Comment: rarely    Drug use: No   Sexual activity: Yes  Other Topics Concern   Not on file  Social History Narrative   Not on file   Social Determinants of Health   Financial Resource Strain: Not on file  Food Insecurity: Not on file  Transportation Needs: Not on file  Physical Activity: Not on file  Stress: Not on file  Social Connections: Not on file     Observations/Objective: Awake, alert and oriented x 3   Review of Systems  Constitutional:  Negative for fever, malaise/fatigue and weight loss.  HENT: Negative.  Negative for nosebleeds.   Eyes: Negative.  Negative for blurred vision, double vision and photophobia.  Respiratory: Negative.  Negative for cough and shortness of breath.   Cardiovascular: Negative.  Negative for chest pain, palpitations and leg swelling.  Gastrointestinal: Negative.  Negative for heartburn, nausea and vomiting.  Musculoskeletal: Negative.  Negative for myalgias.  Neurological: Negative.  Negative for dizziness, focal weakness, seizures and headaches.  Psychiatric/Behavioral: Negative.  Negative for suicidal ideas.    Assessment and Plan: Diagnoses and all orders for this visit:  Encounter to establish care  Severe anemia -     folic acid (FOLVITE) 1 MG tablet; Take 1 tablet (1 mg total) by mouth daily. -     CBC with Differential; Future  Hypomagnesemia -     Magnesium; Future  Severe acute pancreatitis -     CMP14+EGFR; Future -     Lipase; Future  Phosphorus metabolic disorder -     Phosphorus; Future    Follow Up Instructions Return for labs this week. Next appt PAP SMEAR.     I discussed the assessment and treatment plan with the patient. The patient was provided an opportunity to ask questions and all were answered. The patient agreed with the plan and demonstrated an understanding of the instructions.   The patient was advised to call back or seek an in-person evaluation if the symptoms worsen or if the condition fails to  improve as anticipated.  I provided 20 minutes of face-to-face time during this encounter including median intraservice time, reviewing previous notes, labs, imaging, medications and explaining diagnosis and management.  Gildardo Pounds, FNP-BC

## 2021-05-15 NOTE — Addendum Note (Signed)
Addended byMemory Dance on: 05/15/2021 09:18 AM   Modules accepted: Orders

## 2021-05-16 ENCOUNTER — Other Ambulatory Visit: Payer: Self-pay | Admitting: Nurse Practitioner

## 2021-05-16 DIAGNOSIS — R7989 Other specified abnormal findings of blood chemistry: Secondary | ICD-10-CM

## 2021-05-16 LAB — PHOSPHORUS: Phosphorus: 5.2 mg/dL — ABNORMAL HIGH (ref 3.0–4.3)

## 2021-05-16 LAB — CBC WITH DIFFERENTIAL/PLATELET
Basophils Absolute: 0 10*3/uL (ref 0.0–0.2)
Basos: 1 %
EOS (ABSOLUTE): 0.3 10*3/uL (ref 0.0–0.4)
Eos: 5 %
Hematocrit: 40.9 % (ref 34.0–46.6)
Hemoglobin: 12.8 g/dL (ref 11.1–15.9)
Immature Grans (Abs): 0 10*3/uL (ref 0.0–0.1)
Immature Granulocytes: 0 %
Lymphocytes Absolute: 2.1 10*3/uL (ref 0.7–3.1)
Lymphs: 38 %
MCH: 28.8 pg (ref 26.6–33.0)
MCHC: 31.3 g/dL — ABNORMAL LOW (ref 31.5–35.7)
MCV: 92 fL (ref 79–97)
Monocytes Absolute: 0.6 10*3/uL (ref 0.1–0.9)
Monocytes: 10 %
Neutrophils Absolute: 2.4 10*3/uL (ref 1.4–7.0)
Neutrophils: 46 %
Platelets: 236 10*3/uL (ref 150–450)
RBC: 4.45 x10E6/uL (ref 3.77–5.28)
RDW: 14.6 % (ref 11.7–15.4)
WBC: 5.4 10*3/uL (ref 3.4–10.8)

## 2021-05-16 LAB — CMP14+EGFR
ALT: 16 IU/L (ref 0–32)
AST: 28 IU/L (ref 0–40)
Albumin/Globulin Ratio: 1.8 (ref 1.2–2.2)
Albumin: 4.4 g/dL (ref 3.8–4.8)
Alkaline Phosphatase: 81 IU/L (ref 44–121)
BUN/Creatinine Ratio: 22 (ref 9–23)
BUN: 13 mg/dL (ref 6–20)
Bilirubin Total: 0.5 mg/dL (ref 0.0–1.2)
CO2: 27 mmol/L (ref 20–29)
Calcium: 9.8 mg/dL (ref 8.7–10.2)
Chloride: 100 mmol/L (ref 96–106)
Creatinine, Ser: 0.58 mg/dL (ref 0.57–1.00)
Globulin, Total: 2.5 g/dL (ref 1.5–4.5)
Glucose: 82 mg/dL (ref 65–99)
Potassium: 4.8 mmol/L (ref 3.5–5.2)
Sodium: 137 mmol/L (ref 134–144)
Total Protein: 6.9 g/dL (ref 6.0–8.5)
eGFR: 122 mL/min/{1.73_m2} (ref >59)

## 2021-05-16 LAB — MAGNESIUM: Magnesium: 1.9 mg/dL (ref 1.6–2.3)

## 2021-05-16 LAB — LIPASE: Lipase: 18 U/L (ref 14–72)

## 2021-05-21 ENCOUNTER — Ambulatory Visit: Payer: Self-pay

## 2021-05-27 DIAGNOSIS — K8591 Acute pancreatitis with uninfected necrosis, unspecified: Secondary | ICD-10-CM

## 2021-05-27 DIAGNOSIS — A048 Other specified bacterial intestinal infections: Secondary | ICD-10-CM

## 2021-05-27 HISTORY — DX: Acute pancreatitis with uninfected necrosis, unspecified: K85.91

## 2021-05-27 HISTORY — DX: Other specified bacterial intestinal infections: A04.8

## 2021-05-28 ENCOUNTER — Encounter: Payer: Self-pay | Admitting: Oncology

## 2021-05-28 ENCOUNTER — Other Ambulatory Visit: Payer: Self-pay

## 2021-05-28 ENCOUNTER — Ambulatory Visit (INDEPENDENT_AMBULATORY_CARE_PROVIDER_SITE_OTHER): Payer: Self-pay | Admitting: Gastroenterology

## 2021-05-28 VITALS — BP 114/79 | HR 85 | Temp 98.5°F | Wt 136.8 lb

## 2021-05-28 DIAGNOSIS — D509 Iron deficiency anemia, unspecified: Secondary | ICD-10-CM

## 2021-05-28 DIAGNOSIS — K8591 Acute pancreatitis with uninfected necrosis, unspecified: Secondary | ICD-10-CM

## 2021-05-28 NOTE — Progress Notes (Signed)
Melodie Bouillon, MD [address]  [address]  [place], [place] [address]  Main: [phone]  Fax: [phone]   Primary Care Physician: Claiborne Rigg, NP   Chief Complaint  Patient presents with   Follow-up    HPI: Sandra Pearson is a 35 y.o. female with previous history of iron deficiency anemia, and recent pancreatitis here for follow-up.  Patient reports doing well, with good appetite.  No nausea or vomiting or abdominal pain.  Denies any weight loss.  No dysphagia.  No heartburn.  No altered bowel habits.  No blood in stool.  Denies any recent alcohol intake and states is staying away from alcohol.  However, continues to smoke.  Patient was previously referred to [facility] and as per [facility], they tried to contact the patient but the patient has not answered their calls.  As per the patient, she tried to call them and she gets transferred to multiple extensions and is not able to reach a person to make an appointment.  She sees hematology and is receiving IV iron transfusions as needed and hemoglobin has normalized.  Previous history:  As per GI notes, The etiology of her pancreatitis was not entirely clear.  She did drink alcohol prior to the hospital stay, but only reported alcohol use every 2 to 3 days, for a few days in a row, no more than 2-3 drinks at a time.  She had been recently started on clarithromycin for H. pylori seen on gastric biopsies and since this is a class III drug for causing pancreatitis (with class I and II drugs having the greatest potential for causing pancreatitis), this was discontinued and has not been restarted.  As per the GI consult note from that admission, patient had previous pancreatitis alcohol-related in 2020.  Patient was initially admitted to [facility] but had to be transferred to [facility] for tertiary care due to significant abnormalities on MRI showing hemorrhagic area of walled off necrosis which was large, replacing head and  uncinate process of the pancreas, intimately associated with hypertrophied collateral pathways between the SMA and celiac.  Severe acute interstitial edematous pancreatitis was reported.  Patient was evaluated by vascular surgery at [facility] and was conservatively managed.  Recommendations were for quaternary care referral as an outpatient due to complicated disease and possible complications from interventions that are better handled at the centers.    ROS: All ROS reviewed and negative except as per HPI   Past Medical History:  Diagnosis Date   Anemia    Hypertension     Past Surgical History:  Procedure Laterality Date   COLONOSCOPY N/A 03/15/2021   Procedure: COLONOSCOPY;  Surgeon: Pasty Spillers, MD;  Location: [facility] ENDOSCOPY;  Service: Endoscopy;  Laterality: N/A;   ESOPHAGOGASTRODUODENOSCOPY N/A 03/15/2021   Procedure: ESOPHAGOGASTRODUODENOSCOPY (EGD);  Surgeon: Pasty Spillers, MD;  Location: [facility] ENDOSCOPY;  Service: Endoscopy;  Laterality: N/A;   MANDIBLE FRACTURE SURGERY      Prior to Admission medications   Medication Sig Start Date End Date Taking? Authorizing Provider  acetaminophen (TYLENOL) 325 MG tablet Take 2 tablets (650 mg total) by mouth every 8 (eight) hours as needed for mild pain, moderate pain or fever. 04/01/21  Yes Hongalgi, Maximino Greenland, MD  folic acid (FOLVITE) 1 MG tablet Take 1 tablet (1 mg total) by mouth daily. 05/14/21 08/12/21 Yes Claiborne Rigg, NP  Multiple Vitamins-Minerals (WOMENS 50+ MULTI VITAMIN/MIN PO) Take 1 tablet by mouth daily.   Yes [provider]  thiamine 100 MG tablet Take 1 tablet (100 mg total) by mouth daily. 04/02/21  Yes Hongalgi, Maximino Greenland, MD  vitamin B-12 (CYANOCOBALAMIN) 1000 MCG tablet Take 1 tablet (1,000 mcg total) by mouth daily. 03/15/21  Yes Marrion Coy, MD  ferrous sulfate 325 (65 FE) MG tablet Take 1 tablet (325 mg total) by mouth daily. 03/15/21 04/14/21  Marrion Coy, MD    Family History  Problem  Relation Age of Onset   Hepatitis C Mother      Social History   Tobacco Use   Smoking status: Every Day    Packs/day: 1.00    Types: Cigarettes   Smokeless tobacco: Never  Vaping Use   Vaping Use: Some days  Substance Use Topics   Alcohol use: No    Comment: rarely    Drug use: No    Allergies as of 05/28/2021   (No Known Allergies)    Physical Examination:  Constitutional: General:   Alert,  Well-developed, well-nourished, pleasant and cooperative in NAD BP 114/79   Pulse 85   Temp 98.5 F (36.9 C) (Oral)   Wt 136 lb 12.8 oz (62.1 kg)   BMI 20.20 kg/m   Respiratory: Normal respiratory effort  Gastrointestinal:  Soft, non-tender and non-distended without masses, hepatosplenomegaly or hernias noted.  No guarding or rebound tenderness.     Cardiac: No clubbing or edema.  No cyanosis. Normal posterior tibial pedal pulses noted.  Psych:  Alert and cooperative. Normal mood and affect.  Musculoskeletal:  Normal gait. Head normocephalic, atraumatic. Symmetrical without gross deformities. 5/5 Lower extremity strength bilaterally.  Skin: Warm. Intact without significant lesions or rashes. No jaundice.  Neck: Supple, trachea midline  Lymph: No cervical lymphadenopathy  Psych:  Alert and oriented x3, Alert and cooperative. Normal mood and affect.  Labs: CMP     Component Value Date/Time   NA 137 05/15/2021 0920   K 4.8 05/15/2021 0920   CL 100 05/15/2021 0920   CO2 27 05/15/2021 0920   GLUCOSE 82 05/15/2021 0920   GLUCOSE 85 04/01/2021 0101   BUN 13 05/15/2021 0920   CREATININE 0.58 05/15/2021 0920   CALCIUM 9.8 05/15/2021 0920   PROT 6.9 05/15/2021 0920   ALBUMIN 4.4 05/15/2021 0920   AST 28 05/15/2021 0920   ALT 16 05/15/2021 0920   ALKPHOS 81 05/15/2021 0920   BILITOT 0.5 05/15/2021 0920   GFRNONAA >60 04/01/2021 0101   GFRAA >60 03/17/2020 2215   Lab Results  Component Value Date   WBC 5.4 05/15/2021   HGB 12.8 05/15/2021   HCT 40.9 05/15/2021    MCV 92 05/15/2021   PLT 236 05/15/2021    Imaging Studies:   Assessment and Plan:   Sandra Pearson is a 35 y.o. y/o female here for follow-up of iron deficiency anemia and pancreatitis  Clinically her pancreatitis symptoms have resolved.  Her most recent CT pancreas on May 01, 2021 showed interval decrease in the size of the fluid collection, and findings consistent with resolution of acute pancreatitis and a small, resolving pseudocyst sequela of walled off necrosis.  Patient is awaiting [facility] appointment.  We will assist with this by calling [facility] in seeing if we can facilitate with her appointment.  I have encouraged her to try to call them again to make an appointment as referral has already been placed  Continue follow-up with hematology and anemia has normalized with IV iron transfusions  Will hold off on treatment for H. pylori at this  time and readdress after official consult with [facility] for her episode of severe pancreatitis with significant abnormalities on CT scan in June 2022  Continue close follow-up in clinic we discussed appropriate timeline for H. pylori treatment  Small bowel capsule study for evaluation of iron deficiency anemia may need to be considered in the future.  However, this has resolved at this time with IV iron transfusions and no indication for urgent small bowel capsule study given priority of addressing her previous episode of pancreatitis and [facility] referral at this time  Dr Melodie Bouillon

## 2021-06-05 ENCOUNTER — Telehealth: Payer: Self-pay

## 2021-06-05 NOTE — Telephone Encounter (Signed)
Called [facility] to see if patient was scheduled for her referral. She states that they have tried to call patient but she has not returned there call. Called the patient and informed patient that they have been trying to call her to set up appointment. Gave her [facility] phone number to schedule the appointment which is [phone]

## 2021-06-07 ENCOUNTER — Encounter: Payer: Self-pay | Admitting: Oncology

## 2021-06-11 ENCOUNTER — Encounter: Payer: Self-pay | Admitting: Oncology

## 2021-06-23 ENCOUNTER — Other Ambulatory Visit: Payer: Self-pay

## 2021-06-23 DIAGNOSIS — F17210 Nicotine dependence, cigarettes, uncomplicated: Secondary | ICD-10-CM | POA: Insufficient documentation

## 2021-06-23 DIAGNOSIS — I10 Essential (primary) hypertension: Secondary | ICD-10-CM | POA: Insufficient documentation

## 2021-06-23 DIAGNOSIS — K859 Acute pancreatitis without necrosis or infection, unspecified: Secondary | ICD-10-CM | POA: Insufficient documentation

## 2021-06-23 LAB — LIPASE, BLOOD: Lipase: 105 U/L — ABNORMAL HIGH (ref 11–51)

## 2021-06-23 LAB — COMPREHENSIVE METABOLIC PANEL
ALT: 22 U/L (ref 0–44)
AST: 32 U/L (ref 15–41)
Albumin: 4.2 g/dL (ref 3.5–5.0)
Alkaline Phosphatase: 82 U/L (ref 38–126)
Anion gap: 8 (ref 5–15)
BUN: 10 mg/dL (ref 6–20)
CO2: 28 mmol/L (ref 22–32)
Calcium: 9.8 mg/dL (ref 8.9–10.3)
Chloride: 99 mmol/L (ref 98–111)
Creatinine, Ser: 0.68 mg/dL (ref 0.44–1.00)
GFR, Estimated: >60 mL/min (ref >60)
Glucose, Bld: 106 mg/dL — ABNORMAL HIGH (ref 70–99)
Potassium: 4.2 mmol/L (ref 3.5–5.1)
Sodium: 135 mmol/L (ref 135–145)
Total Bilirubin: 0.8 mg/dL (ref 0.3–1.2)
Total Protein: 7.4 g/dL (ref 6.5–8.1)

## 2021-06-23 LAB — CBC
HCT: 42.1 % (ref 36.0–46.0)
Hemoglobin: 14.3 g/dL (ref 12.0–15.0)
MCH: 30.3 pg (ref 26.0–34.0)
MCHC: 34 g/dL (ref 30.0–36.0)
MCV: 89.2 fL (ref 80.0–100.0)
Platelets: 203 10*3/uL (ref 150–400)
RBC: 4.72 MIL/uL (ref 3.87–5.11)
RDW: 13.1 % (ref 11.5–15.5)
WBC: 5.2 10*3/uL (ref 4.0–10.5)
nRBC: 0 % (ref 0.0–0.2)

## 2021-06-23 NOTE — ED Triage Notes (Signed)
Pt states bilateral upper quadrant pain that began this afternoon. Pt with history of pancreatitis. Pt states she does have nausea. Pt appears in no acute distress. Pt denies fever, diarrhea.

## 2021-06-24 ENCOUNTER — Emergency Department
Admission: EM | Admit: 2021-06-24 | Discharge: 2021-06-24 | Disposition: A | Discharge status: Home / Self Care | Payer: Self-pay | Attending: Emergency Medicine | Admitting: Emergency Medicine

## 2021-06-24 ENCOUNTER — Emergency Department: Payer: Self-pay

## 2021-06-24 DIAGNOSIS — K8020 Calculus of gallbladder without cholecystitis without obstruction: Secondary | ICD-10-CM

## 2021-06-24 DIAGNOSIS — K859 Acute pancreatitis without necrosis or infection, unspecified: Secondary | ICD-10-CM

## 2021-06-24 DIAGNOSIS — R1013 Epigastric pain: Secondary | ICD-10-CM

## 2021-06-24 MED ORDER — HYDROMORPHONE HCL 1 MG/ML IJ SOLN
0.5000 mg | Freq: Once | INTRAMUSCULAR | Status: AC
  Administered 2021-06-24: 0.5 mg via INTRAVENOUS
  Filled 2021-06-24: qty 1

## 2021-06-24 MED ORDER — ONDANSETRON HCL 4 MG/2ML IJ SOLN
4.0000 mg | Freq: Once | INTRAMUSCULAR | Status: AC
  Administered 2021-06-24: 4 mg via INTRAVENOUS
  Filled 2021-06-24: qty 2

## 2021-06-24 MED ORDER — ONDANSETRON 4 MG PO TBDP: 4.0000 mg | ORAL_TABLET | Freq: Three times a day (TID) | ORAL | 0 refills | Status: DC | PRN

## 2021-06-24 MED ORDER — SODIUM CHLORIDE 0.9 % IV BOLUS
1000.0000 mL | Freq: Once | INTRAVENOUS | Status: AC
  Administered 2021-06-24: 1000 mL via INTRAVENOUS

## 2021-06-24 MED ORDER — OXYCODONE-ACETAMINOPHEN 5-325 MG PO TABS: 1.0000 | ORAL_TABLET | ORAL | 0 refills | Status: DC | PRN

## 2021-06-24 NOTE — ED Provider Notes (Signed)
[facility] Emergency Department Provider Note   ____________________________________________   Event Date/Time   First MD Initiated Contact with Patient 06/24/21 [phone]     (approximate)  I have reviewed the triage vital signs and the nursing notes.   HISTORY  Chief Complaint Abdominal Pain    HPI Sandra Pearson is a 35 y.o. female who presents to the ED from home with a chief complaint of upper abdominal pain with nausea.  Symptoms began approximately 1 PM after eating Mexican rice and tortillas.  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  Follow-up imaging in July demonstrated decrease in peripancreatic fluid.  Patient had appointment with [facility] last week and told she needs surgery referral for gallstones seen on CT scan.  She is trying to set that up through her local GI doctor.  Denies fever, cough, chest pain, shortness of breath, vomiting or diarrhea.  States symptoms are not nearly as bad as when she was admitted with necrotizing pancreatitis.     Past Medical History:  Diagnosis Date   Anemia    Hypertension     Patient Active Problem List   Diagnosis Date Noted   Acute pancreatitis 03/29/2021   Normocytic anemia 03/29/2021   GERD with esophagitis 03/29/2021   H. pylori infection 03/29/2021   Chest pain 03/29/2021   Severe acute pancreatitis 03/29/2021   Hypertension    Atypical chest pain    Iron deficiency anemia    Gastric erythema    Hiatal hernia    Acute esophagitis    Acute anemia 03/13/2021   Severe anemia 03/12/2021   Hypokalemia 03/12/2021   Hypomagnesemia 03/12/2021   Complex ovarian cyst 11/22/2018   Nausea and vomiting in adult 11/22/2018    Past Surgical History:  Procedure Laterality Date   COLONOSCOPY N/A 03/15/2021   Procedure: COLONOSCOPY;  Surgeon: Pasty Spillers, MD;  Location: [facility] ENDOSCOPY;  Service: Endoscopy;  Laterality: N/A;    ESOPHAGOGASTRODUODENOSCOPY N/A 03/15/2021   Procedure: ESOPHAGOGASTRODUODENOSCOPY (EGD);  Surgeon: Pasty Spillers, MD;  Location: [facility] ENDOSCOPY;  Service: Endoscopy;  Laterality: N/A;   MANDIBLE FRACTURE SURGERY      Prior to Admission medications   Medication Sig Start Date End Date Taking? Authorizing Provider  ondansetron (ZOFRAN ODT) 4 MG disintegrating tablet Take 1 tablet (4 mg total) by mouth every 8 (eight) hours as needed for nausea or vomiting. 06/24/21  Yes Irean Hong, MD  oxyCODONE-acetaminophen (PERCOCET/ROXICET) 5-325 MG tablet Take 1 tablet by mouth every 4 (four) hours as needed for severe pain. 06/24/21  Yes Irean Hong, MD  acetaminophen (TYLENOL) 325 MG tablet Take 2 tablets (650 mg total) by mouth every 8 (eight) hours as needed for mild pain, moderate pain or fever. 04/01/21   Hongalgi, Maximino Greenland, MD  ferrous sulfate 325 (65 FE) MG tablet Take 1 tablet (325 mg total) by mouth daily. 03/15/21 04/14/21  Marrion Coy, MD  folic acid (FOLVITE) 1 MG tablet Take 1 tablet (1 mg total) by mouth daily. 05/14/21 08/12/21  Claiborne Rigg, NP  Multiple Vitamins-Minerals (WOMENS 50+ MULTI VITAMIN/MIN PO) Take 1 tablet by mouth daily.    [provider]  thiamine 100 MG tablet Take 1 tablet (100 mg total) by mouth daily. 04/02/21   Hongalgi, Maximino Greenland, MD  vitamin B-12 (CYANOCOBALAMIN) 1000 MCG tablet Take 1 tablet (1,000 mcg total) by mouth daily. 03/15/21   Marrion Coy, MD    Allergies Patient  has no known allergies.  Family History  Problem Relation Age of Onset   Hepatitis C Mother     Social History Social History   Tobacco Use   Smoking status: Every Day    Packs/day: 1.00    Types: Cigarettes   Smokeless tobacco: Never  Vaping Use   Vaping Use: Some days  Substance Use Topics   Alcohol use: No    Comment: rarely    Drug use: No    Review of Systems  Constitutional: No fever/chills Eyes: No visual changes. ENT: No sore throat. Cardiovascular: Denies  chest pain. Respiratory: Denies shortness of breath. Gastrointestinal: Positive for abdominal pain and nausea, no vomiting.  No diarrhea.  No constipation. Genitourinary: Negative for dysuria. Musculoskeletal: Negative for back pain. Skin: Negative for rash. Neurological: Negative for headaches, focal weakness or numbness.   ____________________________________________   PHYSICAL EXAM:  VITAL SIGNS: ED Triage Vitals  Enc Vitals Group     BP 06/23/21 2159 124/82     Pulse Rate 06/23/21 2159 80     Resp 06/23/21 2159 18     Temp 06/23/21 2159 97.8 F (36.6 C)     Temp Source 06/23/21 2159 Oral     SpO2 06/23/21 2159 100 %     Weight 06/23/21 2200 139 lb (63 kg)     Height 06/23/21 2200 5\' 8"  (1.727 m)     Head Circumference --      Peak Flow --      Pain Score 06/23/21 2200 6     Pain Loc --      Pain Edu? --      Excl. in GC? --     Constitutional: Alert and oriented.  Chronically ill appearing and in mild acute distress. Eyes: Conjunctivae are normal. PERRL. EOMI. Head: Atraumatic. Nose: No congestion/rhinnorhea. Mouth/Throat: Mucous membranes are mildly dry.   Neck: No stridor.   Cardiovascular: Normal rate, regular rhythm. Grossly normal heart sounds.  Good peripheral circulation. Respiratory: Normal respiratory effort.  No retractions. Lungs CTAB. Gastrointestinal: Soft and mildly tender to palpation epigastrium without rebound or guarding. No distention. No abdominal bruits. No CVA tenderness. Musculoskeletal: No lower extremity tenderness nor edema.  No joint effusions. Neurologic:  Normal speech and language. No gross focal neurologic deficits are appreciated. No gait instability. Skin:  Skin is warm, dry and intact. No rash noted. Psychiatric: Mood and affect are normal. Speech and behavior are normal.  ____________________________________________   LABS (all labs ordered are listed, but only abnormal results are displayed)  Labs Reviewed  LIPASE, BLOOD -  Abnormal; Notable for the following components:      Result Value   Lipase 105 (*)    All other components within normal limits  COMPREHENSIVE METABOLIC PANEL - Abnormal; Notable for the following components:   Glucose, Bld 106 (*)    All other components within normal limits  CBC  URINALYSIS, COMPLETE (UACMP) WITH MICROSCOPIC  POC URINE PREG, ED   ____________________________________________  EKG  ED ECG REPORT I, Betsi Crespi J, the attending physician, personally viewed and interpreted this ECG.   Date: 06/24/2021  EKG Time: 2204  Rate: 76  Rhythm: normal sinus rhythm  Axis: Normal  Intervals:none  ST&T Change: Nonspecific  ____________________________________________  RADIOLOGY I, Madine Sarr J, personally viewed and evaluated these images (plain radiographs) as part of my medical decision making, as well as reviewing the written report by the radiologist.  ED MD interpretation: Cholelithiasis without cholecystitis  Official radiology report(s): 2205 ABDOMEN LIMITED  RUQ (LIVER/GB)  Result Date: 06/24/2021 CLINICAL DATA:  35 year old female with epigastric pain for 2 days. EXAM: ULTRASOUND ABDOMEN LIMITED RIGHT UPPER QUADRANT COMPARISON:  CT Abdomen and Pelvis 04/30/2021. FINDINGS: Gallbladder: Gallstones and tumefactive sludge within a partially contracted gallbladder (image 12). Gallbladder wall thickness remains normal to 2 mm. Estimated stone size 14 mm (image 38). No pericholecystic fluid. No sonographic Murphy sign elicited. Common bile duct: Diameter: 4 mm, normal. Liver: Background liver echogenicity is within normal limits. Mildly coarse hepatic echotexture but no discrete liver lesion. No intrahepatic biliary ductal dilatation. Portal vein is patent on color Doppler imaging with normal direction of blood flow towards the liver. Other: Negative visible right kidney. IMPRESSION: Positive for gallstones and sludge. No evidence of acute cholecystitis or bile duct obstruction.  Electronically Signed   By: Odessa Fleming M.D.   On: 06/24/2021 04:57    ____________________________________________   PROCEDURES  Procedure(s) performed (including Critical Care):  Procedures   ____________________________________________   INITIAL IMPRESSION / ASSESSMENT AND PLAN / ED COURSE  As part of my medical decision making, I reviewed the following data within the electronic MEDICAL RECORD NUMBER Nursing notes reviewed and incorporated, Labs reviewed, EKG interpreted, Old chart reviewed, Radiograph reviewed, and Notes from prior ED visits     35 year old female presenting with epigastric pain. Differential diagnosis includes, but is not limited to, biliary disease (biliary colic, acute cholecystitis, cholangitis, choledocholithiasis, etc), intrathoracic causes for epigastric abdominal pain including ACS, gastritis, duodenitis, pancreatitis, small bowel or large bowel obstruction, abdominal aortic aneurysm, hernia, and ulcer(s).   Laboratory results demonstrate elevated lipase.  Will administer IV analgesia and antiemetic.  Discussed with patient do not feel CT imaging is warranted at this time as her symptoms clinically do not suggest increased peripancreatic fluid or necrosis.  Will obtain right upper quadrant abdominal ultrasound to evaluate cholecystitis.  Will reassess.  Clinical Course as of 06/24/21 0646  Mon Jun 24, 2021  0600 Patient resting in no acute distress, family member by bedside.  Updated patient on ultrasound result.  Will discharge home with prescriptions for Percocet and Zofran to use as needed, and patient will follow up with general surgery.  Strict return precautions given.  Patient and family member verbalized understanding and agree with plan of care. [JS]    Clinical Course User Index [JS] Irean Hong, MD     ____________________________________________   FINAL CLINICAL IMPRESSION(S) / ED DIAGNOSES  Final diagnoses:  Epigastric pain  Acute  pancreatitis without infection or necrosis, unspecified pancreatitis type  Calculus of gallbladder without cholecystitis without obstruction     ED Discharge Orders          Ordered    oxyCODONE-acetaminophen (PERCOCET/ROXICET) 5-325 MG tablet  Every 4 hours PRN        06/24/21 0602    ondansetron (ZOFRAN ODT) 4 MG disintegrating tablet  Every 8 hours PRN        06/24/21 0602             Note:  This document was prepared using Dragon voice recognition software and may include unintentional dictation errors.    Irean Hong, MD 06/24/21 [phone]

## 2021-06-24 NOTE — Discharge Instructions (Addendum)
1.  You may take pain and nausea medicines as needed (Percocet/Zofran #20). 2.  Eat a bland diet until seen by the surgeon. 3.  Return to the ER for worsening symptoms, persistent vomiting, difficulty breathing or other concerns.

## 2021-06-25 ENCOUNTER — Other Ambulatory Visit: Payer: Self-pay

## 2021-06-25 ENCOUNTER — Encounter: Payer: Self-pay | Admitting: Nurse Practitioner

## 2021-06-25 ENCOUNTER — Encounter: Payer: Self-pay | Admitting: Oncology

## 2021-06-25 ENCOUNTER — Other Ambulatory Visit (HOSPITAL_COMMUNITY)
Admission: RE | Admit: 2021-06-25 | Discharge: 2021-06-25 | Disposition: A | Discharge status: Home / Self Care | Payer: Self-pay | Source: Ambulatory Visit | Attending: Nurse Practitioner | Admitting: Nurse Practitioner

## 2021-06-25 ENCOUNTER — Ambulatory Visit: Payer: Self-pay | Attending: Nurse Practitioner | Admitting: Nurse Practitioner

## 2021-06-25 VITALS — BP 107/76 | HR 93 | Ht 68.0 in | Wt 135.2 lb

## 2021-06-25 DIAGNOSIS — Z1159 Encounter for screening for other viral diseases: Secondary | ICD-10-CM

## 2021-06-25 DIAGNOSIS — Z124 Encounter for screening for malignant neoplasm of cervix: Secondary | ICD-10-CM | POA: Insufficient documentation

## 2021-06-25 DIAGNOSIS — Z114 Encounter for screening for human immunodeficiency virus [HIV]: Secondary | ICD-10-CM

## 2021-06-25 DIAGNOSIS — G63 Polyneuropathy in diseases classified elsewhere: Secondary | ICD-10-CM

## 2021-06-25 MED ORDER — GABAPENTIN 300 MG PO CAPS
300.0000 mg | ORAL_CAPSULE | Freq: Three times a day (TID) | ORAL | 3 refills | Status: DC
  Filled 2021-06-25: qty 90, 30d supply, fill #0

## 2021-06-25 MED ORDER — GABAPENTIN 300 MG PO CAPS: 300.0000 mg | ORAL_CAPSULE | Freq: Three times a day (TID) | ORAL | 3 refills | Status: DC

## 2021-06-25 NOTE — Progress Notes (Signed)
Assessment & Plan:  Sandra Pearson was seen today for gynecologic exam.  Diagnoses and all orders for this visit:  Encounter for Papanicolaou smear for cervical cancer screening -     Cytology - PAP -     Cervicovaginal ancillary only  Polyneuropathy associated with underlying disease (HCC) -     gabapentin (NEURONTIN) 300 MG capsule; Take 1 capsule (300 mg total) by mouth 3 (three) times daily. -     Vitamin B12  Encounter for screening for HIV -     HIV antibody (with reflex)  Need for hepatitis C screening test -     HCV Ab w Reflex to Quant PCR   Patient has been counseled on age-appropriate routine health concerns for screening and prevention. These are reviewed and up-to-date. Referrals have been placed accordingly. Immunizations are up-to-date or declined.    Subjective:   Chief Complaint  Patient presents with   Gynecologic Exam   Gynecologic Exam Pertinent negatives include no abdominal pain, chills, fever, flank pain or rash.  [name] 35 y.o. female presents to office today for PAP smear.     Neuropathy: She describes symptoms of numbness, burning, lancinating pain, tingling, and hypersensitivity. Onset of symptoms was  a few years ago . Symptoms are currently of moderate and severe severity. Symptoms occur intermittently and last hours. The patient denies cramping and squeezing. Symptoms are worse in the lower extremities and worse in the upper extremities.  Previous treatment has included gabapentin, which she can not recall if  improved symptoms. She does have a previous history of heavy alcohol use in the past but has not consumed any alcohol in several months.   Review of Systems  Constitutional: Negative.  Negative for chills, fever, malaise/fatigue and weight loss.  Respiratory: Negative.  Negative for cough, shortness of breath and wheezing.   Cardiovascular: Negative.  Negative for chest pain, orthopnea and leg swelling.  Gastrointestinal:   Negative for abdominal pain.  Genitourinary: Negative.  Negative for flank pain.  Skin: Negative.  Negative for rash.  Neurological:  Positive for sensory change.  Psychiatric/Behavioral:  Negative for suicidal ideas.    Past Medical History:  Diagnosis Date   Anemia    Hypertension     Past Surgical History:  Procedure Laterality Date   COLONOSCOPY N/A 03/15/2021   Procedure: COLONOSCOPY;  Surgeon: Pasty Spillers, MD;  Location: [facility] ENDOSCOPY;  Service: Endoscopy;  Laterality: N/A;   ESOPHAGOGASTRODUODENOSCOPY N/A 03/15/2021   Procedure: ESOPHAGOGASTRODUODENOSCOPY (EGD);  Surgeon: Pasty Spillers, MD;  Location: [facility] ENDOSCOPY;  Service: Endoscopy;  Laterality: N/A;   MANDIBLE FRACTURE SURGERY      Family History  Problem Relation Age of Onset   Hepatitis C Mother     Social History Reviewed with no changes to be made today.   Outpatient Medications Prior to Visit  Medication Sig Dispense Refill   acetaminophen (TYLENOL) 325 MG tablet Take 2 tablets (650 mg total) by mouth every 8 (eight) hours as needed for mild pain, moderate pain or fever.     folic acid (FOLVITE) 1 MG tablet Take 1 tablet (1 mg total) by mouth daily. 90 tablet 3   Multiple Vitamins-Minerals (WOMENS 50+ MULTI VITAMIN/MIN PO) Take 1 tablet by mouth daily.     ondansetron (ZOFRAN ODT) 4 MG disintegrating tablet Take 1 tablet (4 mg total) by mouth every 8 (eight) hours as needed for nausea or vomiting. 20 tablet 0   oxyCODONE-acetaminophen (PERCOCET/ROXICET) 5-325 MG tablet Take 1 tablet  by mouth every 4 (four) hours as needed for severe pain. 20 tablet 0   thiamine 100 MG tablet Take 1 tablet (100 mg total) by mouth daily. 30 tablet 0   vitamin B-12 (CYANOCOBALAMIN) 1000 MCG tablet Take 1 tablet (1,000 mcg total) by mouth daily. 30 tablet 0   ferrous sulfate 325 (65 FE) MG tablet Take 1 tablet (325 mg total) by mouth daily. 30 tablet 0   No facility-administered medications prior to visit.    No  Known Allergies     Objective:    BP 107/76   Pulse 93   Ht 5\' 8"  (1.727 m)   Wt 135 lb 4 oz (61.3 kg)   LMP 06/21/2021 (Exact Date)   SpO2 99%   BMI 20.56 kg/m  Wt Readings from Last 3 Encounters:  06/25/21 135 lb 4 oz (61.3 kg)  06/23/21 139 lb (63 kg)  05/28/21 136 lb 12.8 oz (62.1 kg)    Physical Exam Exam conducted with a chaperone present.  Constitutional:      Appearance: She is well-developed.  HENT:     Head: Normocephalic.  Cardiovascular:     Rate and Rhythm: Normal rate and regular rhythm.     Heart sounds: Normal heart sounds.  Pulmonary:     Effort: Pulmonary effort is normal.     Breath sounds: Normal breath sounds.  Abdominal:     General: Bowel sounds are normal.     Palpations: Abdomen is soft.     Hernia: There is no hernia in the left inguinal area.  Genitourinary:    Labia:        Right: No rash, tenderness, lesion or injury.        Left: No rash, tenderness, lesion or injury.      Vagina: Normal. No signs of injury and foreign body. No vaginal discharge, erythema, tenderness or bleeding.     Cervix: No cervical motion tenderness or friability.     Uterus: Not deviated and not enlarged.      Adnexa:        Right: No mass, tenderness or fullness.         Left: No mass, tenderness or fullness.       Rectum: Normal. No external hemorrhoid.  Lymphadenopathy:     Lower Body: No right inguinal adenopathy. No left inguinal adenopathy.  Skin:    General: Skin is warm and dry.  Neurological:     Mental Status: She is alert and oriented to person, place, and time.  Psychiatric:        Behavior: Behavior normal.        Thought Content: Thought content normal.        Judgment: Judgment normal.         Patient has been counseled extensively about nutrition and exercise as well as the importance of adherence with medications and regular follow-up. The patient was given clear instructions to go to ER or return to medical center if symptoms don't  improve, worsen or new problems develop. The patient verbalized understanding.   Follow-up: Return in 5 weeks (on 07/30/2021) for TELE on virtual day 07-30-2021.   09-29-2021, FNP-BC [facility] and [facility] [place], [place]   06/25/2021, 3:28 PM

## 2021-06-25 NOTE — Addendum Note (Signed)
Addended by: Bertram Denver on: 06/25/2021 03:50 PM   Modules accepted: Orders

## 2021-06-26 LAB — HIV ANTIBODY (ROUTINE TESTING W REFLEX): HIV Screen 4th Generation wRfx: NONREACTIVE

## 2021-06-26 LAB — HCV INTERPRETATION

## 2021-06-26 LAB — HCV AB W REFLEX TO QUANT PCR: HCV Ab: <0.1 s/co ratio (ref 0.0–0.9)

## 2021-06-26 LAB — CERVICOVAGINAL ANCILLARY ONLY
Bacterial Vaginitis (gardnerella): NEGATIVE
Candida Glabrata: NEGATIVE
Candida Vaginitis: NEGATIVE
Chlamydia: NEGATIVE
Comment: NEGATIVE
Comment: NEGATIVE
Comment: NEGATIVE
Comment: NEGATIVE
Comment: NEGATIVE
Comment: NORMAL
Neisseria Gonorrhea: NEGATIVE
Trichomonas: NEGATIVE

## 2021-06-26 LAB — VITAMIN B12: Vitamin B-12: 1946 pg/mL — ABNORMAL HIGH (ref 232–1245)

## 2021-06-27 LAB — CYTOLOGY - PAP
Comment: NEGATIVE
Diagnosis: NEGATIVE
High risk HPV: NEGATIVE

## 2021-07-05 ENCOUNTER — Other Ambulatory Visit: Payer: Self-pay

## 2021-07-05 ENCOUNTER — Ambulatory Visit (INDEPENDENT_AMBULATORY_CARE_PROVIDER_SITE_OTHER): Payer: Self-pay | Admitting: Surgery

## 2021-07-05 ENCOUNTER — Encounter: Payer: Self-pay | Admitting: Surgery

## 2021-07-05 VITALS — BP 117/83 | HR 94 | Temp 98.1°F | Ht 68.0 in | Wt 136.0 lb

## 2021-07-05 DIAGNOSIS — K8591 Acute pancreatitis with uninfected necrosis, unspecified: Secondary | ICD-10-CM

## 2021-07-05 DIAGNOSIS — K851 Biliary acute pancreatitis without necrosis or infection: Secondary | ICD-10-CM

## 2021-07-05 NOTE — Progress Notes (Signed)
07/05/2021  Reason for Visit: Gallstone pancreatitis  History of Present Illness: Sandra Pearson is a 35 y.o. female presenting for evaluation of cholelithiasis in the setting of likely gallstone pancreatitis.  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  She required transfer to [facility] as a precaution due to the severity of the pancreatitis and the possibility for needing surgery.  There is concern that this could have been alcohol induced but per notes, her prior alcohol intake was 2 to 3 days prior to the onset of pain.  She had a repeat CT scan in July 2022 which showed a much improved smaller pseudocyst with no further inflammation.  She reports that from the abdominal standpoint she has been doing better until more recently last week she presented to the emergency room again on 06/24/2021 for upper abdominal pain that was radiating towards her back associated with nausea.  She had an ultrasound that showed cholelithiasis and overall her labs were unremarkable except for a lipase of 105.  She reports that since that visit in the emergency room, she has had 1 more episode 3 days after that visit which was much milder and did not require any visits to the emergency room.  She reports that now thinking back to prior symptoms, she feels that she has had issues with upper abdominal pain in the past that she was attributing to heartburn or indigestion but she is wondering if this could be more related to her gallbladder.  Denies any fevers but reports having some sweats when she went to the emergency room.  Since then denies any further issues except for the milder episode 3 days later.  Past Medical History: Past Medical History:  Diagnosis Date   Anemia    Hypertension      Past Surgical History: Past Surgical History:  Procedure Laterality Date   COLONOSCOPY N/A 03/15/2021   Procedure: COLONOSCOPY;   Surgeon: Pasty Spillers, MD;  Location: [facility] ENDOSCOPY;  Service: Endoscopy;  Laterality: N/A;   ESOPHAGOGASTRODUODENOSCOPY N/A 03/15/2021   Procedure: ESOPHAGOGASTRODUODENOSCOPY (EGD);  Surgeon: Pasty Spillers, MD;  Location: [facility] ENDOSCOPY;  Service: Endoscopy;  Laterality: N/A;   MANDIBLE FRACTURE SURGERY      Home Medications: Prior to Admission medications   Medication Sig Start Date End Date Taking? Authorizing Provider  acetaminophen (TYLENOL) 325 MG tablet Take 2 tablets (650 mg total) by mouth every 8 (eight) hours as needed for mild pain, moderate pain or fever. 04/01/21  Yes Hongalgi, Maximino Greenland, MD  ferrous sulfate 325 (65 FE) MG tablet Take 1 tablet (325 mg total) by mouth daily. 03/15/21 07/05/21 Yes Marrion Coy, MD  folic acid (FOLVITE) 1 MG tablet Take 1 tablet (1 mg total) by mouth daily. 05/14/21 08/12/21 Yes Claiborne Rigg, NP  gabapentin (NEURONTIN) 300 MG capsule Take 1 capsule (300 mg total) by mouth 3 (three) times daily. 06/25/21  Yes Claiborne Rigg, NP  Multiple Vitamins-Minerals (WOMENS 50+ MULTI VITAMIN/MIN PO) Take 1 tablet by mouth daily.   Yes [provider]  ondansetron (ZOFRAN ODT) 4 MG disintegrating tablet Take 1 tablet (4 mg total) by mouth every 8 (eight) hours as needed for nausea or vomiting. 06/24/21  Yes Irean Hong, MD  thiamine 100 MG tablet Take 1 tablet (100 mg total) by mouth daily. 04/02/21  Yes Hongalgi, Maximino Greenland, MD  vitamin B-12 (CYANOCOBALAMIN) 1000 MCG tablet Take 1 tablet (1,000 mcg total) by  mouth daily. 03/15/21  Yes Marrion Coy, MD    Allergies: No Known Allergies  Social History:  reports that she has been smoking cigarettes. She has been smoking an average of 1 pack per day. She has never used smokeless tobacco. She reports that she does not drink alcohol and does not use drugs.   Family History: Family History  Problem Relation Age of Onset   Hepatitis C Mother     Review of Systems: Review of Systems   Constitutional:  Negative for chills and fever.  HENT:  Negative for hearing loss.   Respiratory:  Negative for shortness of breath.   Cardiovascular:  Negative for chest pain.  Gastrointestinal:  Positive for abdominal pain and nausea. Negative for diarrhea and vomiting.  Genitourinary:  Negative for dysuria.  Musculoskeletal:  Negative for myalgias.  Skin:  Negative for rash.  Neurological:  Negative for dizziness.  Psychiatric/Behavioral:  Negative for depression.    Physical Exam BP 117/83   Pulse 94   Temp 98.1 F (36.7 C) (Oral)   Ht 5\' 8"  (1.727 m)   Wt 136 lb (61.7 kg)   LMP 06/21/2021 (Exact Date)   SpO2 96%   BMI 20.68 kg/m  CONSTITUTIONAL: No acute distress HEENT:  Normocephalic, atraumatic, extraocular motion intact. NECK: Trachea is midline, and there is no jugular venous distension.  RESPIRATORY:  Lungs are clear, and breath sounds are equal bilaterally. Normal respiratory effort without pathologic use of accessory muscles. CARDIOVASCULAR: Heart is regular without murmurs, gallops, or rubs. GI: The abdomen is soft, nondistended, currently nontender to palpation.  MUSCULOSKELETAL:  Normal muscle strength and tone in all four extremities.  No peripheral edema or cyanosis. SKIN: Skin turgor is normal. There are no pathologic skin lesions.  NEUROLOGIC:  Motor and sensation is grossly normal.  Cranial nerves are grossly intact. PSYCH:  Alert and oriented to person, place and time. Affect is normal.  Laboratory Analysis: Labs from 06/15/2021: Sodium 135, potassium 4.2, chloride 99, CO2 28, BUN 10, creatinine 0.68.  Total bilirubin 0.8, AST 32, ALT 22, alkaline phosphatase 82, lipase 105.  WBC 5.2, hemoglobin 14.3, hematocrit 42.1, platelets 203.  Imaging: Ultrasound RUQ on 06/24/2021: FINDINGS: Gallbladder: Gallstones and tumefactive sludge within a partially contracted gallbladder (image 12). Gallbladder wall thickness remains normal to 2 mm. Estimated stone size  14 mm (image 38). No pericholecystic fluid. No sonographic Murphy sign elicited.   Common bile duct: Diameter: 4 mm, normal.   Liver: Background liver echogenicity is within normal limits. Mildly coarse hepatic echotexture but no discrete liver lesion. No intrahepatic biliary ductal dilatation. Portal vein is patent on color Doppler imaging with normal direction of blood flow towards the liver.   Other: Negative visible right kidney.   IMPRESSION: Positive for gallstones and sludge. No evidence of acute cholecystitis or bile duct obstruction.  CT scan pancreas on 04/30/2021: IMPRESSION: 1. Significant interval decrease in size of a fluid collection within the central pancreatic head, now measuring 1.6 x 1.6 cm, previously 5.2 x 5.1 cm when measured similarly. There has been interval resolution of previously seen inflammatory fat stranding about the pancreas. Findings are consistent with resolution of acute pancreatitis and a small, resolving pseudocyst sequela of walled-off necrosis. 2. 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 3. Unchanged splenomegaly.  The splenic and portal veins are patent.   Assessment and Plan: This is a 35 y.o. female with a history of necrotizing pancreatitis and gallstone pancreatitis.  -  Discussed with the patient the results of her imaging studies back in July as well as in her emergency room visit last month.  Overall although her lipase was mildly elevated to 105, she has not been having any worsening abdominal pain and more recently has been more focused on upper abdominal discomfort with postprandial discomfort.  Discussed with her that although it is 100% clear, likely possibility for her pancreatitis is related to her gallbladder and gallstones.  Discussed with her that unfortunately there is no good medical management to get rid of the stones and thus I gave her  recommendation for cholecystectomy.  She is willing to proceed.  Discussed with her robotic cholecystectomy approach and the surgery in length including the risks of bleeding, infection, injury to surrounding structures, postoperative recovery and activity restrictions and she is willing to proceed. - We will schedule patient for robotic cholecystectomy with ICG cholangiogram on 07/16/2021.  Face-to-face time spent with the patient and care providers was 60 minutes, with more than 50% of the time spent counseling, educating, and coordinating care of the patient.     Howie Ill, MD [facility]

## 2021-07-05 NOTE — H&P (View-Only) (Signed)
07/05/2021  Reason for Visit: Gallstone pancreatitis  History of Present Illness: Sandra Pearson is a 35 y.o. female presenting for evaluation of cholelithiasis in the setting of likely gallstone pancreatitis.  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  She required transfer to [facility] as a precaution due to the severity of the pancreatitis and the possibility for needing surgery.  There is concern that this could have been alcohol induced but per notes, her prior alcohol intake was 2 to 3 days prior to the onset of pain.  She had a repeat CT scan in July 2022 which showed a much improved smaller pseudocyst with no further inflammation.  She reports that from the abdominal standpoint she has been doing better until more recently last week she presented to the emergency room again on 06/24/2021 for upper abdominal pain that was radiating towards her back associated with nausea.  She had an ultrasound that showed cholelithiasis and overall her labs were unremarkable except for a lipase of 105.  She reports that since that visit in the emergency room, she has had 1 more episode 3 days after that visit which was much milder and did not require any visits to the emergency room.  She reports that now thinking back to prior symptoms, she feels that she has had issues with upper abdominal pain in the past that she was attributing to heartburn or indigestion but she is wondering if this could be more related to her gallbladder.  Denies any fevers but reports having some sweats when she went to the emergency room.  Since then denies any further issues except for the milder episode 3 days later.  Past Medical History: Past Medical History:  Diagnosis Date   Anemia    Hypertension      Past Surgical History: Past Surgical History:  Procedure Laterality Date   COLONOSCOPY N/A 03/15/2021   Procedure: COLONOSCOPY;   Surgeon: Pasty Spillers, MD;  Location: [facility] ENDOSCOPY;  Service: Endoscopy;  Laterality: N/A;   ESOPHAGOGASTRODUODENOSCOPY N/A 03/15/2021   Procedure: ESOPHAGOGASTRODUODENOSCOPY (EGD);  Surgeon: Pasty Spillers, MD;  Location: [facility] ENDOSCOPY;  Service: Endoscopy;  Laterality: N/A;   MANDIBLE FRACTURE SURGERY      Home Medications: Prior to Admission medications   Medication Sig Start Date End Date Taking? Authorizing Provider  acetaminophen (TYLENOL) 325 MG tablet Take 2 tablets (650 mg total) by mouth every 8 (eight) hours as needed for mild pain, moderate pain or fever. 04/01/21  Yes Hongalgi, Maximino Greenland, MD  ferrous sulfate 325 (65 FE) MG tablet Take 1 tablet (325 mg total) by mouth daily. 03/15/21 07/05/21 Yes Marrion Coy, MD  folic acid (FOLVITE) 1 MG tablet Take 1 tablet (1 mg total) by mouth daily. 05/14/21 08/12/21 Yes Claiborne Rigg, NP  gabapentin (NEURONTIN) 300 MG capsule Take 1 capsule (300 mg total) by mouth 3 (three) times daily. 06/25/21  Yes Claiborne Rigg, NP  Multiple Vitamins-Minerals (WOMENS 50+ MULTI VITAMIN/MIN PO) Take 1 tablet by mouth daily.   Yes [provider]  ondansetron (ZOFRAN ODT) 4 MG disintegrating tablet Take 1 tablet (4 mg total) by mouth every 8 (eight) hours as needed for nausea or vomiting. 06/24/21  Yes Irean Hong, MD  thiamine 100 MG tablet Take 1 tablet (100 mg total) by mouth daily. 04/02/21  Yes Hongalgi, Maximino Greenland, MD  vitamin B-12 (CYANOCOBALAMIN) 1000 MCG tablet Take 1 tablet (1,000 mcg total) by  mouth daily. 03/15/21  Yes Marrion Coy, MD    Allergies: No Known Allergies  Social History:  reports that she has been smoking cigarettes. She has been smoking an average of 1 pack per day. She has never used smokeless tobacco. She reports that she does not drink alcohol and does not use drugs.   Family History: Family History  Problem Relation Age of Onset   Hepatitis C Mother     Review of Systems: Review of Systems   Constitutional:  Negative for chills and fever.  HENT:  Negative for hearing loss.   Respiratory:  Negative for shortness of breath.   Cardiovascular:  Negative for chest pain.  Gastrointestinal:  Positive for abdominal pain and nausea. Negative for diarrhea and vomiting.  Genitourinary:  Negative for dysuria.  Musculoskeletal:  Negative for myalgias.  Skin:  Negative for rash.  Neurological:  Negative for dizziness.  Psychiatric/Behavioral:  Negative for depression.    Physical Exam BP 117/83   Pulse 94   Temp 98.1 F (36.7 C) (Oral)   Ht 5\' 8"  (1.727 m)   Wt 136 lb (61.7 kg)   LMP 06/21/2021 (Exact Date)   SpO2 96%   BMI 20.68 kg/m  CONSTITUTIONAL: No acute distress HEENT:  Normocephalic, atraumatic, extraocular motion intact. NECK: Trachea is midline, and there is no jugular venous distension.  RESPIRATORY:  Lungs are clear, and breath sounds are equal bilaterally. Normal respiratory effort without pathologic use of accessory muscles. CARDIOVASCULAR: Heart is regular without murmurs, gallops, or rubs. GI: The abdomen is soft, nondistended, currently nontender to palpation.  MUSCULOSKELETAL:  Normal muscle strength and tone in all four extremities.  No peripheral edema or cyanosis. SKIN: Skin turgor is normal. There are no pathologic skin lesions.  NEUROLOGIC:  Motor and sensation is grossly normal.  Cranial nerves are grossly intact. PSYCH:  Alert and oriented to person, place and time. Affect is normal.  Laboratory Analysis: Labs from 06/15/2021: Sodium 135, potassium 4.2, chloride 99, CO2 28, BUN 10, creatinine 0.68.  Total bilirubin 0.8, AST 32, ALT 22, alkaline phosphatase 82, lipase 105.  WBC 5.2, hemoglobin 14.3, hematocrit 42.1, platelets 203.  Imaging: Ultrasound RUQ on 06/24/2021: FINDINGS: Gallbladder: Gallstones and tumefactive sludge within a partially contracted gallbladder (image 12). Gallbladder wall thickness remains normal to 2 mm. Estimated stone size  14 mm (image 38). No pericholecystic fluid. No sonographic Murphy sign elicited.   Common bile duct: Diameter: 4 mm, normal.   Liver: Background liver echogenicity is within normal limits. Mildly coarse hepatic echotexture but no discrete liver lesion. No intrahepatic biliary ductal dilatation. Portal vein is patent on color Doppler imaging with normal direction of blood flow towards the liver.   Other: Negative visible right kidney.   IMPRESSION: Positive for gallstones and sludge. No evidence of acute cholecystitis or bile duct obstruction.  CT scan pancreas on 04/30/2021: IMPRESSION: 1. Significant interval decrease in size of a fluid collection within the central pancreatic head, now measuring 1.6 x 1.6 cm, previously 5.2 x 5.1 cm when measured similarly. There has been interval resolution of previously seen inflammatory fat stranding about the pancreas. Findings are consistent with resolution of acute pancreatitis and a small, resolving pseudocyst sequela of walled-off necrosis. 2. 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 3. Unchanged splenomegaly.  The splenic and portal veins are patent.   Assessment and Plan: This is a 35 y.o. female with a history of necrotizing pancreatitis and gallstone pancreatitis.  -  Discussed with the patient the results of her imaging studies back in July as well as in her emergency room visit last month.  Overall although her lipase was mildly elevated to 105, she has not been having any worsening abdominal pain and more recently has been more focused on upper abdominal discomfort with postprandial discomfort.  Discussed with her that although it is 100% clear, likely possibility for her pancreatitis is related to her gallbladder and gallstones.  Discussed with her that unfortunately there is no good medical management to get rid of the stones and thus I gave her  recommendation for cholecystectomy.  She is willing to proceed.  Discussed with her robotic cholecystectomy approach and the surgery in length including the risks of bleeding, infection, injury to surrounding structures, postoperative recovery and activity restrictions and she is willing to proceed. - We will schedule patient for robotic cholecystectomy with ICG cholangiogram on 07/16/2021.  Face-to-face time spent with the patient and care providers was 60 minutes, with more than 50% of the time spent counseling, educating, and coordinating care of the patient.     Howie Ill, MD [facility]

## 2021-07-05 NOTE — Patient Instructions (Signed)
Our surgery scheduler Britta Mccreedy will call you within 24-48 hours to get you scheduled. If you have not heard from her after 48 hours, please call our office. You will not need to get Covid tested before surgery and have the blue sheet available when she calls to write down important information.  If you have any concerns or questions, please feel free to call our office.   Minimally Invasive Cholecystectomy  Minimally invasive cholecystectomy is surgery to remove the gallbladder. The gallbladder is a pear-shaped organ that lies beneath the liver on the right side of the body. The gallbladder stores bile, which is a fluid that helps the body digest fats. Cholecystectomy is often done to treat inflammation of the gallbladder (cholecystitis). This condition is usually caused by a buildup of gallstones (cholelithiasis) in the gallbladder. Gallstones can block the flow of bile, which can result in inflammation and pain. In severe cases, emergency surgery may be required. This procedure is done though small incisions in the abdomen, instead of one large incision. It is also called laparoscopic surgery. A thin scope with a camera (laparoscope) is inserted through one incision. Then surgical instruments are inserted through the other incisions. In some cases, a minimally invasive surgery may need to be changed to a surgery that is done through a larger incision. This is called open surgery. Tell a health care provider about: Any allergies you have. All medicines you are taking, including vitamins, herbs, eye drops, creams, and over-the-counter medicines. Any problems you or family members have had with anesthetic medicines. Any blood disorders you have. Any surgeries you have had. Any medical conditions you have. Whether you are pregnant or may be pregnant. What are the risks? Generally, this is a safe procedure. However, problems may occur, including: Infection. Bleeding. Allergic reactions to  medicines. Damage to nearby structures or organs. A stone remaining in the common bile duct. The common bile duct carries bile from the gallbladder into the small intestine. A bile leak from the cyst duct that is clipped when your gallbladder is removed. What happens before the procedure? Staying hydrated Follow instructions from your health care provider about hydration, which may include: Up to 2 hours before the procedure - you may continue to drink clear liquids, such as water, clear fruit juice, black coffee, and plain tea.  Eating and drinking restrictions Follow instructions from your health care provider about eating and drinking, which may include: 8 hours before the procedure - stop eating heavy meals or foods, such as meat, fried foods, or fatty foods. 6 hours before the procedure - stop eating light meals or foods, such as toast or cereal. 6 hours before the procedure - stop drinking milk or drinks that contain milk. 2 hours before the procedure - stop drinking clear liquids. Medicines Ask your health care provider about: Changing or stopping your regular medicines. This is especially important if you are taking diabetes medicines or blood thinners. Taking medicines such as aspirin and ibuprofen. These medicines can thin your blood. Do not take these medicines unless your health care provider tells you to take them. Taking over-the-counter medicines, vitamins, herbs, and supplements. General instructions Let your health care provider know if you develop a cold or an infection before surgery. Plan to have someone take you home from the hospital or clinic. If you will be going home right after the procedure, plan to have someone with you for 24 hours. Ask your health care provider: How your surgery site will be marked. What  steps will be taken to help prevent infection. These may include: Removing hair at the surgery site. Washing skin with a germ-killing soap. Taking  antibiotic medicine. What happens during the procedure?  An IV will be inserted into one of your veins. You will be given one or both of the following: A medicine to help you relax (sedative). A medicine to make you fall asleep (general anesthetic). A breathing tube will be placed in your mouth. Your surgeon will make several small incisions in your abdomen. The laparoscope will be inserted through one of the small incisions. The camera on the laparoscope will send images to a monitor in the operating room. This lets your surgeon see inside your abdomen. A gas will be pumped into your abdomen. This will expand your abdomen to give the surgeon more room to perform the surgery. Other tools that are needed for the procedure will be inserted through the other incisions. The gallbladder will be removed through one of the incisions. Your common bile duct may be examined. If stones are found in the common bile duct, they may be removed. After your gallbladder has been removed, the incisions will be closed with stitches (sutures), staples, or skin glue. Your incisions may be covered with a bandage (dressing). The procedure may vary among health care providers and hospitals. What happens after the procedure? Your blood pressure, heart rate, breathing rate, and blood oxygen level will be monitored until you leave the hospital or clinic. You will be given medicines as needed to control your pain. If you were given a sedative during the procedure, it can affect you for several hours. Do not drive or operate machinery until your health care provider says that it is safe. Summary Minimally invasive cholecystectomy, also called laparoscopic cholecystectomy, is surgery to remove the gallbladder using small incisions. Tell your health care provider about all the medical conditions you have and all the medicines you are taking for those conditions. Before the procedure, follow instructions about eating or  drinking restrictions and changing or stopping medicines. If you were given a sedative during the procedure, it can affect you for several hours. Do not drive or operate machinery until your health care provider says that it is safe. This information is not intended to replace advice given to you by your health care provider. Make sure you discuss any questions you have with your health care provider. Document Revised: 07/18/2019 Document Reviewed: 07/18/2019 Elsevier Patient Education  2022 [employer]   Pancreatitis Eating Plan  Pancreatitis is when your pancreas becomes irritated and swollen (inflamed). The pancreas is a small organ located behind your stomach. It helps your body digest food and regulate your blood sugar. Pancreatitis can affect how your body digests food, especially foods with fat. You may also have other symptoms such as abdominal pain or nausea. When you have pancreatitis, following a low-fat eating plan may help you manage symptoms and recover more quickly. Work with your health care provider or a diet and nutrition specialist (dietitian) to create an eating plan that is right for you. What are tips for following this plan? Reading food labels Use the information on food labels to help keep track of how much fat you eat: Check the serving size. Look for the amount of total fat in grams (g) in one serving. Low-fat foods have 3 g of fat or less per serving. Fat-free foods have 0.5 g of fat or less per serving. Keep track of how much fat you eat  based on how many servings you eat. For example, if you eat two servings, the amount of fat you eat will be two times what is listed on the label. Shopping  Buy low-fat or nonfat foods, such as: Fresh, frozen, or canned fruits and vegetables. Grains, including pasta, bread, and rice. Lean meat, poultry, fish, and other protein foods. Low-fat or nonfat dairy. Avoid buying bakery products and other sweets made with whole milk,  butter, and eggs. Avoid buying snack foods with added fat, such as anything with butter or cheese flavoring. Cooking Remove skin from poultry, and remove extra fat from meat. Limit the amount of fat and oil you use to 6 teaspoons or less per day. Cook using low-fat methods, such as boiling, broiling, grilling, steaming, or baking. Use spray oil to cook. Add fat-free chicken broth to add flavor and moisture. Avoid adding cream to thicken soups or sauces. Use other thickeners such as corn starch or tomato paste. Meal planning  Eat a low-fat diet as told by your dietitian. For most people, this means having no more than 55-65 grams of fat each day. Eat small, frequent meals throughout the day. For example, you may have 5-6 small meals instead of 3 large meals. Drink enough fluid to keep your urine pale yellow. Do not drink alcohol. Talk to your health care provider if you need help stopping. Limit how much caffeine you have, including black coffee, black and green tea, caffeinated soft drinks, and energy drinks. General information Let your health care provider or dietitian know if you have unplanned weight loss on this eating plan. You may be instructed to follow a clear liquid diet during a flare of symptoms. Talk with your health care provider about how to manage your diet during symptoms of a flare. Take any vitamins or supplements as told by your health care provider. Work with a Data processing manager, especially if you have other conditions such as obesity or diabetes mellitus. What foods should I avoid? Fruits Fried fruits. Fruits served with butter or cream. Vegetables Fried vegetables. Vegetables cooked with butter, cheese, or cream. Grains Biscuits, waffles, donuts, pastries, and croissants. Pies and cookies. Butter-flavored popcorn. Regular crackers. Meats and other protein foods Fatty cuts of meat. Poultry with skin. Organ meats. Bacon, sausage, and cold cuts. Whole eggs. Nuts and nut  butters. Dairy Whole and 2% milk. Whole milk yogurt. Whole milk ice cream. Cream and half-and-half. Cream cheese. Sour cream. Cheese. Beverages Wine, beer, and liquor. The items listed above may not be a complete list of foods and beverages to avoid. Contact a dietitian for more information. Summary Pancreatitis can affect how your body digests food, especially foods with fat. When you have pancreatitis, it is recommended that you follow a low-fat eating plan to help you recover more quickly and manage symptoms. For most people, this means limiting fat to no more than 55-65 grams per day. Do not drink alcohol. Limit the amount of caffeine you have, and drink enough fluid to keep your urine pale yellow. This information is not intended to replace advice given to you by your health care provider. Make sure you discuss any questions you have with your health care provider. Document Revised: 02/03/2019 Document Reviewed: 01/19/2018 Elsevier Patient Education  2022 [employer].

## 2021-07-08 ENCOUNTER — Encounter: Payer: Self-pay | Admitting: Oncology

## 2021-07-08 ENCOUNTER — Telehealth: Payer: Self-pay | Admitting: Surgery

## 2021-07-08 NOTE — Telephone Encounter (Signed)
Patient has been advised of Pre-Admission date/time, COVID Testing date and Surgery date.  Surgery Date: 07/16/21 Preadmission Testing Date: 07/11/21 (phone 1p-5p) Covid Testing Date: Not needed.    Patient has been made aware to call [phone], between 1-3:00pm the day before surgery, to find out what time to arrive for surgery.

## 2021-07-11 ENCOUNTER — Encounter
Admission: RE | Admit: 2021-07-11 | Discharge: 2021-07-11 | Disposition: A | Discharge status: Home / Self Care | Payer: Self-pay | Source: Ambulatory Visit | Attending: Surgery | Admitting: Surgery

## 2021-07-11 ENCOUNTER — Other Ambulatory Visit: Payer: Self-pay

## 2021-07-11 DIAGNOSIS — Z01812 Encounter for preprocedural laboratory examination: Secondary | ICD-10-CM | POA: Insufficient documentation

## 2021-07-11 HISTORY — DX: Hypomagnesemia: E83.42

## 2021-07-11 HISTORY — DX: Other specified health status: Z78.9

## 2021-07-11 HISTORY — DX: Personal history of other medical treatment: Z92.89

## 2021-07-11 HISTORY — DX: Gastro-esophageal reflux disease without esophagitis: K21.9

## 2021-07-11 NOTE — Patient Instructions (Signed)
INSTRUCTIONS FOR SURGERY     Your surgery is scheduled for:   Tuesday, September 20TH     To find out your arrival time for the day of surgery,          please call [phone] between 1 pm and 3 pm on :  Monday, September 19TH     When you arrive for surgery, report to the FIRST FLOOR OF THE MEDICAL MALL. CHECK IN AT THE REGISTRATION DESK.  ONCE THEY HAVE COMPLETED THEIR PROCESS, PROCEED TO THE SECOND FLOOR AND CHECK IN AT THE SURGICAL DESK.    REMEMBER: Instructions that are not followed completely may result in serious medical risk,  up to and including death, or upon the discretion of your surgeon and anesthesiologist,            your surgery may need to be rescheduled.  __X__ 1. Do not eat food after midnight the night before your procedure.                    No gum, candy, lozenger, tic tacs, tums or hard candies.                  ABSOLUTELY NOTHING SOLID IN YOUR MOUTH AFTER MIDNIGHT                    You may drink unlimited clear liquids up to 2 hours before you are scheduled to arrive for surgery.                   Do not drink anything within those 2 hours unless you need to take medicine, then take the                   smallest amount you need.  Clear liquids include:  water, apple juice without pulp,                   any flavor Gatorade, Black coffee, black tea.  Sugar may be added but no dairy/ honey /lemon.                        Broth and jello is not considered a clear liquid.  __x__  2. On the morning of surgery, please brush your teeth with toothpaste and water. You may rinse with                  mouthwash if you wish but DO NOT SWALLOW TOOTHPASTE OR MOUTHWASH  __X___3. NO alcohol for 24 hours before or after surgery.  __x___ 4.  Do NOT smoke or use e-cigarettes for 24 HOURS PRIOR TO SURGERY.                      DO NOT  Use any chewable tobacco products for at least 6 hours prior to surgery.  __x___ 5. If  you start any new medication after this appointment and prior to surgery, please                   Bring it with you on the day of surgery.  ___x__ 6. Notify your doctor if there is any change in your medical condition, such as fever,                   infection, vomitting, diarrhea or any open sores.  __x___ 7.  USE the CHG SOAP as instructed, the night before surgery and the day of surgery.                   Once you have washed with this soap, do NOT use any of the following: Powders, perfumes                    or lotions. Please do not wear make up, hairpins, clips or nail polish. You may wear deodorant.                                                  Women need to shave 48 hours prior to surgery.                   DO NOT wear ANY jewelry on the day of surgery. If there are rings that are too tight to                    remove easily, please address this prior to the surgery day. Piercings need to be removed.                                                                     NO METAL ON YOUR BODY.                    Do NOT bring any valuables.  If you came to Pre-Admit testing then you will not need license,                     insurance card or credit card.  If you will be staying overnight, please either leave your things in                     the car or have your family be responsible for these items.                     [facility] IS NOT RESPONSIBLE FOR BELONGINGS OR VALUABLES.  ___X__ 8. DO NOT wear contact lenses on surgery day.  You may not have dentures,                     Hearing aides, contacts or glasses in the operating room. These items can be                    Placed in the Recovery Room to receive immediately after surgery.  __x___ 9. IF YOU ARE SCHEDULED TO GO HOME ON THE SAME DAY, YOU MUST                   Have someone to drive you home and to stay with you  for the first 24 hours.  Have an arrangement prior to arriving on surgery day.  ___x__  10. Take the following medications on the morning of surgery with a sip of water:                              1. GABAPENTIN                     2. ZOFRAN, only if you need it                     3.   __X__  12. STOP ALL ASPIRIN PRODUCTS AS OF TODAY, 07/11/21.                       THIS INCLUDES BC POWDERS / GOODIES POWDER  __x___ 13. STOP Anti-inflammatories as of: TODAY, 07/11/21.                      This includes IBUPROFEN / MOTRIN / ADVIL / ALEVE/ NAPROXYN                    YOU MAY TAKE TYLENOL ANY TIME PRIOR TO SURGERY.  __X___ 14.  Stop supplements until after surgery.                     This includes: FOLIC ACID // MULTIVITAMINS // THIAMINE // VITAMIN B12 //                         FERROUS SULFATE                  ___X____18. Wear clean and comfortable clothing to the hospital. Have loose fitting bottoms.  IF YOU REMEMBER, PLEASE BRING A COPY OF YOUR MEDICAL ADVANCE DIRECTIVES. GIVE TO THE PREOP NURSE AND SHE WILL MAKE A COPY FOR YOUR CHART.  HAVE A CELL PHONE (your husband) as the operating room staff and physician use    Text messaging to keep him updated.

## 2021-07-16 ENCOUNTER — Encounter
Admission: RE | Disposition: A | Discharge status: Home / Self Care | Payer: Self-pay | Source: Home / Self Care | Attending: Surgery

## 2021-07-16 ENCOUNTER — Other Ambulatory Visit: Payer: Self-pay

## 2021-07-16 ENCOUNTER — Encounter: Payer: Self-pay | Admitting: Surgery

## 2021-07-16 ENCOUNTER — Ambulatory Visit
Admission: RE | Admit: 2021-07-16 | Discharge: 2021-07-16 | Disposition: A | Discharge status: Home / Self Care | Payer: Self-pay | Attending: Surgery | Admitting: Surgery

## 2021-07-16 ENCOUNTER — Ambulatory Visit: Payer: Self-pay | Admitting: Certified Registered"

## 2021-07-16 DIAGNOSIS — F17210 Nicotine dependence, cigarettes, uncomplicated: Secondary | ICD-10-CM | POA: Insufficient documentation

## 2021-07-16 DIAGNOSIS — K851 Biliary acute pancreatitis without necrosis or infection: Secondary | ICD-10-CM

## 2021-07-16 DIAGNOSIS — K8020 Calculus of gallbladder without cholecystitis without obstruction: Secondary | ICD-10-CM

## 2021-07-16 DIAGNOSIS — K8010 Calculus of gallbladder with chronic cholecystitis without obstruction: Secondary | ICD-10-CM | POA: Insufficient documentation

## 2021-07-16 DIAGNOSIS — I10 Essential (primary) hypertension: Secondary | ICD-10-CM | POA: Insufficient documentation

## 2021-07-16 LAB — POCT PREGNANCY, URINE: Preg Test, Ur: NEGATIVE

## 2021-07-16 SURGERY — CHOLECYSTECTOMY, ROBOT-ASSISTED, LAPAROSCOPIC
Anesthesia: General

## 2021-07-16 MED ORDER — GABAPENTIN 300 MG PO CAPS: 300.0000 mg | ORAL_CAPSULE | ORAL | Status: AC

## 2021-07-16 MED ORDER — BUPIVACAINE-EPINEPHRINE (PF) 0.25% -1:200000 IJ SOLN
INTRAMUSCULAR | Status: DC | PRN
  Administered 2021-07-16: 30 mL

## 2021-07-16 MED ORDER — CHLORHEXIDINE GLUCONATE 0.12 % MT SOLN: 15.0000 mL | Freq: Once | OROMUCOSAL | Status: AC

## 2021-07-16 MED ORDER — CHLORHEXIDINE GLUCONATE CLOTH 2 % EX PADS
6.0000 | MEDICATED_PAD | Freq: Once | CUTANEOUS | Status: AC
  Administered 2021-07-15 – 2021-07-16 (×2): 6 via TOPICAL

## 2021-07-16 MED ORDER — CEFAZOLIN SODIUM-DEXTROSE 2-4 GM/100ML-% IV SOLN
INTRAVENOUS | Status: AC
  Filled 2021-07-16: qty 100

## 2021-07-16 MED ORDER — FAMOTIDINE 20 MG PO TABS
ORAL_TABLET | ORAL | Status: AC
  Administered 2021-07-16: 20 mg via ORAL
  Filled 2021-07-16: qty 1

## 2021-07-16 MED ORDER — ROCURONIUM BROMIDE 10 MG/ML (PF) SYRINGE
PREFILLED_SYRINGE | INTRAVENOUS | Status: AC
  Filled 2021-07-16: qty 10

## 2021-07-16 MED ORDER — LACTATED RINGERS IV SOLN
INTRAVENOUS | Status: DC
Start: 2021-07-16 — End: 2021-07-16

## 2021-07-16 MED ORDER — IBUPROFEN 600 MG PO TABS: 600.0000 mg | ORAL_TABLET | Freq: Three times a day (TID) | ORAL | 1 refills | Status: DC | PRN

## 2021-07-16 MED ORDER — ACETAMINOPHEN 500 MG PO TABS
ORAL_TABLET | ORAL | Status: AC
  Administered 2021-07-16: 1000 mg via ORAL
  Filled 2021-07-16: qty 2

## 2021-07-16 MED ORDER — CHLORHEXIDINE GLUCONATE CLOTH 2 % EX PADS
6.0000 | MEDICATED_PAD | Freq: Once | CUTANEOUS | Status: AC
  Administered 2021-07-16: 6 via TOPICAL

## 2021-07-16 MED ORDER — OXYCODONE HCL 5 MG PO TABS
5.0000 mg | ORAL_TABLET | Freq: Once | ORAL | Status: AC
  Administered 2021-07-16: 5 mg via ORAL

## 2021-07-16 MED ORDER — BUPIVACAINE-EPINEPHRINE (PF) 0.25% -1:200000 IJ SOLN
INTRAMUSCULAR | Status: AC
  Filled 2021-07-16: qty 30

## 2021-07-16 MED ORDER — SUGAMMADEX SODIUM 200 MG/2ML IV SOLN
INTRAVENOUS | Status: DC | PRN
  Administered 2021-07-16: 200 mg via INTRAVENOUS

## 2021-07-16 MED ORDER — GABAPENTIN 300 MG PO CAPS
ORAL_CAPSULE | ORAL | Status: AC
  Administered 2021-07-16: 300 mg via ORAL
  Filled 2021-07-16: qty 1

## 2021-07-16 MED ORDER — ONDANSETRON HCL 4 MG/2ML IJ SOLN
4.0000 mg | Freq: Once | INTRAMUSCULAR | Status: AC | PRN
  Administered 2021-07-16: 4 mg via INTRAVENOUS

## 2021-07-16 MED ORDER — OXYCODONE HCL 5 MG PO TABS
ORAL_TABLET | ORAL | Status: AC
  Filled 2021-07-16: qty 1

## 2021-07-16 MED ORDER — FENTANYL CITRATE (PF) 100 MCG/2ML IJ SOLN
INTRAMUSCULAR | Status: DC | PRN
  Administered 2021-07-16 (×2): 50 ug via INTRAVENOUS

## 2021-07-16 MED ORDER — INDOCYANINE GREEN 25 MG IV SOLR
2.5000 mg | INTRAVENOUS | Status: AC
  Administered 2021-07-16: 2.5 mg via INTRAVENOUS
  Filled 2021-07-16: qty 1

## 2021-07-16 MED ORDER — PHENYLEPHRINE HCL (PRESSORS) 10 MG/ML IV SOLN
INTRAVENOUS | Status: DC | PRN
  Administered 2021-07-16 (×5): 100 ug via INTRAVENOUS
  Administered 2021-07-16: 50 ug via INTRAVENOUS
  Administered 2021-07-16 (×3): 100 ug via INTRAVENOUS
  Administered 2021-07-16: 50 ug via INTRAVENOUS
  Administered 2021-07-16: 200 ug via INTRAVENOUS

## 2021-07-16 MED ORDER — DEXMEDETOMIDINE (PRECEDEX) IN NS 20 MCG/5ML (4 MCG/ML) IV SYRINGE
PREFILLED_SYRINGE | INTRAVENOUS | Status: DC | PRN
  Administered 2021-07-16 (×4): 4 ug via INTRAVENOUS
  Administered 2021-07-16: 8 ug via INTRAVENOUS
  Administered 2021-07-16: 4 ug via INTRAVENOUS

## 2021-07-16 MED ORDER — 0.9 % SODIUM CHLORIDE (POUR BTL) OPTIME
TOPICAL | Status: DC | PRN
  Administered 2021-07-16: 500 mL

## 2021-07-16 MED ORDER — FENTANYL CITRATE (PF) 100 MCG/2ML IJ SOLN
25.0000 ug | INTRAMUSCULAR | Status: DC | PRN
  Administered 2021-07-16 (×4): 25 ug via INTRAVENOUS

## 2021-07-16 MED ORDER — ORAL CARE MOUTH RINSE
15.0000 mL | Freq: Once | OROMUCOSAL | Status: AC
Start: 2021-07-16 — End: 2021-07-16

## 2021-07-16 MED ORDER — MIDAZOLAM HCL 2 MG/2ML IJ SOLN
INTRAMUSCULAR | Status: DC | PRN
  Administered 2021-07-16: 2 mg via INTRAVENOUS

## 2021-07-16 MED ORDER — LIDOCAINE HCL (CARDIAC) PF 100 MG/5ML IV SOSY
PREFILLED_SYRINGE | INTRAVENOUS | Status: DC | PRN
  Administered 2021-07-16: 60 mg via INTRAVENOUS

## 2021-07-16 MED ORDER — DEXAMETHASONE SODIUM PHOSPHATE 10 MG/ML IJ SOLN
INTRAMUSCULAR | Status: DC | PRN
  Administered 2021-07-16: 10 mg via INTRAVENOUS

## 2021-07-16 MED ORDER — ONDANSETRON HCL 4 MG/2ML IJ SOLN
INTRAMUSCULAR | Status: AC
  Filled 2021-07-16: qty 2

## 2021-07-16 MED ORDER — ROCURONIUM BROMIDE 100 MG/10ML IV SOLN
INTRAVENOUS | Status: DC | PRN
  Administered 2021-07-16: 50 mg via INTRAVENOUS
  Administered 2021-07-16: 10 mg via INTRAVENOUS

## 2021-07-16 MED ORDER — OXYCODONE HCL 5 MG PO TABS: 5.0000 mg | ORAL_TABLET | ORAL | 0 refills | Status: DC | PRN

## 2021-07-16 MED ORDER — CEFAZOLIN SODIUM-DEXTROSE 2-4 GM/100ML-% IV SOLN
2.0000 g | INTRAVENOUS | Status: AC
  Administered 2021-07-16: 2 g via INTRAVENOUS

## 2021-07-16 MED ORDER — MIDAZOLAM HCL 2 MG/2ML IJ SOLN
INTRAMUSCULAR | Status: AC
  Filled 2021-07-16: qty 2

## 2021-07-16 MED ORDER — LIDOCAINE HCL (PF) 2 % IJ SOLN
INTRAMUSCULAR | Status: AC
  Filled 2021-07-16: qty 5

## 2021-07-16 MED ORDER — FAMOTIDINE 20 MG PO TABS: 20.0000 mg | ORAL_TABLET | Freq: Once | ORAL | Status: AC

## 2021-07-16 MED ORDER — CHLORHEXIDINE GLUCONATE 0.12 % MT SOLN
OROMUCOSAL | Status: AC
  Administered 2021-07-16: 15 mL via OROMUCOSAL
  Filled 2021-07-16: qty 15

## 2021-07-16 MED ORDER — FENTANYL CITRATE (PF) 100 MCG/2ML IJ SOLN
INTRAMUSCULAR | Status: AC
  Filled 2021-07-16: qty 2

## 2021-07-16 MED ORDER — KETOROLAC TROMETHAMINE 30 MG/ML IJ SOLN
INTRAMUSCULAR | Status: DC | PRN
  Administered 2021-07-16: 30 mg via INTRAVENOUS

## 2021-07-16 MED ORDER — ACETAMINOPHEN 500 MG PO TABS: 1000.0000 mg | ORAL_TABLET | Freq: Four times a day (QID) | ORAL | Status: AC | PRN

## 2021-07-16 MED ORDER — ACETAMINOPHEN 500 MG PO TABS: 1000.0000 mg | ORAL_TABLET | ORAL | Status: AC

## 2021-07-16 MED ORDER — PROPOFOL 10 MG/ML IV BOLUS
INTRAVENOUS | Status: DC | PRN
  Administered 2021-07-16: 140 mg via INTRAVENOUS

## 2021-07-16 MED ORDER — ONDANSETRON HCL 4 MG/2ML IJ SOLN
INTRAMUSCULAR | Status: DC | PRN
  Administered 2021-07-16: 4 mg via INTRAVENOUS

## 2021-07-16 SURGICAL SUPPLY — 61 items
"PENCIL ELECTRO HAND CTR " (MISCELLANEOUS) ×1 IMPLANT
ADH SKN CLS APL DERMABOND .7 (GAUZE/BANDAGES/DRESSINGS) ×1
APL PRP STRL LF DISP 70% ISPRP (MISCELLANEOUS) ×1
BAG INFUSER PRESSURE 100CC (MISCELLANEOUS) IMPLANT
BAG SPEC RTRVL LRG 6X4 10 (ENDOMECHANICALS) ×1
CANNULA REDUC XI 12-8 STAPL (CANNULA) ×1
CANNULA REDUCER 12-8 DVNC XI (CANNULA) ×1 IMPLANT
CHLORAPREP W/TINT 26 (MISCELLANEOUS) ×2 IMPLANT
CLIP LIGATING HEMO O LOK GREEN (MISCELLANEOUS) ×2 IMPLANT
CUP MEDICINE 2OZ PLAST GRAD ST (MISCELLANEOUS) ×1 IMPLANT
DECANTER SPIKE VIAL GLASS SM (MISCELLANEOUS) ×2 IMPLANT
DEFOGGER SCOPE WARMER CLEARIFY (MISCELLANEOUS) ×2 IMPLANT
DERMABOND ADVANCED (GAUZE/BANDAGES/DRESSINGS) ×1
DERMABOND ADVANCED .7 DNX12 (GAUZE/BANDAGES/DRESSINGS) ×1 IMPLANT
DRAPE ARM DVNC X/XI (DISPOSABLE) ×4 IMPLANT
DRAPE COLUMN DVNC XI (DISPOSABLE) ×1 IMPLANT
DRAPE DA VINCI XI ARM (DISPOSABLE) ×4
DRAPE DA VINCI XI COLUMN (DISPOSABLE) ×1
ELECT CAUTERY BLADE TIP 2.5 (TIP) ×2
ELECT REM PT RETURN 9FT ADLT (ELECTROSURGICAL) ×2
ELECTRODE CAUTERY BLDE TIP 2.5 (TIP) ×1 IMPLANT
ELECTRODE REM PT RTRN 9FT ADLT (ELECTROSURGICAL) ×1 IMPLANT
GAUZE 4X4 16PLY ~~LOC~~+RFID DBL (SPONGE) ×2 IMPLANT
GLOVE SURG SYN 7.0 (GLOVE) ×4 IMPLANT
GLOVE SURG SYN 7.0 PF PI (GLOVE) ×2 IMPLANT
GLOVE SURG SYN 7.5  E (GLOVE) ×2
GLOVE SURG SYN 7.5 E (GLOVE) ×2 IMPLANT
GLOVE SURG SYN 7.5 PF PI (GLOVE) ×2 IMPLANT
GOWN STRL REUS W/ TWL LRG LVL3 (GOWN DISPOSABLE) ×4 IMPLANT
GOWN STRL REUS W/TWL LRG LVL3 (GOWN DISPOSABLE) ×8
IRRIGATOR SUCT 8 DISP DVNC XI (IRRIGATION / IRRIGATOR) IMPLANT
IRRIGATOR SUCTION 8MM XI DISP (IRRIGATION / IRRIGATOR)
IV NS 1000ML (IV SOLUTION)
IV NS 1000ML BAXH (IV SOLUTION) IMPLANT
KIT PINK PAD W/HEAD ARE REST (MISCELLANEOUS) ×2
KIT PINK PAD W/HEAD ARM REST (MISCELLANEOUS) ×1 IMPLANT
LABEL OR SOLS (LABEL) ×2 IMPLANT
MANIFOLD NEPTUNE II (INSTRUMENTS) ×1 IMPLANT
NEEDLE HYPO 22GX1.5 SAFETY (NEEDLE) ×2 IMPLANT
NS IRRIG 500ML POUR BTL (IV SOLUTION) ×2 IMPLANT
OBTURATOR OPTICAL STANDARD 8MM (TROCAR) ×1
OBTURATOR OPTICAL STND 8 DVNC (TROCAR) ×1
OBTURATOR OPTICALSTD 8 DVNC (TROCAR) ×1 IMPLANT
PACK LAP CHOLECYSTECTOMY (MISCELLANEOUS) ×2 IMPLANT
PENCIL ELECTRO HAND CTR (MISCELLANEOUS) ×2 IMPLANT
POUCH SPECIMEN RETRIEVAL 10MM (ENDOMECHANICALS) ×2 IMPLANT
SEAL CANN UNIV 5-8 DVNC XI (MISCELLANEOUS) ×3 IMPLANT
SEAL XI 5MM-8MM UNIVERSAL (MISCELLANEOUS) ×3
SET TUBE SMOKE EVAC HIGH FLOW (TUBING) ×2 IMPLANT
SOLUTION ELECTROLUBE (MISCELLANEOUS) ×2 IMPLANT
SPONGE T-LAP 18X18 ~~LOC~~+RFID (SPONGE) IMPLANT
SPONGE T-LAP 4X18 ~~LOC~~+RFID (SPONGE) ×1 IMPLANT
STAPLER CANNULA SEAL DVNC XI (STAPLE) ×1 IMPLANT
STAPLER CANNULA SEAL XI (STAPLE) ×1
SUT MNCRL AB 4-0 PS2 18 (SUTURE) ×2 IMPLANT
SUT VIC AB 3-0 SH 27 (SUTURE)
SUT VIC AB 3-0 SH 27X BRD (SUTURE) IMPLANT
SUT VICRYL 0 AB UR-6 (SUTURE) ×4 IMPLANT
TAPE TRANSPORE STRL 2 31045 (GAUZE/BANDAGES/DRESSINGS) ×2 IMPLANT
TROCAR BALLN GELPORT 12X130M (ENDOMECHANICALS) ×2 IMPLANT
WATER STERILE IRR 500ML POUR (IV SOLUTION) ×2 IMPLANT

## 2021-07-16 NOTE — Anesthesia Procedure Notes (Signed)
Procedure Name: Intubation Date/Time: 07/16/2021 12:43 PM Performed by: Guadlupe Spanish, RN Pre-anesthesia Checklist: Patient identified, Patient being monitored, Timeout performed, Emergency Drugs available and Suction available Patient Re-evaluated:Patient Re-evaluated prior to induction Oxygen Delivery Method: Circle system utilized Preoxygenation: Pre-oxygenation with 100% oxygen Induction Type: IV induction Ventilation: Mask ventilation without difficulty Laryngoscope Size: Mac and 3 Grade View: Grade I Tube type: Oral Tube size: 7.0 mm Number of attempts: 1 Airway Equipment and Method: Stylet Placement Confirmation: ETT inserted through vocal cords under direct vision, positive ETCO2 and breath sounds checked- equal and bilateral Secured at: 21 cm Tube secured with: Tape Dental Injury: Teeth and Oropharynx as per pre-operative assessment

## 2021-07-16 NOTE — Transfer of Care (Signed)
Immediate Anesthesia Transfer of Care Note  Patient: [facility]  Procedure(s) Performed: XI ROBOTIC ASSISTED LAPAROSCOPIC CHOLECYSTECTOMY INDOCYANINE GREEN FLUORESCENCE IMAGING (ICG)  Patient Location: PACU  Anesthesia Type:General  Level of Consciousness: drowsy and patient cooperative  Airway & Oxygen Therapy: Patient Spontanous Breathing and Patient connected to face mask oxygen  Post-op Assessment: Report given to RN and Post -op Vital signs reviewed and stable  Post vital signs: Reviewed and stable  Last Vitals:  Vitals Value Taken Time  BP 90/53 07/16/21 1438  Temp    Pulse 76 07/16/21 1441  Resp 13 07/16/21 1441  SpO2 100 % 07/16/21 1441  Vitals shown include unvalidated device data.  Last Pain:  Vitals:   07/16/21 1044  TempSrc: Temporal  PainSc: 0-No pain      Patients Stated Pain Goal: 0 (07/16/21 1044)  Complications: No notable events documented.

## 2021-07-16 NOTE — Anesthesia Preprocedure Evaluation (Signed)
Anesthesia Evaluation  Patient identified by MRN, date of birth, ID band Patient awake    Reviewed: Allergy & Precautions, H&P , NPO status , Patient's Chart, lab work & pertinent test results, reviewed documented beta blocker date and time   Airway Mallampati: III  TM Distance: >3 FB Neck ROM: full    Dental  (+) Teeth Intact   Pulmonary neg pulmonary ROS, Current Smoker,    Pulmonary exam normal        Cardiovascular Exercise Tolerance: Good hypertension, On Medications negative cardio ROS Normal cardiovascular exam Rhythm:regular Rate:Normal     Neuro/Psych negative neurological ROS  negative psych ROS   GI/Hepatic Neg liver ROS, hiatal hernia, GERD  Medicated,  Endo/Other  negative endocrine ROS  Renal/GU negative Renal ROS  negative genitourinary   Musculoskeletal   Abdominal   Peds  Hematology  (+) Blood dyscrasia, anemia ,   Anesthesia Other Findings Past Medical History: 05/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 pylori infection     Comment:  stopped medication for this as it may have effected               pancreatitis No date: History of blood transfusion No date: Hypertension     Comment:  has never been treated for htn No date: Hypomagnesemia No date: Takes iron supplements Past Surgical History: 03/15/2021: COLONOSCOPY; N/A     Comment:  Procedure: COLONOSCOPY;  Surgeon: Pasty Spillers,               MD;  Location: [facility] ENDOSCOPY;  Service: Endoscopy;                Laterality: N/A; 03/15/2021: ESOPHAGOGASTRODUODENOSCOPY; N/A     Comment:  Procedure: ESOPHAGOGASTRODUODENOSCOPY (EGD);  Surgeon:               Pasty Spillers, MD;  Location: [facility] ENDOSCOPY;                Service: Endoscopy;  Laterality: N/A; 1999: MANDIBLE  FRACTURE SURGERY     Comment:  unsure if any metal. does not think so BMI    Body Mass Index: 20.83 kg/m     Reproductive/Obstetrics negative OB ROS                             Anesthesia Physical Anesthesia Plan  ASA: 2  Anesthesia Plan: General ETT   Post-op Pain Management:    Induction:   PONV Risk Score and Plan:   Airway Management Planned:   Additional Equipment:   Intra-op Plan:   Post-operative Plan:   Informed Consent: I have reviewed the patients History and Physical, chart, labs and discussed the procedure including the risks, benefits and alternatives for the proposed anesthesia with the patient or authorized representative who has indicated his/her understanding and acceptance.     Dental Advisory Given  Plan Discussed with: CRNA  Anesthesia Plan Comments:         Anesthesia Quick Evaluation

## 2021-07-16 NOTE — Op Note (Signed)
  Procedure Date:  07/16/2021  Pre-operative Diagnosis:  Symptomatic cholelithiasis  Post-operative Diagnosis:  Symptomatic cholelithiasis  Procedure:  Robotic assisted cholecystectomy with ICG FireFly cholangiogram  Surgeon:  Howie Ill, MD  Assistant:  Stephan Minister, PA-S  Anesthesia:  General endotracheal  Estimated Blood Loss:  5 ml  Specimens:  gallbladder  Complications:  None  Indications for Procedure:  This is a 35 y.o. female who presents with abdominal pain and workup revealing symptomatic cholelithiasis.  The benefits, complications, treatment options, and expected outcomes were discussed with the patient. The risks of bleeding, infection, recurrence of symptoms, failure to resolve symptoms, bile duct damage, bile duct leak, retained common bile duct stone, bowel injury, and need for further procedures were all discussed with the patient and she was willing to proceed.  Description of Procedure: The patient was correctly identified in the preoperative area and brought into the operating room.  The patient was placed supine with VTE prophylaxis in place.  Appropriate time-outs were performed.  Anesthesia was induced and the patient was intubated.  Appropriate antibiotics were infused.  The abdomen was prepped and draped in a sterile fashion. An infraumbilical incision was made. A cutdown technique was used to enter the abdominal cavity without injury, and a 12 mm robotic port was inserted.  Pneumoperitoneum was obtained with appropriate opening pressures.  Three 8-mm ports were placed in the mid abdomen at the level of the umbilicus under direct visualization.  The DaVinci platform was docked, camera targeted, and instruments were placed under direct visualization.  The gallbladder was identified.  The fundus was grasped and retracted cephalad.  Adhesions were lysed bluntly and with electrocautery. The infundibulum was grasped and retracted laterally, exposing the peritoneum  overlying the gallbladder.  This was incised with electrocautery and extended on either side of the gallbladder.  FireFly cholangiogram was then obtained, and we were able to clearly identify the cystic duct and common bile duct.  The cystic duct was long and ran parallel to the common bile duct.  The cystic duct and cystic artery were carefully dissected with combination of cautery and blunt dissection.  Both were clipped twice proximally and once distally, cutting in between.  The gallbladder was taken from the gallbladder fossa in a retrograde fashion with electrocautery. The gallbladder was placed in an Endocatch bag. The liver bed was inspected and any bleeding was controlled with electrocautery. The right upper quadrant was then inspected again revealing intact clips, no bleeding, and no ductal injury.  The 8 mm ports were removed under direct visualization and the 12 mm port was removed.  The Endocatch bag was brought out via the umbilical incision. The fascial opening was closed using 0 vicryl suture.  Local anesthetic was infused in all incisions.  The umbilical incision was closed with 3-0 Vicryl and 4-0 Monocryl, and the remaining incisions were closed with 4-0 Monocryl.  The wounds were cleaned and sealed with DermaBond.  The patient was emerged from anesthesia and extubated and brought to the recovery room for further management.  The patient tolerated the procedure well and all counts were correct at the end of the case.   Howie Ill, MD

## 2021-07-16 NOTE — Interval H&P Note (Signed)
History and Physical Interval Note:  07/16/2021 12:14 PM  [name]  has presented today for surgery, with the diagnosis of gallstone pancreatitis.  The various methods of treatment have been discussed with the patient and family. After consideration of risks, benefits and other options for treatment, the patient has consented to  Procedure(s): XI ROBOTIC ASSISTED LAPAROSCOPIC CHOLECYSTECTOMY (N/A) INDOCYANINE GREEN FLUORESCENCE IMAGING (ICG) (N/A) as a surgical intervention.  The patient's history has been reviewed, patient examined, no change in status, stable for surgery.  I have reviewed the patient's chart and labs.  Questions were answered to the patient's satisfaction.     Sandra Pearson

## 2021-07-17 ENCOUNTER — Telehealth: Payer: Self-pay

## 2021-07-17 NOTE — Telephone Encounter (Signed)
Laparoscopic Cholecystectomy 07/16/21 Dr Candie Chroman states  she is taking oxycodone every 4 hours since last night. In a lot of pain.  Denies fever. We discussed taking three ibuprofen now and 6 hours take another three and continue prescribed Oxycodone as scheduled. Patient has not had bowel movement-we discussed taking Miralx over the next few days until she has bowel movement-she stated she has pains in her chest and shoulders and we discussed moving about to help work the gas out. Patient will call if no better tomorrow.

## 2021-07-18 LAB — SURGICAL PATHOLOGY

## 2021-07-20 NOTE — Progress Notes (Deleted)
[facility]  Telephone:(336) [phone] [phone]) [phone]  ID: Sandra Pearson OB: [birth year]  MR#: [id number]  IRS#:[id number]  Patient Care Team: Claiborne Rigg, NP as PCP - General (Nurse Practitioner)  CHIEF COMPLAINT: Iron deficiency anemia.  INTERVAL HISTORY: Patient is a 35 year old female noted to have persistent iron deficiency anemia despite oral iron supplementation.  She currently feels well and is asymptomatic.  She has no neurologic complaints.  She denies any recent fevers or illnesses.  She has a good appetite and denies weight loss.  She has no chest pain, shortness of breath, cough, or hemoptysis.  She denies any nausea, vomiting, constipation, or diarrhea.  She has no melena or hematochezia.  She has no urinary complaints.  Patient feels at her baseline and offers no specific complaints today.  REVIEW OF SYSTEMS:   Review of Systems  Constitutional: Negative.  Negative for fever, malaise/fatigue and weight loss.  Respiratory: Negative.  Negative for cough, hemoptysis and shortness of breath.   Cardiovascular: Negative.  Negative for chest pain and leg swelling.  Gastrointestinal: Negative.  Negative for abdominal pain, blood in stool and melena.  Genitourinary: Negative.  Negative for hematuria.  Musculoskeletal: Negative.  Negative for back pain.  Skin: Negative.  Negative for rash.  Neurological: Negative.  Negative for dizziness, focal weakness, weakness and headaches.  Psychiatric/Behavioral: Negative.  The patient is not nervous/anxious.    As per HPI. Otherwise, a complete review of systems is negative.  PAST MEDICAL HISTORY: Past Medical History:  Diagnosis Date   Acute necrotizing pancreatitis 05/2021   Anemia    was iron deficiency but labs improved   GERD (gastroesophageal reflux disease)    with esophagitis   H. pylori infection 05/2021   stopped medication for this as it may have effected pancreatitis   History of blood  transfusion    Hypertension    has never been treated for htn   Hypomagnesemia    Takes iron supplements     PAST SURGICAL HISTORY: Past Surgical History:  Procedure Laterality Date   COLONOSCOPY N/A 03/15/2021   Procedure: COLONOSCOPY;  Surgeon: Pasty Spillers, MD;  Location: [facility] ENDOSCOPY;  Service: Endoscopy;  Laterality: N/A;   ESOPHAGOGASTRODUODENOSCOPY N/A 03/15/2021   Procedure: ESOPHAGOGASTRODUODENOSCOPY (EGD);  Surgeon: Pasty Spillers, MD;  Location: [facility] ENDOSCOPY;  Service: Endoscopy;  Laterality: N/A;   MANDIBLE FRACTURE SURGERY  1999   unsure if any metal. does not think so    FAMILY HISTORY: Family History  Problem Relation Age of Onset   Hepatitis C Mother     ADVANCED DIRECTIVES (Y/N):  N  HEALTH MAINTENANCE: Social History   Tobacco Use   Smoking status: Every Day    Packs/day: 0.50    Types: Cigarettes   Smokeless tobacco: Never  Vaping Use   Vaping Use: Some days  Substance Use Topics   Alcohol use: No    Comment: rarely    Drug use: No     Colonoscopy:  PAP:  Bone density:  Lipid panel:  No Known Allergies  Current Outpatient Medications  Medication Sig Dispense Refill   acetaminophen (TYLENOL) 500 MG tablet Take 2 tablets (1,000 mg total) by mouth every 6 (six) hours as needed for mild pain.     ferrous sulfate 325 (65 FE) MG tablet Take 1 tablet (325 mg total) by mouth daily. 30 tablet 0   folic acid (FOLVITE) 1 MG tablet Take 1 tablet (1 mg total) by mouth daily. 90 tablet 3  gabapentin (NEURONTIN) 300 MG capsule Take 1 capsule (300 mg total) by mouth 3 (three) times daily. 90 capsule 3   ibuprofen (ADVIL) 600 MG tablet Take 1 tablet (600 mg total) by mouth every 8 (eight) hours as needed for moderate pain. 60 tablet 1   Multiple Vitamins-Minerals (WOMENS 50+ MULTI VITAMIN/MIN PO) Take 1 tablet by mouth daily.     ondansetron (ZOFRAN ODT) 4 MG disintegrating tablet Take 1 tablet (4 mg total) by mouth every 8 (eight) hours  as needed for nausea or vomiting. 20 tablet 0   oxyCODONE (OXY IR/ROXICODONE) 5 MG immediate release tablet Take 1 tablet (5 mg total) by mouth every 4 (four) hours as needed for severe pain. 30 tablet 0   thiamine 100 MG tablet Take 1 tablet (100 mg total) by mouth daily. 30 tablet 0   vitamin B-12 (CYANOCOBALAMIN) 1000 MCG tablet Take 1 tablet (1,000 mcg total) by mouth daily. 30 tablet 0   No current facility-administered medications for this visit.    OBJECTIVE: There were no vitals filed for this visit.    There is no height or weight on file to calculate BMI.    ECOG FS:0 - Asymptomatic  General: Well-developed, well-nourished, no acute distress. Eyes: Pink conjunctiva, anicteric sclera. HEENT: Normocephalic, moist mucous membranes. Lungs: No audible wheezing or coughing. Heart: Regular rate and rhythm. Abdomen: Soft, nontender, no obvious distention. Musculoskeletal: No edema, cyanosis, or clubbing. Neuro: Alert, answering all questions appropriately. Cranial nerves grossly intact. Skin: No rashes or petechiae noted. Psych: Normal affect. Lymphatics: No cervical, calvicular, axillary or inguinal LAD.   LAB RESULTS:  Lab Results  Component Value Date   NA 135 06/23/2021   K 4.2 06/23/2021   CL 99 06/23/2021   CO2 28 06/23/2021   GLUCOSE 106 (H) 06/23/2021   BUN 10 06/23/2021   CREATININE 0.68 06/23/2021   CALCIUM 9.8 06/23/2021   PROT 7.4 06/23/2021   ALBUMIN 4.2 06/23/2021   AST 32 06/23/2021   ALT 22 06/23/2021   ALKPHOS 82 06/23/2021   BILITOT 0.8 06/23/2021   GFRNONAA >60 06/23/2021   GFRAA >60 03/17/2020    Lab Results  Component Value Date   WBC 5.2 06/23/2021   NEUTROABS 2.4 05/15/2021   HGB 14.3 06/23/2021   HCT 42.1 06/23/2021   MCV 89.2 06/23/2021   PLT 203 06/23/2021   Lab Results  Component Value Date   IRON 199 (H) 04/23/2021   TIBC 395 04/23/2021   IRONPCTSAT 50 (H) 04/23/2021   Lab Results  Component Value Date   FERRITIN 23  04/23/2021     STUDIES: US ABDOMEN LIMITED RUQ (LIVER/GB)  Result Date: 06/24/2021 CLINICAL DATA:  35 year old female with epigastric pain for 2 days. EXAM: ULTRASOUND ABDOMEN LIMITED RIGHT UPPER QUADRANT COMPARISON:  CT Abdomen and Pelvis 04/30/2021. FINDINGS: Gallbladder: Gallstones and tumefactive sludge within a partially contracted gallbladder (image 12). Gallbladder wall thickness remains normal to 2 mm. Estimated stone size 14 mm (image 38). No pericholecystic fluid. No sonographic Murphy sign elicited. Common bile duct: Diameter: 4 mm, normal. Liver: Background liver echogenicity is within normal limits. Mildly coarse hepatic echotexture but no discrete liver lesion. No intrahepatic biliary ductal dilatation. Portal vein is patent on color Doppler imaging with normal direction of blood flow towards the liver. Other: Negative visible right kidney. IMPRESSION: Positive for gallstones and sludge. No evidence of acute cholecystitis or bile duct obstruction. Electronically Signed   By: Odessa Fleming M.D.   On: 06/24/2021 04:57  ASSESSMENT: Iron deficiency anemia.  PLAN:    Iron deficiency anemia: Appears to have resolved.  Patient's hemoglobin and iron stores are now within normal limits.  B12, folate, and reticulocyte count are all also within normal limits.  She does not require a IV Venofer at this time.  She has been instructed to continue oral iron supplementation.  Return to clinic in 3 months with repeat laboratory work, further evaluation, and consideration of treatment if needed.  I spent a total of 45 minutes reviewing chart data, face-to-face evaluation with the patient, counseling and coordination of care as detailed above.   Patient expressed understanding and was in agreement with this plan. She also understands that She can call clinic at any time with any questions, concerns, or complaints.    Jeralyn Ruths, MD   07/20/2021 7:45 PM

## 2021-07-22 ENCOUNTER — Other Ambulatory Visit: Payer: Self-pay

## 2021-07-22 DIAGNOSIS — D509 Iron deficiency anemia, unspecified: Secondary | ICD-10-CM

## 2021-07-23 ENCOUNTER — Other Ambulatory Visit: Payer: Self-pay

## 2021-07-23 ENCOUNTER — Inpatient Hospital Stay: Payer: Self-pay | Attending: Oncology

## 2021-07-23 DIAGNOSIS — D509 Iron deficiency anemia, unspecified: Secondary | ICD-10-CM | POA: Insufficient documentation

## 2021-07-23 LAB — CBC WITH DIFFERENTIAL/PLATELET
Abs Immature Granulocytes: 0.02 10*3/uL (ref 0.00–0.07)
Basophils Absolute: 0 10*3/uL (ref 0.0–0.1)
Basophils Relative: 1 %
Eosinophils Absolute: 0.1 10*3/uL (ref 0.0–0.5)
Eosinophils Relative: 2 %
HCT: 41.3 % (ref 36.0–46.0)
Hemoglobin: 13.5 g/dL (ref 12.0–15.0)
Immature Granulocytes: 0 %
Lymphocytes Relative: 30 %
Lymphs Abs: 2 10*3/uL (ref 0.7–4.0)
MCH: 28.8 pg (ref 26.0–34.0)
MCHC: 32.7 g/dL (ref 30.0–36.0)
MCV: 88.2 fL (ref 80.0–100.0)
Monocytes Absolute: 0.4 10*3/uL (ref 0.1–1.0)
Monocytes Relative: 6 %
Neutro Abs: 4.1 10*3/uL (ref 1.7–7.7)
Neutrophils Relative %: 61 %
Platelets: 231 10*3/uL (ref 150–400)
RBC: 4.68 MIL/uL (ref 3.87–5.11)
RDW: 13.1 % (ref 11.5–15.5)
WBC: 6.7 10*3/uL (ref 4.0–10.5)
nRBC: 0 % (ref 0.0–0.2)

## 2021-07-23 LAB — IRON AND TIBC
Iron: 105 ug/dL (ref 28–170)
Saturation Ratios: 35 % — ABNORMAL HIGH (ref 10.4–31.8)
TIBC: 300 ug/dL (ref 250–450)
UIBC: 195 ug/dL

## 2021-07-23 LAB — FERRITIN: Ferritin: 33 ng/mL (ref 11–307)

## 2021-07-23 NOTE — Anesthesia Postprocedure Evaluation (Signed)
Anesthesia Post Note  Patient: [name]  Procedure(s) Performed: XI ROBOTIC ASSISTED LAPAROSCOPIC CHOLECYSTECTOMY INDOCYANINE GREEN FLUORESCENCE IMAGING (ICG)  Patient location during evaluation: PACU Anesthesia Type: General Level of consciousness: awake and alert Pain management: pain level controlled Vital Signs Assessment: post-procedure vital signs reviewed and stable Respiratory status: spontaneous breathing, nonlabored ventilation, respiratory function stable and patient connected to nasal cannula oxygen Cardiovascular status: blood pressure returned to baseline and stable Postop Assessment: no apparent nausea or vomiting Anesthetic complications: no   No notable events documented.   Last Vitals:  Vitals:   07/16/21 1529 07/16/21 1559  BP:  95/67  Pulse: 78 78  Resp: 19 15  Temp: 36.5 C 36.9 C  SpO2: 100% 100%    Last Pain:  Vitals:   07/17/21 0919  TempSrc:   PainSc: 0-No pain                 Sandra Pearson

## 2021-07-25 ENCOUNTER — Inpatient Hospital Stay: Payer: Self-pay

## 2021-07-25 ENCOUNTER — Inpatient Hospital Stay: Payer: Self-pay | Admitting: Oncology

## 2021-07-25 DIAGNOSIS — D509 Iron deficiency anemia, unspecified: Secondary | ICD-10-CM

## 2021-07-29 NOTE — Progress Notes (Signed)
[facility]  Telephone:(336) [phone] Fax:(336) [phone]  ID: Sandra Pearson OB: [birth year]  MR#: [id number]  DVV#:[id number]  Patient Care Team: Claiborne Rigg, NP as PCP - General (Nurse Practitioner)  CHIEF COMPLAINT: Iron deficiency anemia.  INTERVAL HISTORY: Patient returns to clinic today for repeat laboratory work and further evaluation.  She continues to feel well and remains asymptomatic.  She denies any weakness or fatigue. She has no neurologic complaints.  She denies any recent fevers or illnesses.  She has a good appetite and denies weight loss.  She has no chest pain, shortness of breath, cough, or hemoptysis.  She denies any nausea, vomiting, constipation, or diarrhea.  She has no melena or hematochezia.  She has no urinary complaints.  Patient offers no specific complaints today.  REVIEW OF SYSTEMS:   Review of Systems  Constitutional: Negative.  Negative for fever, malaise/fatigue and weight loss.  Respiratory: Negative.  Negative for cough, hemoptysis and shortness of breath.   Cardiovascular: Negative.  Negative for chest pain and leg swelling.  Gastrointestinal: Negative.  Negative for abdominal pain, blood in stool and melena.  Genitourinary: Negative.  Negative for hematuria.  Musculoskeletal: Negative.  Negative for back pain.  Skin: Negative.  Negative for rash.  Neurological: Negative.  Negative for dizziness, focal weakness, weakness and headaches.  Psychiatric/Behavioral: Negative.  The patient is not nervous/anxious.    As per HPI. Otherwise, a complete review of systems is negative.  PAST MEDICAL HISTORY: Past Medical History:  Diagnosis Date   Acute necrotizing pancreatitis 05/2021   Anemia    was iron deficiency but labs improved   GERD (gastroesophageal reflux disease)    with esophagitis   H. pylori infection 05/2021   stopped medication for this as it may have effected pancreatitis   History of blood transfusion     Hypertension    has never been treated for htn   Hypomagnesemia    Takes iron supplements     PAST SURGICAL HISTORY: Past Surgical History:  Procedure Laterality Date   COLONOSCOPY N/A 03/15/2021   Procedure: COLONOSCOPY;  Surgeon: Pasty Spillers, MD;  Location: [facility] ENDOSCOPY;  Service: Endoscopy;  Laterality: N/A;   ESOPHAGOGASTRODUODENOSCOPY N/A 03/15/2021   Procedure: ESOPHAGOGASTRODUODENOSCOPY (EGD);  Surgeon: Pasty Spillers, MD;  Location: [facility] ENDOSCOPY;  Service: Endoscopy;  Laterality: N/A;   MANDIBLE FRACTURE SURGERY  1999   unsure if any metal. does not think so    FAMILY HISTORY: Family History  Problem Relation Age of Onset   Hepatitis C Mother     ADVANCED DIRECTIVES (Y/N):  N  HEALTH MAINTENANCE: Social History   Tobacco Use   Smoking status: Every Day    Packs/day: 0.50    Types: Cigarettes   Smokeless tobacco: Never  Vaping Use   Vaping Use: Some days  Substance Use Topics   Alcohol use: No    Comment: rarely    Drug use: No     Colonoscopy:  PAP:  Bone density:  Lipid panel:  No Known Allergies  Current Outpatient Medications  Medication Sig Dispense Refill   acetaminophen (TYLENOL) 500 MG tablet Take 2 tablets (1,000 mg total) by mouth every 6 (six) hours as needed for mild pain.     folic acid (FOLVITE) 1 MG tablet Take 1 tablet (1 mg total) by mouth daily. 90 tablet 3   gabapentin (NEURONTIN) 300 MG capsule Take 2 capsules (600 mg total) by mouth 3 (three) times daily. 180 capsule 3  ibuprofen (ADVIL) 600 MG tablet Take 1 tablet (600 mg total) by mouth every 8 (eight) hours as needed for moderate pain. 60 tablet 1   Multiple Vitamins-Minerals (WOMENS 50+ MULTI VITAMIN/MIN PO) Take 1 tablet by mouth daily.     ondansetron (ZOFRAN ODT) 4 MG disintegrating tablet Take 1 tablet (4 mg total) by mouth every 8 (eight) hours as needed for nausea or vomiting. 20 tablet 0   thiamine 100 MG tablet Take 1 tablet (100 mg total) by mouth  daily. 30 tablet 0   ferrous sulfate 325 (65 FE) MG tablet Take 1 tablet (325 mg total) by mouth daily. 30 tablet 0   vitamin B-12 (CYANOCOBALAMIN) 1000 MCG tablet Take 1 tablet (1,000 mcg total) by mouth daily. (Patient not taking: No sig reported) 30 tablet 0   No current facility-administered medications for this visit.    OBJECTIVE: Vitals:   07/30/21 1304  BP: 113/73  Pulse: 92  Resp: 16  Temp: 97.6 F (36.4 C)  SpO2: 99%     Body mass index is 20.18 kg/m.    ECOG FS:0 - Asymptomatic  General: Well-developed, well-nourished, no acute distress. Eyes: Pink conjunctiva, anicteric sclera. HEENT: Normocephalic, moist mucous membranes. Lungs: No audible wheezing or coughing. Heart: Regular rate and rhythm. Abdomen: Soft, nontender, no obvious distention. Musculoskeletal: No edema, cyanosis, or clubbing. Neuro: Alert, answering all questions appropriately. Cranial nerves grossly intact. Skin: No rashes or petechiae noted. Psych: Normal affect.   LAB RESULTS:  Lab Results  Component Value Date   NA 135 06/23/2021   K 4.2 06/23/2021   CL 99 06/23/2021   CO2 28 06/23/2021   GLUCOSE 106 (H) 06/23/2021   BUN 10 06/23/2021   CREATININE 0.68 06/23/2021   CALCIUM 9.8 06/23/2021   PROT 7.4 06/23/2021   ALBUMIN 4.2 06/23/2021   AST 32 06/23/2021   ALT 22 06/23/2021   ALKPHOS 82 06/23/2021   BILITOT 0.8 06/23/2021   GFRNONAA >60 06/23/2021   GFRAA >60 03/17/2020    Lab Results  Component Value Date   WBC 6.7 07/23/2021   NEUTROABS 4.1 07/23/2021   HGB 13.5 07/23/2021   HCT 41.3 07/23/2021   MCV 88.2 07/23/2021   PLT 231 07/23/2021   Lab Results  Component Value Date   IRON 105 07/23/2021   TIBC 300 07/23/2021   IRONPCTSAT 35 (H) 07/23/2021   Lab Results  Component Value Date   FERRITIN 33 07/23/2021     STUDIES: No results found.  ASSESSMENT: Iron deficiency anemia.  PLAN:    Iron deficiency anemia: Patient's hemoglobin and iron stores continue to  be within normal limits.  Previously, all of her other laboratory work was also either negative or within normal limits.  Appears to have resolved.  Patient's hemoglobin and iron stores are now within normal limits.  Patient only received 1 dose of IV Venofer on May 02, 2021.  Continue oral iron supplementation.  No further follow-up is necessary.  Please refer patient back if there are any questions or concerns.    I spent a total of 20 minutes reviewing chart data, face-to-face evaluation with the patient, counseling and coordination of care as detailed above.   Patient expressed understanding and was in agreement with this plan. She also understands that She can call clinic at any time with any questions, concerns, or complaints.    Jeralyn Ruths, MD   07/31/2021 11:50 AM

## 2021-07-30 ENCOUNTER — Other Ambulatory Visit: Payer: Self-pay

## 2021-07-30 ENCOUNTER — Encounter: Payer: Self-pay | Admitting: Oncology

## 2021-07-30 ENCOUNTER — Inpatient Hospital Stay: Payer: Self-pay

## 2021-07-30 ENCOUNTER — Ambulatory Visit: Payer: Self-pay | Attending: Nurse Practitioner | Admitting: Nurse Practitioner

## 2021-07-30 ENCOUNTER — Inpatient Hospital Stay: Payer: Self-pay | Attending: Oncology | Admitting: Oncology

## 2021-07-30 ENCOUNTER — Other Ambulatory Visit: Payer: Self-pay | Admitting: Nurse Practitioner

## 2021-07-30 ENCOUNTER — Encounter: Payer: Self-pay | Admitting: Nurse Practitioner

## 2021-07-30 VITALS — BP 113/73 | HR 92 | Temp 97.6°F | Resp 16 | Wt 132.7 lb

## 2021-07-30 DIAGNOSIS — D500 Iron deficiency anemia secondary to blood loss (chronic): Secondary | ICD-10-CM

## 2021-07-30 DIAGNOSIS — R748 Abnormal levels of other serum enzymes: Secondary | ICD-10-CM

## 2021-07-30 DIAGNOSIS — E559 Vitamin D deficiency, unspecified: Secondary | ICD-10-CM

## 2021-07-30 DIAGNOSIS — F17210 Nicotine dependence, cigarettes, uncomplicated: Secondary | ICD-10-CM | POA: Insufficient documentation

## 2021-07-30 DIAGNOSIS — G63 Polyneuropathy in diseases classified elsewhere: Secondary | ICD-10-CM

## 2021-07-30 DIAGNOSIS — D509 Iron deficiency anemia, unspecified: Secondary | ICD-10-CM | POA: Insufficient documentation

## 2021-07-30 MED ORDER — GABAPENTIN 300 MG PO CAPS
600.0000 mg | ORAL_CAPSULE | Freq: Three times a day (TID) | ORAL | 3 refills | Status: DC
  Filled 2021-07-30: qty 180, 30d supply, fill #0
  Filled 2021-09-03: qty 180, 30d supply, fill #1
  Filled 2021-10-04: qty 180, 30d supply, fill #2
  Filled 2021-11-06: qty 180, 30d supply, fill #0
  Filled 2021-11-06: qty 180, 30d supply, fill #3

## 2021-07-30 NOTE — Progress Notes (Signed)
Pt has no concerns at this time. Pt does endorse pain in her feet but states this is a recurring issue

## 2021-07-30 NOTE — Progress Notes (Signed)
Virtual Visit via Telephone Note Due to national recommendations of social distancing due to COVID 19, telehealth visit is felt to be most appropriate for this patient at this time.  I discussed the limitations, risks, security and privacy concerns of performing an evaluation and management service by telephone and the availability of in person appointments. I also discussed with the patient that there may be a patient responsible charge related to this service. The patient expressed understanding and agreed to proceed.    I connected with Sandra Pearson on 07/30/21  at  10:50 AM EDT  EDT by telephone and verified that I am speaking with the correct person using two identifiers.  Location of Patient: Private Residence   Location of Provider: [facility]    Persons participating in Telemedicine visit: Bertram Denver FNP-BC [place]    History of Present Illness: Telemedicine visit for: Follow up to polyneuropathy She has a past medical history of Acute necrotizing pancreatitis (05/2021), Anemia, GERD, H. pylori infection (05/2021), History of blood transfusion, Hypertension, Hypomagnesemia, and Takes iron supplements.   Polyneuropathy Does not feel gabapentin 300 mg TID is effective. States BLE hypersensitivity seems to be worsening. Difficult to walk up and down stairs without pain in both legs and feet.   She has increased b12 levels. Has stopped taking OTC B12 1000 mcg daily. She does have a history of IDA however there are no labs indicating B12 deficiency in her chart.    Past Medical History:  Diagnosis Date   Acute necrotizing pancreatitis 05/2021   Anemia    was iron deficiency but labs improved   GERD (gastroesophageal reflux disease)    with esophagitis   H. pylori infection 05/2021   stopped medication for this as it may have effected pancreatitis   History of blood transfusion    Hypertension    has never been  treated for htn   Hypomagnesemia    Takes iron supplements     Past Surgical History:  Procedure Laterality Date   COLONOSCOPY N/A 03/15/2021   Procedure: COLONOSCOPY;  Surgeon: Pasty Spillers, MD;  Location: [facility] ENDOSCOPY;  Service: Endoscopy;  Laterality: N/A;   ESOPHAGOGASTRODUODENOSCOPY N/A 03/15/2021   Procedure: ESOPHAGOGASTRODUODENOSCOPY (EGD);  Surgeon: Pasty Spillers, MD;  Location: [facility] ENDOSCOPY;  Service: Endoscopy;  Laterality: N/A;   MANDIBLE FRACTURE SURGERY  1999   unsure if any metal. does not think so    Family History  Problem Relation Age of Onset   Hepatitis C Mother     Social History   Socioeconomic History   Marital status: Married    Spouse name: Jeannett Senior   Number of children: Not on file   Years of education: Not on file   Highest education level: Not on file  Occupational History   Occupation: homemaker  Tobacco Use   Smoking status: Every Day    Packs/day: 0.50    Types: Cigarettes   Smokeless tobacco: Never  Vaping Use   Vaping Use: Some days  Substance and Sexual Activity   Alcohol use: No    Comment: rarely    Drug use: No   Sexual activity: Not Currently    Birth control/protection: None  Other Topics Concern   Not on file  Social History Narrative   Patient lives in her home with her husband. She states that she feels safe in her home.   She does not work. Has a little puppy.   Social Determinants of Health  Financial Resource Strain: Not on file  Food Insecurity: Not on file  Transportation Needs: Not on file  Physical Activity: Not on file  Stress: Not on file  Social Connections: Not on file     Observations/Objective: Awake, alert and oriented x 3   Review of Systems  Constitutional:  Negative for fever, malaise/fatigue and weight loss.  HENT: Negative.  Negative for nosebleeds.   Eyes: Negative.  Negative for blurred vision, double vision and photophobia.  Respiratory: Negative.  Negative for cough and  shortness of breath.   Cardiovascular: Negative.  Negative for chest pain, palpitations and leg swelling.  Gastrointestinal: Negative.  Negative for heartburn, nausea and vomiting.  Musculoskeletal: Negative.  Negative for myalgias.  Neurological:  Positive for tingling and sensory change. Negative for dizziness, focal weakness, seizures and headaches.  Psychiatric/Behavioral: Negative.  Negative for suicidal ideas.    Assessment and Plan: Diagnoses and all orders for this visit:  Polyneuropathy associated with underlying disease (HCC) -     gabapentin (NEURONTIN) 300 MG capsule; Take 2 capsules (600 mg total) by mouth 3 (three) times daily.  Iron deficiency anemia due to chronic blood loss -     Vitamin B12; Future  Phosphorus metabolic disorder -     Phosphorus; Future  Increased vitamin B12 level -     Vitamin B12; Future  Vitamin D deficiency disease -     VITAMIN D 25 Hydroxy (Vit-D Deficiency, Fractures); Future    Follow Up Instructions Return if symptoms worsen or fail to improve.     I discussed the assessment and treatment plan with the patient. The patient was provided an opportunity to ask questions and all were answered. The patient agreed with the plan and demonstrated an understanding of the instructions.   The patient was advised to call back or seek an in-person evaluation if the symptoms worsen or if the condition fails to improve as anticipated.  I provided 10 minutes of non-face-to-face time during this encounter including median intraservice time, reviewing previous notes, labs, imaging, medications and explaining diagnosis and management.  Claiborne Rigg, FNP-BC

## 2021-07-31 ENCOUNTER — Encounter: Payer: Self-pay | Admitting: Physician Assistant

## 2021-07-31 ENCOUNTER — Other Ambulatory Visit: Payer: Self-pay

## 2021-07-31 ENCOUNTER — Ambulatory Visit (INDEPENDENT_AMBULATORY_CARE_PROVIDER_SITE_OTHER): Payer: Self-pay | Admitting: Physician Assistant

## 2021-07-31 ENCOUNTER — Encounter: Payer: Self-pay | Admitting: Oncology

## 2021-07-31 VITALS — BP 109/76 | HR 94 | Temp 98.1°F | Ht 68.0 in | Wt 133.8 lb

## 2021-07-31 DIAGNOSIS — Z09 Encounter for follow-up examination after completed treatment for conditions other than malignant neoplasm: Secondary | ICD-10-CM

## 2021-07-31 DIAGNOSIS — K851 Biliary acute pancreatitis without necrosis or infection: Secondary | ICD-10-CM

## 2021-07-31 NOTE — Progress Notes (Signed)
[facility] POST-OP OFFICE VISIT  07/31/2021  HPI: Sandra Pearson is a 35 y.o. female 15 days s/p robotic assisted laparoscopic cholecystectomy for symptomatic cholelithiasis with Dr Aleen Campi.   She is doing very well. She reports she had significant pain the first day after surgery but that got better quickly. Only needed oxycodone for about 3-4 days. No longer requiring any pain medications.  One episode of diarrhea but otherwise having normal bowel function No fever, chills, nausea, emesis Tolerating PO No issues with incisions  Vital signs: BP 109/76   Pulse 94   Temp 98.1 F (36.7 C) (Oral)   Ht 5\' 8"  (1.727 m)   Wt 133 lb 12.8 oz (60.7 kg)   LMP 07/16/2021   SpO2 98%   BMI 20.34 kg/m    Physical Exam: Constitutional: Well appearing female, NAD Abdomen: Soft, non-tender, non-distended, no rebound/guarding Skin: Laparoscopic incisions are well healed, no erythema or drainage   Assessment/Plan: This is a 35 y.o. female 15 days s/p robotic assisted laparoscopic cholecystectomy for symptomatic cholelithiasis   - Pain control prn  - Reviewed lifting restrictions; 4 weeks total   - Reviewed wound care  - Reviewed surgical pathology: CCC  - She can follow up as needed   -- 34, PA-C [facility] 07/31/2021, 10:42 AM [phone] M-F: 7am - 4pm

## 2021-07-31 NOTE — Addendum Note (Signed)
Addended byMemory Dance on: 07/31/2021 11:55 AM   Modules accepted: Orders

## 2021-07-31 NOTE — Patient Instructions (Addendum)
If you have any concerns or questions, please feel free to call our office.  GENERAL POST-OPERATIVE PATIENT INSTRUCTIONS   WOUND CARE INSTRUCTIONS:  Keep a dry clean dressing on the wound if there is drainage. The initial bandage may be removed after 24 hours.  Once the wound has quit draining you may leave it open to air.  If clothing rubs against the wound or causes irritation and the wound is not draining you may cover it with a dry dressing during the daytime.  Try to keep the wound dry and avoid ointments on the wound unless directed to do so.  If the wound becomes bright red and painful or starts to drain infected material that is not clear, please contact your physician immediately.  If the wound is mildly pink and has a thick firm ridge underneath it, this is normal, and is referred to as a healing ridge.  This will resolve over the next 4-6 weeks.  BATHING: You may shower if you have been informed of this by your surgeon. However, Please do not submerge in a tub, hot tub, or pool until incisions are completely sealed or have been told by your surgeon that you may do so.  DIET:  You may eat any foods that you can tolerate.  It is a good idea to eat a high fiber diet and take in plenty of fluids to prevent constipation.  If you do become constipated you may want to take a mild laxative or take ducolax tablets on a daily basis until your bowel habits are regular.  Constipation can be very uncomfortable, along with straining, after recent surgery.  ACTIVITY:  You are encouraged to cough and deep breath or use your incentive spirometer if you were given one, every 15-30 minutes when awake.  This will help prevent respiratory complications and low grade fevers post-operatively if you had a general anesthetic.  You may want to hug a pillow when coughing and sneezing to add additional support to the surgical area, if you had abdominal or chest surgery, which will decrease pain during these times.  You  are encouraged to walk and engage in light activity for the next two weeks.  You should not lift more than 10-15 pounds, until 08/13/2021 as it could put you at increased risk for complications.  Twenty pounds is roughly equivalent to a plastic bag of groceries. At that time- Listen to your body when lifting, if you have pain when lifting, stop and then try again in a few days. Soreness after doing exercises or activities of daily living is normal as you get back in to your normal routine.  MEDICATIONS:  Try to take narcotic medications and anti-inflammatory medications, such as tylenol, ibuprofen, naprosyn, etc., with food.  This will minimize stomach upset from the medication.  Should you develop nausea and vomiting from the pain medication, or develop a rash, please discontinue the medication and contact your physician.  You should not drive, make important decisions, or operate machinery when taking narcotic pain medication.  SUNBLOCK Use sun block to incision area over the next year if this area will be exposed to sun. This helps decrease scarring and will allow you avoid a permanent darkened area over your incision.    Gallbladder Eating Plan If you have a gallbladder condition, you may have trouble digesting fats. Eating a low-fat diet can help reduce your symptoms, and may be helpful before and after having surgery to remove your gallbladder (cholecystectomy). Your health care  provider may recommend that you work with a diet and nutrition specialist (dietitian) to help you reduce the amount of fat in your diet. What are tips for following this plan? General guidelines Limit your fat intake to less than 30% of your total daily calories. If you eat around 1,800 calories each day, this is less than 60 grams (g) of fat per day. Fat is an important part of a healthy diet. Eating a low-fat diet can make it hard to maintain a healthy body weight. Ask your dietitian how much fat, calories, and other  nutrients you need each day. Eat small, frequent meals throughout the day instead of three large meals. Drink at least 8-10 cups of fluid a day. Drink enough fluid to keep your urine clear or pale yellow. Limit alcohol intake to no more than 1 drink a day for nonpregnant women and 2 drinks a day for men. One drink equals 12 oz of beer, 5 oz of wine, or 1 oz of hard liquor. Reading food labels  Check Nutrition Facts on food labels for the amount of fat per serving. Choose foods with less than 3 grams of fat per serving. Shopping Choose nonfat and low-fat healthy foods. Look for the words "nonfat," "low fat," or "fat free." Avoid buying processed or prepackaged foods. Cooking Cook using low-fat methods, such as baking, broiling, grilling, or boiling. Cook with small amounts of healthy fats, such as olive oil, grapeseed oil, canola oil, or sunflower oil. What foods are recommended? All fresh, frozen, or canned fruits and vegetables. Whole grains. Low-fat or non-fat (skim) milk and yogurt. Lean meat, skinless poultry, fish, eggs, and beans. Low-fat protein supplement powders or drinks. Spices and herbs. What foods are not recommended? High-fat foods. These include baked goods, fast food, fatty cuts of meat, ice cream, french toast, sweet rolls, pizza, cheese bread, foods covered with butter, creamy sauces, or cheese. Fried foods. These include french fries, tempura, battered fish, breaded chicken, fried breads, and sweets. Foods with strong odors. Foods that cause bloating and gas. Summary A low-fat diet can be helpful if you have a gallbladder condition, or before and after gallbladder surgery. Limit your fat intake to less than 30% of your total daily calories. This is about 60 g of fat if you eat 1,800 calories each day. Eat small, frequent meals throughout the day instead of three large meals. This information is not intended to replace advice given to you by your health care provider.  Make sure you discuss any questions you have with your health care provider. Document Revised: 05/31/2020 Document Reviewed: 05/31/2020 Elsevier Patient Education  2022 [employer].

## 2021-08-01 LAB — VITAMIN B12: Vitamin B-12: 1039 pg/mL (ref 232–1245)

## 2021-08-01 LAB — PHOSPHORUS: Phosphorus: 4.1 mg/dL (ref 3.0–4.3)

## 2021-08-01 LAB — VITAMIN D 25 HYDROXY (VIT D DEFICIENCY, FRACTURES): Vit D, 25-Hydroxy: 39 ng/mL (ref 30.0–100.0)

## 2021-08-14 ENCOUNTER — Other Ambulatory Visit: Payer: Self-pay

## 2021-08-14 ENCOUNTER — Encounter: Payer: Self-pay | Admitting: Gastroenterology

## 2021-08-14 ENCOUNTER — Ambulatory Visit (INDEPENDENT_AMBULATORY_CARE_PROVIDER_SITE_OTHER): Payer: Self-pay | Admitting: Gastroenterology

## 2021-08-14 VITALS — BP 123/84 | HR 86 | Temp 98.1°F | Wt 134.0 lb

## 2021-08-14 DIAGNOSIS — Z8619 Personal history of other infectious and parasitic diseases: Secondary | ICD-10-CM

## 2021-08-14 DIAGNOSIS — K8591 Acute pancreatitis with uninfected necrosis, unspecified: Secondary | ICD-10-CM

## 2021-08-14 NOTE — Progress Notes (Addendum)
Melodie Bouillon, MD [address]  [address]  [place], [place] [address]  Main: [phone]  Fax: [phone]   Primary Care Physician: Claiborne Rigg, NP   Chief complaint: Pancreatitis, history of H. pylori  HPI: Sandra Pearson is a 35 y.o. female with history of iron deficiency anemia, recent hospitalization for necrotizing pancreatitis in June 2022 here for follow-up.  Patient denies any further abdominal pain, nausea or vomiting.  Reports good appetite.  No fever or chills.  Reports 1 formed bowel movement daily.  Has recently undergone cholecystectomy in September 2022 and is doing well.  Was evaluated at [facility] by Dr. Nanda Quinton. Note reviewed in care everywhere. They reviewed her CT records and uploaded it to their system. They do not recommend follow up imaging unless pt develops new symptoms.   Most recent labs show a normal CBC, and ferritin.  ROS: All ROS reviewed and negative except as per HPI   Past Medical History:  Diagnosis Date   Acute necrotizing pancreatitis 05/2021   Anemia    was iron deficiency but labs improved   GERD (gastroesophageal reflux disease)    with esophagitis   H. pylori infection 05/2021   stopped medication for this as it may have effected pancreatitis   History of blood transfusion    Hypertension    has never been treated for htn   Hypomagnesemia    Takes iron supplements     Past Surgical History:  Procedure Laterality Date   COLONOSCOPY N/A 03/15/2021   Procedure: COLONOSCOPY;  Surgeon: Pasty Spillers, MD;  Location: [facility] ENDOSCOPY;  Service: Endoscopy;  Laterality: N/A;   ESOPHAGOGASTRODUODENOSCOPY N/A 03/15/2021   Procedure: ESOPHAGOGASTRODUODENOSCOPY (EGD);  Surgeon: Pasty Spillers, MD;  Location: [facility] ENDOSCOPY;  Service: Endoscopy;  Laterality: N/A;   MANDIBLE FRACTURE SURGERY  1999   unsure if any metal. does not think so    Prior to Admission medications   Medication Sig Start Date End  Date Taking? Authorizing Provider  acetaminophen (TYLENOL) 500 MG tablet Take 2 tablets (1,000 mg total) by mouth every 6 (six) hours as needed for mild pain. 07/16/21  Yes Piscoya, Elita Quick, MD  gabapentin (NEURONTIN) 300 MG capsule Take 2 capsules (600 mg total) by mouth 3 (three) times daily. 07/30/21 08/30/21 Yes Claiborne Rigg, NP  ibuprofen (ADVIL) 600 MG tablet Take 1 tablet (600 mg total) by mouth every 8 (eight) hours as needed for moderate pain. 07/16/21  Yes Piscoya, Elita Quick, MD  Multiple Vitamins-Minerals (WOMENS 50+ MULTI VITAMIN/MIN PO) Take 1 tablet by mouth daily.   Yes [provider]  ondansetron (ZOFRAN ODT) 4 MG disintegrating tablet Take 1 tablet (4 mg total) by mouth every 8 (eight) hours as needed for nausea or vomiting. 06/24/21  Yes Irean Hong, MD  thiamine 100 MG tablet Take 1 tablet (100 mg total) by mouth daily. 04/02/21  Yes Hongalgi, Maximino Greenland, MD  vitamin B-12 (CYANOCOBALAMIN) 1000 MCG tablet Take 1 tablet (1,000 mcg total) by mouth daily. 03/15/21  Yes Marrion Coy, MD  ferrous sulfate 325 (65 FE) MG tablet Take 1 tablet (325 mg total) by mouth daily. 03/15/21 07/09/21  Marrion Coy, MD    Family History  Problem Relation Age of Onset   Hepatitis C Mother      Social History   Tobacco Use   Smoking status: Every Day    Packs/day: 0.50    Types: Cigarettes   Smokeless tobacco: Never  Vaping Use  Vaping Use: Some days  Substance Use Topics   Alcohol use: No    Comment: rarely    Drug use: No    Allergies as of 08/14/2021   (No Known Allergies)    Physical Examination:  Constitutional: General:   Alert,  Well-developed, well-nourished, pleasant and cooperative in NAD BP 123/84   Pulse 86   Temp 98.1 F (36.7 C) (Oral)   Wt 134 lb (60.8 kg)   LMP 07/16/2021   BMI 20.37 kg/m   Respiratory: Normal respiratory effort  Gastrointestinal:  Soft, non-tender and non-distended without masses, hepatosplenomegaly or hernias noted.  No guarding or rebound  tenderness.     Cardiac: No clubbing or edema.  No cyanosis. Normal posterior tibial pedal pulses noted.  Psych:  Alert and cooperative. Normal mood and affect.  Musculoskeletal:  Normal gait. Head normocephalic, atraumatic. Symmetrical without gross deformities. 5/5 Lower extremity strength bilaterally.  Skin: Warm. Intact without significant lesions or rashes. No jaundice.  Neck: Supple, trachea midline  Lymph: No cervical lymphadenopathy  Psych:  Alert and oriented x3, Alert and cooperative. Normal mood and affect.  Labs: CMP     Component Value Date/Time   NA 135 06/23/2021 2206   NA 137 05/15/2021 0920   K 4.2 06/23/2021 2206   CL 99 06/23/2021 2206   CO2 28 06/23/2021 2206   GLUCOSE 106 (H) 06/23/2021 2206   BUN 10 06/23/2021 2206   BUN 13 05/15/2021 0920   CREATININE 0.68 06/23/2021 2206   CALCIUM 9.8 06/23/2021 2206   PROT 7.4 06/23/2021 2206   PROT 6.9 05/15/2021 0920   ALBUMIN 4.2 06/23/2021 2206   ALBUMIN 4.4 05/15/2021 0920   AST 32 06/23/2021 2206   ALT 22 06/23/2021 2206   ALKPHOS 82 06/23/2021 2206   BILITOT 0.8 06/23/2021 2206   BILITOT 0.5 05/15/2021 0920   GFRNONAA >60 06/23/2021 2206   GFRAA >60 03/17/2020 2215   Lab Results  Component Value Date   WBC 6.7 07/23/2021   HGB 13.5 07/23/2021   HCT 41.3 07/23/2021   MCV 88.2 07/23/2021   PLT 231 07/23/2021    Imaging Studies:   Assessment and Plan:   Sandra Pearson is a 35 y.o. y/o female here for follow-up of recent necrotizing pancreatitis in June 2022, currently completely asymptomatic and avoiding alcohol and has undergone cholecystectomy recently as well  Patient clinically doing very well with no symptoms of acute on chronic pancreatitis Continue abstinence  Patient was diagnosed with H. pylori gastritis on biopsies.  Triple therapy was prescribed but patient was only able to take about 5 to 7 days of it because she got admitted with pancreatitis.  Clindamycin is a class III  drug listed as far as causing pancreatitis.  Since she is completely asymptomatic, the short course of treatment may have cleared her H. pylori.  I will obtain blood test.  If this is negative, will obtain a second confirmation test such as a stool antigen test.  If either one of them is positive, patient will need treatment for it.  Iron deficiency anemia improved and pt seeing Dr. Orlie Dakin for this. Given recent surgery and improvement in Iron levels, and normal Hemoglobin, no indication for urgent capsule study at this time.   Follow up in 6 months or earlier if symptoms reoccur  Dr Melodie Bouillon

## 2021-08-16 LAB — H. PYLORI BREATH TEST: H pylori Breath Test: NEGATIVE

## 2021-08-20 NOTE — Addendum Note (Signed)
Addended by: Melodie Bouillon on: 08/20/2021 10:36 AM   Modules accepted: Orders

## 2021-08-30 ENCOUNTER — Encounter: Payer: Self-pay | Admitting: Gastroenterology

## 2021-09-03 ENCOUNTER — Encounter: Payer: Self-pay | Admitting: Oncology

## 2021-09-03 ENCOUNTER — Other Ambulatory Visit: Payer: Self-pay

## 2021-09-03 ENCOUNTER — Other Ambulatory Visit: Payer: Self-pay | Admitting: Nurse Practitioner

## 2021-09-03 DIAGNOSIS — D649 Anemia, unspecified: Secondary | ICD-10-CM

## 2021-09-03 NOTE — Telephone Encounter (Signed)
Requested medications are due for refill today.  Unknown  Requested medications are on the active medications list.  no  Last refill. 05/14/2021 #90 with 3 refills.   Future visit scheduled.   yes  Notes to clinic.  Rx expired 08/12/2021.

## 2021-09-04 MED ORDER — FOLIC ACID 1 MG PO TABS
1.0000 mg | ORAL_TABLET | Freq: Every day | ORAL | 3 refills | Status: AC
  Filled 2021-09-04 – 2021-11-25 (×5): qty 30, 30d supply, fill #0

## 2021-09-05 ENCOUNTER — Encounter: Payer: Self-pay | Admitting: Oncology

## 2021-09-05 ENCOUNTER — Other Ambulatory Visit: Payer: Self-pay

## 2021-09-12 ENCOUNTER — Other Ambulatory Visit: Payer: Self-pay

## 2021-09-12 ENCOUNTER — Encounter: Payer: Self-pay | Admitting: Oncology

## 2021-09-16 ENCOUNTER — Other Ambulatory Visit: Payer: Self-pay

## 2021-09-16 ENCOUNTER — Encounter: Payer: Self-pay | Admitting: Oncology

## 2021-09-16 ENCOUNTER — Emergency Department
Admission: EM | Admit: 2021-09-16 | Discharge: 2021-09-16 | Disposition: A | Discharge status: Home / Self Care | Payer: Self-pay | Attending: Emergency Medicine | Admitting: Emergency Medicine

## 2021-09-16 ENCOUNTER — Emergency Department: Payer: Self-pay

## 2021-09-16 DIAGNOSIS — K2920 Alcoholic gastritis without bleeding: Secondary | ICD-10-CM

## 2021-09-16 DIAGNOSIS — I10 Essential (primary) hypertension: Secondary | ICD-10-CM | POA: Insufficient documentation

## 2021-09-16 DIAGNOSIS — F17210 Nicotine dependence, cigarettes, uncomplicated: Secondary | ICD-10-CM | POA: Insufficient documentation

## 2021-09-16 DIAGNOSIS — R1013 Epigastric pain: Secondary | ICD-10-CM

## 2021-09-16 LAB — BASIC METABOLIC PANEL
Anion gap: 9 (ref 5–15)
BUN: 10 mg/dL (ref 6–20)
CO2: 26 mmol/L (ref 22–32)
Calcium: 9.2 mg/dL (ref 8.9–10.3)
Chloride: 101 mmol/L (ref 98–111)
Creatinine, Ser: 0.57 mg/dL (ref 0.44–1.00)
GFR, Estimated: >60 mL/min (ref >60)
Glucose, Bld: 113 mg/dL — ABNORMAL HIGH (ref 70–99)
Potassium: 4.3 mmol/L (ref 3.5–5.1)
Sodium: 136 mmol/L (ref 135–145)

## 2021-09-16 LAB — HEPATIC FUNCTION PANEL
ALT: 18 U/L (ref 0–44)
AST: 30 U/L (ref 15–41)
Albumin: 4.4 g/dL (ref 3.5–5.0)
Alkaline Phosphatase: 81 U/L (ref 38–126)
Bilirubin, Direct: 0.2 mg/dL (ref 0.0–0.2)
Indirect Bilirubin: 0.7 mg/dL (ref 0.3–0.9)
Total Bilirubin: 0.9 mg/dL (ref 0.3–1.2)
Total Protein: 7.9 g/dL (ref 6.5–8.1)

## 2021-09-16 LAB — CBC
HCT: 45.1 % (ref 36.0–46.0)
Hemoglobin: 14.8 g/dL (ref 12.0–15.0)
MCH: 30 pg (ref 26.0–34.0)
MCHC: 32.8 g/dL (ref 30.0–36.0)
MCV: 91.5 fL (ref 80.0–100.0)
Platelets: 304 10*3/uL (ref 150–400)
RBC: 4.93 MIL/uL (ref 3.87–5.11)
RDW: 16 % — ABNORMAL HIGH (ref 11.5–15.5)
WBC: 7.9 10*3/uL (ref 4.0–10.5)
nRBC: 0 % (ref 0.0–0.2)

## 2021-09-16 LAB — TROPONIN I (HIGH SENSITIVITY): Troponin I (High Sensitivity): 3 ng/L (ref <18)

## 2021-09-16 LAB — LIPASE, BLOOD: Lipase: 57 U/L — ABNORMAL HIGH (ref 11–51)

## 2021-09-16 MED ORDER — ALUMINUM-MAGNESIUM-SIMETHICONE 200-200-20 MG/5ML PO SUSP: 30.0000 mL | Freq: Three times a day (TID) | ORAL | 0 refills | Status: DC

## 2021-09-16 MED ORDER — ONDANSETRON 4 MG PO TBDP
4.0000 mg | ORAL_TABLET | Freq: Three times a day (TID) | ORAL | 0 refills | Status: DC | PRN
  Filled 2021-09-16: qty 20, 7d supply, fill #0

## 2021-09-16 MED ORDER — PANTOPRAZOLE SODIUM 40 MG IV SOLR
40.0000 mg | Freq: Once | INTRAVENOUS | Status: AC
  Administered 2021-09-16: 40 mg via INTRAVENOUS
  Filled 2021-09-16: qty 40

## 2021-09-16 MED ORDER — ONDANSETRON HCL 4 MG/2ML IJ SOLN
4.0000 mg | Freq: Once | INTRAMUSCULAR | Status: AC
  Administered 2021-09-16: 4 mg via INTRAVENOUS
  Filled 2021-09-16: qty 2

## 2021-09-16 MED ORDER — SODIUM CHLORIDE 0.9 % IV BOLUS
500.0000 mL | Freq: Once | INTRAVENOUS | Status: AC
  Administered 2021-09-16: 500 mL via INTRAVENOUS

## 2021-09-16 MED ORDER — FAMOTIDINE 20 MG PO TABS
20.0000 mg | ORAL_TABLET | Freq: Two times a day (BID) | ORAL | 0 refills | Status: DC
  Filled 2021-09-16: qty 60, 30d supply, fill #0

## 2021-09-16 MED ORDER — ALUM & MAG HYDROXIDE-SIMETH 200-200-20 MG/5ML PO SUSP
30.0000 mL | Freq: Once | ORAL | Status: AC
  Administered 2021-09-16: 30 mL via ORAL
  Filled 2021-09-16: qty 30

## 2021-09-16 NOTE — ED Provider Notes (Signed)
[facility] Emergency Department Provider Note  ____________________________________________  Time seen: Approximately 12:41 PM  I have reviewed the triage vital signs and the nursing notes.   HISTORY  Chief Complaint Abdominal Pain    HPI [place] Clover Mealy is a 35 y.o. female with a history of H. pylori infection, GERD, hypertension, pancreatitis who comes ED complaining of upper abdominal pain that started this morning, associated with nausea.  Nonradiating.  Feels like a dull ache, 5/10 in intensity.  No aggravating or alleviating factors.  Feels somewhat like pancreatitis that she had in the past but not nearly as severe.  Does note that she had 4 alcohol drinks last night.    Past Medical History:  Diagnosis Date   Acute necrotizing pancreatitis 05/2021   Anemia    was iron deficiency but labs improved   GERD (gastroesophageal reflux disease)    with esophagitis   H. pylori infection 05/2021   stopped medication for this as it may have effected pancreatitis   History of blood transfusion    Hypertension    has never been treated for htn   Hypomagnesemia    Takes iron supplements      Patient Active Problem List   Diagnosis Date Noted   Gallstone pancreatitis    Acute pancreatitis 03/29/2021   Normocytic anemia 03/29/2021   GERD with esophagitis 03/29/2021   H. pylori infection 03/29/2021   Chest pain 03/29/2021   Severe acute pancreatitis 03/29/2021   Hypertension    Atypical chest pain    Iron deficiency anemia    Gastric erythema    Hiatal hernia    Acute esophagitis    Acute anemia 03/13/2021   Severe anemia 03/12/2021   Hypokalemia 03/12/2021   Hypomagnesemia 03/12/2021   Complex ovarian cyst 11/22/2018   Nausea and vomiting in adult 11/22/2018     Past Surgical History:  Procedure Laterality Date   COLONOSCOPY N/A 03/15/2021   Procedure: COLONOSCOPY;  Surgeon: Pasty Spillers, MD;  Location: [facility] ENDOSCOPY;   Service: Endoscopy;  Laterality: N/A;   ESOPHAGOGASTRODUODENOSCOPY N/A 03/15/2021   Procedure: ESOPHAGOGASTRODUODENOSCOPY (EGD);  Surgeon: Pasty Spillers, MD;  Location: [facility] ENDOSCOPY;  Service: Endoscopy;  Laterality: N/A;   MANDIBLE FRACTURE SURGERY  1999   unsure if any metal. does not think so     Prior to Admission medications   Medication Sig Start Date End Date Taking? Authorizing Provider  aluminum-magnesium hydroxide-simethicone (MAALOX) 200-200-20 MG/5ML SUSP Take 30 mLs by mouth 4 (four) times daily -  before meals and at bedtime. 09/16/21  Yes Sharman Cheek, MD  famotidine (PEPCID) 20 MG tablet Take 1 tablet (20 mg total) by mouth 2 (two) times daily. 09/16/21  Yes Sharman Cheek, MD  ondansetron (ZOFRAN ODT) 4 MG disintegrating tablet Take 1 tablet (4 mg total) by mouth every 8 (eight) hours as needed for nausea or vomiting. 09/16/21  Yes Sharman Cheek, MD  acetaminophen (TYLENOL) 500 MG tablet Take 2 tablets (1,000 mg total) by mouth every 6 (six) hours as needed for mild pain. 07/16/21   Henrene Dodge, MD  ferrous sulfate 325 (65 FE) MG tablet Take 1 tablet (325 mg total) by mouth daily. 03/15/21 07/09/21  Marrion Coy, MD  folic acid (FOLVITE) 1 MG tablet Take 1 tablet (1 mg total) by mouth daily. 09/04/21 12/03/21  Claiborne Rigg, NP  gabapentin (NEURONTIN) 300 MG capsule Take 2 capsules (600 mg total) by mouth 3 (three) times daily. 07/30/21 10/03/21  Claiborne Rigg, NP  ibuprofen (ADVIL) 600 MG tablet Take 1 tablet (600 mg total) by mouth every 8 (eight) hours as needed for moderate pain. 07/16/21   Henrene Dodge, MD  Multiple Vitamins-Minerals (WOMENS 50+ MULTI VITAMIN/MIN PO) Take 1 tablet by mouth daily.    [provider]  thiamine 100 MG tablet Take 1 tablet (100 mg total) by mouth daily. 04/02/21   Hongalgi, Maximino Greenland, MD  vitamin B-12 (CYANOCOBALAMIN) 1000 MCG tablet Take 1 tablet (1,000 mcg total) by mouth daily. 03/15/21   Marrion Coy, MD      Allergies Patient has no known allergies.   Family History  Problem Relation Age of Onset   Hepatitis C Mother     Social History Social History   Tobacco Use   Smoking status: Every Day    Packs/day: 0.50    Types: Cigarettes   Smokeless tobacco: Never  Vaping Use   Vaping Use: Some days  Substance Use Topics   Alcohol use: No    Comment: rarely    Drug use: No    Review of Systems  Constitutional:   No fever or chills.  ENT:   No sore throat. No rhinorrhea. Cardiovascular:   No chest pain or syncope. Respiratory:   No dyspnea or cough. Gastrointestinal:   Positive as above for upper abdominal pain without vomiting and diarrhea.  Musculoskeletal:   Negative for focal pain or swelling All other systems reviewed and are negative except as documented above in ROS and HPI.  ____________________________________________   PHYSICAL EXAM:  VITAL SIGNS: ED Triage Vitals  Enc Vitals Group     BP 09/16/21 0948 130/88     Pulse Rate 09/16/21 0948 84     Resp 09/16/21 0948 20     Temp 09/16/21 0948 97.8 F (36.6 C)     Temp Source 09/16/21 0948 Oral     SpO2 09/16/21 0948 96 %     Weight 09/16/21 0949 135 lb (61.2 kg)     Height 09/16/21 0949 5\' 8"  (1.727 m)     Head Circumference --      Peak Flow --      Pain Score --      Pain Loc --      Pain Edu? --      Excl. in GC? --     Vital signs reviewed, nursing assessments reviewed.   Constitutional:   Alert and oriented. Non-toxic appearance. Eyes:   Conjunctivae are normal. EOMI. PERRL. ENT      Head:   Normocephalic and atraumatic.      Nose:   Wearing a mask.      Mouth/Throat:   Wearing a mask.      Neck:   No meningismus. Full ROM. Hematological/Lymphatic/Immunilogical:   No cervical lymphadenopathy. Cardiovascular:   RRR. Symmetric bilateral radial and DP pulses.  No murmurs. Cap refill less than 2 seconds. Respiratory:   Normal respiratory effort without tachypnea/retractions. Breath sounds are  clear and equal bilaterally. No wheezes/rales/rhonchi. Gastrointestinal:   Soft and nontender. Non distended. There is no CVA tenderness.  No rebound, rigidity, or guarding. Genitourinary:   deferred Musculoskeletal:   Normal range of motion in all extremities. No joint effusions.  No lower extremity tenderness.  No edema. Neurologic:   Normal speech and language.  Motor grossly intact. No acute focal neurologic deficits are appreciated.  Skin:    Skin is warm, dry and intact. No rash noted.  No petechiae, purpura, or bullae.  ____________________________________________  LABS (pertinent positives/negatives) (all labs ordered are listed, but only abnormal results are displayed) Labs Reviewed  BASIC METABOLIC PANEL - Abnormal; Notable for the following components:      Result Value   Glucose, Bld 113 (*)    All other components within normal limits  CBC - Abnormal; Notable for the following components:   RDW 16.0 (*)    All other components within normal limits  LIPASE, BLOOD - Abnormal; Notable for the following components:   Lipase 57 (*)    All other components within normal limits  HEPATIC FUNCTION PANEL  POC URINE PREG, ED  TROPONIN I (HIGH SENSITIVITY)  TROPONIN I (HIGH SENSITIVITY)   ____________________________________________   EKG    ____________________________________________    RADIOLOGY  DG Chest 2 View  Result Date: 09/16/2021 CLINICAL DATA:  Chest pain. EXAM: CHEST - 2 VIEW COMPARISON:  March 29, 2021. FINDINGS: The heart size and mediastinal contours are within normal limits. Both lungs are clear. No visible pleural effusions or pneumothorax. Biapical pleuroparenchymal scarring. No acute osseous abnormality. Mild reverse S-shaped thoracolumbar curvature. IMPRESSION: No evidence of acute cardiopulmonary disease. Electronically Signed   By: Feliberto Harts M.D.   On: 09/16/2021 10:19     ____________________________________________   PROCEDURES Procedures  ____________________________________________    CLINICAL IMPRESSION / ASSESSMENT AND PLAN / ED COURSE  Medications ordered in the ED: Medications  ondansetron (ZOFRAN) injection 4 mg (4 mg Intravenous Given 09/16/21 1131)  alum & mag hydroxide-simeth (MAALOX/MYLANTA) 200-200-20 MG/5ML suspension 30 mL (30 mLs Oral Given 09/16/21 1131)  pantoprazole (PROTONIX) injection 40 mg (40 mg Intravenous Given 09/16/21 1132)  sodium chloride 0.9 % bolus 500 mL (0 mLs Intravenous Stopped 09/16/21 1227)    Pertinent labs & imaging results that were available during my care of the patient were reviewed by me and considered in my medical decision making (see chart for details).  [name] was evaluated in Emergency Department on 09/16/2021 for the symptoms described in the history of present illness. She was evaluated in the context of the global COVID-19 pandemic, which necessitated consideration that the patient might be at risk for infection with the SARS-CoV-2 virus that causes COVID-19. Institutional protocols and algorithms that pertain to the evaluation of patients at risk for COVID-19 are in a state of rapid change based on information released by regulatory bodies including the CDC and federal and state organizations. These policies and algorithms were followed during the patient's care in the ED.   Patient presents with upper abdominal pain, normal vitals, reassuring exam.  Labs are all normal.  Reviewing EMR, she had a CT scan in June 2022 at a time when she had acute pancreatitis on CT.  Lipase correlated with a level of 390.  Today's level is 57.  Patient given Maalox, Protonix, Zofran, feeling much better, sitting upright and eating and drinking, stable for discharge.   Considering the patient's symptoms, medical history, and physical examination today, I have low suspicion for cholecystitis or  biliary pathology, pancreatitis, perforation or bowel obstruction, hernia, intra-abdominal abscess, AAA or dissection, volvulus or intussusception, mesenteric ischemia, or appendicitis.       ____________________________________________   FINAL CLINICAL IMPRESSION(S) / ED DIAGNOSES    Final diagnoses:  Epigastric pain  Acute alcoholic gastritis without hemorrhage     ED Discharge Orders          Ordered    famotidine (PEPCID) 20 MG tablet  2 times daily        09/16/21  1240    ondansetron (ZOFRAN ODT) 4 MG disintegrating tablet  Every 8 hours PRN        09/16/21 1240    aluminum-magnesium hydroxide-simethicone (MAALOX) 200-200-20 MG/5ML SUSP  3 times daily before meals & bedtime        09/16/21 1240            Portions of this note were generated with dragon dictation software. Dictation errors may occur despite best attempts at proofreading.    Sharman Cheek, MD 09/16/21 1243

## 2021-09-16 NOTE — ED Triage Notes (Signed)
Pt to ED for epigastric pain that started this am. +nausea Hx pancreatitis, states feels similar

## 2021-09-18 ENCOUNTER — Other Ambulatory Visit: Payer: Self-pay

## 2021-09-18 ENCOUNTER — Ambulatory Visit: Payer: Self-pay | Attending: Nurse Practitioner | Admitting: Nurse Practitioner

## 2021-09-18 ENCOUNTER — Encounter: Payer: Self-pay | Admitting: Nurse Practitioner

## 2021-09-18 ENCOUNTER — Encounter: Payer: Self-pay | Admitting: Oncology

## 2021-09-18 VITALS — BP 136/90 | HR 92 | Ht 68.0 in | Wt 136.2 lb

## 2021-09-18 DIAGNOSIS — M5431 Sciatica, right side: Secondary | ICD-10-CM

## 2021-09-18 DIAGNOSIS — Z09 Encounter for follow-up examination after completed treatment for conditions other than malignant neoplasm: Secondary | ICD-10-CM

## 2021-09-18 DIAGNOSIS — Z23 Encounter for immunization: Secondary | ICD-10-CM

## 2021-09-18 DIAGNOSIS — R748 Abnormal levels of other serum enzymes: Secondary | ICD-10-CM

## 2021-09-18 DIAGNOSIS — K2100 Gastro-esophageal reflux disease with esophagitis, without bleeding: Secondary | ICD-10-CM

## 2021-09-18 MED ORDER — OMEPRAZOLE 40 MG PO CPDR
40.0000 mg | DELAYED_RELEASE_CAPSULE | Freq: Every day | ORAL | 3 refills | Status: DC
  Filled 2021-09-18: qty 30, 30d supply, fill #0
  Filled 2021-11-23: qty 30, 30d supply, fill #1
  Filled 2021-11-25: qty 30, 30d supply, fill #0
  Filled 2022-01-08: qty 30, 30d supply, fill #1

## 2021-09-18 NOTE — Progress Notes (Signed)
Assessment & Plan:  [place] was seen today for hospitalization follow-up.  Diagnoses and all orders for this visit:  Hospital discharge follow-up  Gastroesophageal reflux disease with esophagitis without hemorrhage -     omeprazole (PRILOSEC) 40 MG capsule; Take 1 capsule (40 mg total) by mouth daily. INSTRUCTIONS: Avoid GERD Triggers: acidic, spicy or fried foods, caffeine, coffee, sodas,  alcohol and chocolate.    Sciatica of right side without back pain -     DG SCOLIOSIS EVAL COMPLETE SPINE 2 OR 3 VIEWS; Future  Increased vitamin B12 level -     Vitamin B12  Need for immunization against influenza -     Flu Vaccine QUAD 43mo+IM (Fluarix, Fluzone & Alfiuria Quad PF)   Patient has been counseled on age-appropriate routine health concerns for screening and prevention. These are reviewed and up-to-date. Referrals have been placed accordingly. Immunizations are up-to-date or declined.    Subjective:   Chief Complaint  Patient presents with   Hospitalization Follow-up   HPI [name] 35 y.o. female presents to office today for HFU. She has a history of H. pylori infection, GERD, hypertension, pancreatitis.  Polyneuropathy  HFU Evaluated on 09/16/2021 for upper abdominal pain and nausea.  Chest x-ray negative for acute cardiopulmonary disease.  She was given Maalox, Protonix, Zofran and discharged home in stable condition. Today she endorses persistent nausea and dry mouth.  We will stop famotidine and try her on omeprazole.   Musculoskeletal She endorses right sided pain.  Pain is somewhat relieved with position changes.  She does have scoliosis which was seen on a previous x-ray.  Review of Systems  Constitutional:  Negative for fever, malaise/fatigue and weight loss.  HENT: Negative.  Negative for nosebleeds.   Eyes: Negative.  Negative for blurred vision, double vision and photophobia.  Respiratory: Negative.  Negative for cough and shortness of breath.    Cardiovascular: Negative.  Negative for chest pain, palpitations and leg swelling.  Gastrointestinal:  Positive for heartburn and nausea. Negative for abdominal pain, blood in stool, constipation, diarrhea, melena and vomiting.  Genitourinary: Negative.   Musculoskeletal:  Positive for myalgias.  Neurological: Negative.  Negative for dizziness, focal weakness, seizures and headaches.  Psychiatric/Behavioral: Negative.  Negative for suicidal ideas.    Past Medical History:  Diagnosis Date   Acute necrotizing pancreatitis 05/2021   Anemia    was iron deficiency but labs improved   GERD (gastroesophageal reflux disease)    with esophagitis   H. pylori infection 05/2021   stopped medication for this as it may have effected pancreatitis   History of blood transfusion    Hypertension    has never been treated for htn   Hypomagnesemia    Takes iron supplements     Past Surgical History:  Procedure Laterality Date   COLONOSCOPY N/A 03/15/2021   Procedure: COLONOSCOPY;  Surgeon: Pasty Spillers, MD;  Location: [facility] ENDOSCOPY;  Service: Endoscopy;  Laterality: N/A;   ESOPHAGOGASTRODUODENOSCOPY N/A 03/15/2021   Procedure: ESOPHAGOGASTRODUODENOSCOPY (EGD);  Surgeon: Pasty Spillers, MD;  Location: [facility] ENDOSCOPY;  Service: Endoscopy;  Laterality: N/A;   MANDIBLE FRACTURE SURGERY  1999   unsure if any metal. does not think so    Family History  Problem Relation Age of Onset   Hepatitis C Mother     Social History Reviewed with no changes to be made today.   Outpatient Medications Prior to Visit  Medication Sig Dispense Refill   acetaminophen (TYLENOL) 500 MG tablet Take 2  tablets (1,000 mg total) by mouth every 6 (six) hours as needed for mild pain.     aluminum-magnesium hydroxide-simethicone (MAALOX) 200-200-20 MG/5ML SUSP Take 30 mLs by mouth 4 (four) times daily -  before meals and at bedtime. 355 mL 0   folic acid (FOLVITE) 1 MG tablet Take 1 tablet (1 mg total) by  mouth daily. 90 tablet 3   gabapentin (NEURONTIN) 300 MG capsule Take 2 capsules (600 mg total) by mouth 3 (three) times daily. 180 capsule 3   Multiple Vitamins-Minerals (WOMENS 50+ MULTI VITAMIN/MIN PO) Take 1 tablet by mouth daily.     ondansetron (ZOFRAN ODT) 4 MG disintegrating tablet Take 1 tablet (4 mg total) by mouth every 8 (eight) hours as needed for nausea or vomiting. 20 tablet 0   thiamine 100 MG tablet Take 1 tablet (100 mg total) by mouth daily. 30 tablet 0   vitamin B-12 (CYANOCOBALAMIN) 1000 MCG tablet Take 1 tablet (1,000 mcg total) by mouth daily. 30 tablet 0   famotidine (PEPCID) 20 MG tablet Take 1 tablet (20 mg total) by mouth 2 (two) times daily. 60 tablet 0   ferrous sulfate 325 (65 FE) MG tablet Take 1 tablet (325 mg total) by mouth daily. 30 tablet 0   ibuprofen (ADVIL) 600 MG tablet Take 1 tablet (600 mg total) by mouth every 8 (eight) hours as needed for moderate pain. (Patient not taking: Reported on 09/18/2021) 60 tablet 1   No facility-administered medications prior to visit.    No Known Allergies     Objective:    BP 136/90   Pulse 92   Ht 5\' 8"  (1.727 m)   Wt 136 lb 4 oz (61.8 kg)   LMP 09/12/2021 (Exact Date)   SpO2 98%   BMI 20.72 kg/m  Wt Readings from Last 3 Encounters:  09/18/21 136 lb 4 oz (61.8 kg)  09/16/21 135 lb (61.2 kg)  08/14/21 134 lb (60.8 kg)    Physical Exam Vitals and nursing note reviewed.  Constitutional:      Appearance: She is well-developed.  HENT:     Head: Normocephalic and atraumatic.  Cardiovascular:     Rate and Rhythm: Normal rate and regular rhythm.     Heart sounds: Normal heart sounds. No murmur heard.   No friction rub. No gallop.  Pulmonary:     Effort: Pulmonary effort is normal. No tachypnea or respiratory distress.     Breath sounds: Normal breath sounds. No decreased breath sounds, wheezing, rhonchi or rales.  Chest:     Chest wall: No tenderness.  Abdominal:     General: Bowel sounds are normal.      Palpations: Abdomen is soft.  Musculoskeletal:        General: Normal range of motion.     Cervical back: Normal range of motion.  Skin:    General: Skin is warm and dry.  Neurological:     Mental Status: She is alert and oriented to person, place, and time.     Coordination: Coordination normal.  Psychiatric:        Behavior: Behavior normal. Behavior is cooperative.        Thought Content: Thought content normal.        Judgment: Judgment normal.         Patient has been counseled extensively about nutrition and exercise as well as the importance of adherence with medications and regular follow-up. The patient was given clear instructions to go to ER or return to  medical center if symptoms don't improve, worsen or new problems develop. The patient verbalized understanding.   Follow-up: Return if symptoms worsen or fail to improve.   Claiborne Rigg, FNP-BC [facility] [place], [place] [phone]   09/18/2021, 12:31 PM

## 2021-09-19 LAB — VITAMIN B12: Vitamin B-12: 735 pg/mL (ref 232–1245)

## 2021-09-23 ENCOUNTER — Other Ambulatory Visit: Payer: Self-pay

## 2021-09-23 ENCOUNTER — Ambulatory Visit: Payer: Self-pay | Attending: Nurse Practitioner

## 2021-09-23 DIAGNOSIS — K8020 Calculus of gallbladder without cholecystitis without obstruction: Secondary | ICD-10-CM | POA: Insufficient documentation

## 2021-09-23 DIAGNOSIS — Z23 Encounter for immunization: Secondary | ICD-10-CM

## 2021-09-23 NOTE — Progress Notes (Signed)
Pt received PCV-20 vaccine. Pt was given vaccine in left deltoid Pt tolerated injection well.

## 2021-09-28 LAB — H. PYLORI ANTIGEN, STOOL: H pylori Ag, Stl: NEGATIVE

## 2021-10-03 ENCOUNTER — Other Ambulatory Visit: Payer: Self-pay

## 2021-10-03 ENCOUNTER — Other Ambulatory Visit: Payer: Self-pay | Admitting: Emergency Medicine

## 2021-10-04 ENCOUNTER — Encounter: Payer: Self-pay | Admitting: Oncology

## 2021-10-04 ENCOUNTER — Emergency Department
Admission: EM | Admit: 2021-10-04 | Discharge: 2021-10-04 | Disposition: A | Discharge status: Home / Self Care | Payer: Self-pay | Attending: Emergency Medicine | Admitting: Emergency Medicine

## 2021-10-04 ENCOUNTER — Other Ambulatory Visit: Payer: Self-pay

## 2021-10-04 ENCOUNTER — Encounter: Payer: Self-pay | Admitting: Emergency Medicine

## 2021-10-04 ENCOUNTER — Emergency Department: Payer: Self-pay

## 2021-10-04 DIAGNOSIS — K2900 Acute gastritis without bleeding: Secondary | ICD-10-CM | POA: Insufficient documentation

## 2021-10-04 DIAGNOSIS — F17210 Nicotine dependence, cigarettes, uncomplicated: Secondary | ICD-10-CM | POA: Insufficient documentation

## 2021-10-04 DIAGNOSIS — R1011 Right upper quadrant pain: Secondary | ICD-10-CM

## 2021-10-04 DIAGNOSIS — R110 Nausea: Secondary | ICD-10-CM

## 2021-10-04 DIAGNOSIS — I10 Essential (primary) hypertension: Secondary | ICD-10-CM | POA: Insufficient documentation

## 2021-10-04 LAB — COMPREHENSIVE METABOLIC PANEL
ALT: 14 U/L (ref 0–44)
AST: 20 U/L (ref 15–41)
Albumin: 4.4 g/dL (ref 3.5–5.0)
Alkaline Phosphatase: 81 U/L (ref 38–126)
Anion gap: 13 (ref 5–15)
BUN: 11 mg/dL (ref 6–20)
CO2: 20 mmol/L — ABNORMAL LOW (ref 22–32)
Calcium: 9.4 mg/dL (ref 8.9–10.3)
Chloride: 98 mmol/L (ref 98–111)
Creatinine, Ser: 0.59 mg/dL (ref 0.44–1.00)
GFR, Estimated: >60 mL/min (ref >60)
Glucose, Bld: 108 mg/dL — ABNORMAL HIGH (ref 70–99)
Potassium: 4.1 mmol/L (ref 3.5–5.1)
Sodium: 131 mmol/L — ABNORMAL LOW (ref 135–145)
Total Bilirubin: 1.7 mg/dL — ABNORMAL HIGH (ref 0.3–1.2)
Total Protein: 8.3 g/dL — ABNORMAL HIGH (ref 6.5–8.1)

## 2021-10-04 LAB — CBC
HCT: 46.3 % — ABNORMAL HIGH (ref 36.0–46.0)
Hemoglobin: 15.2 g/dL — ABNORMAL HIGH (ref 12.0–15.0)
MCH: 30 pg (ref 26.0–34.0)
MCHC: 32.8 g/dL (ref 30.0–36.0)
MCV: 91.5 fL (ref 80.0–100.0)
Platelets: 215 10*3/uL (ref 150–400)
RBC: 5.06 MIL/uL (ref 3.87–5.11)
RDW: 15.4 % (ref 11.5–15.5)
WBC: 11.5 10*3/uL — ABNORMAL HIGH (ref 4.0–10.5)
nRBC: 0 % (ref 0.0–0.2)

## 2021-10-04 LAB — URINALYSIS, ROUTINE W REFLEX MICROSCOPIC
Glucose, UA: NEGATIVE mg/dL
Ketones, ur: >160 mg/dL — AB
Leukocytes,Ua: NEGATIVE
Nitrite: NEGATIVE
Protein, ur: 30 mg/dL — AB
Specific Gravity, Urine: >1.03 — ABNORMAL HIGH (ref 1.005–1.030)
pH: 5.5 (ref 5.0–8.0)

## 2021-10-04 LAB — URINALYSIS, MICROSCOPIC (REFLEX): Squamous Epithelial / HPF: >50 (ref 0–5)

## 2021-10-04 LAB — LIPASE, BLOOD: Lipase: 34 U/L (ref 11–51)

## 2021-10-04 LAB — POC URINE PREG, ED: Preg Test, Ur: NEGATIVE

## 2021-10-04 MED ORDER — FAMOTIDINE 40 MG PO TABS
40.0000 mg | ORAL_TABLET | Freq: Every evening | ORAL | 0 refills | Status: DC
  Filled 2021-10-04: qty 30, 30d supply, fill #0

## 2021-10-04 MED ORDER — SUCRALFATE 1 G PO TABS
1.0000 g | ORAL_TABLET | Freq: Three times a day (TID) | ORAL | 0 refills | Status: DC
  Filled 2021-10-04: qty 120, 30d supply, fill #0

## 2021-10-04 MED ORDER — SODIUM CHLORIDE 0.9 % IV BOLUS
1000.0000 mL | Freq: Once | INTRAVENOUS | Status: AC
  Administered 2021-10-04: 1000 mL via INTRAVENOUS

## 2021-10-04 MED ORDER — ONDANSETRON HCL 4 MG PO TABS
4.0000 mg | ORAL_TABLET | Freq: Every day | ORAL | 1 refills | Status: DC | PRN
  Filled 2021-10-04: qty 30, 30d supply, fill #0

## 2021-10-04 NOTE — ED Notes (Signed)
This RN to bedside to update pt on POC

## 2021-10-04 NOTE — ED Triage Notes (Signed)
Pt comes into the ED via POV c/o generalized abd pain and feeling dehydrated.  Pt states that prior to thanksgiving she was dx with gastritis and she feels as though she is having another flare up of it.  Pt in NAD at this time.

## 2021-10-04 NOTE — ED Provider Notes (Signed)
[facility] Emergency Department Provider Note   ____________________________________________   Event Date/Time   First MD Initiated Contact with Patient 10/04/21 [phone]     (approximate)  I have reviewed the triage vital signs and the nursing notes.   HISTORY  Chief Complaint Abdominal Pain    HPI Sandra Pearson is a 35 y.o. female who presents for midepigastric abdominal pain  LOCATION: Generalized abdomen with the worst pain in the midepigastric region DURATION: 3 days prior to arrival TIMING: Worsening since onset SEVERITY: Severe QUALITY: Burning pain CONTEXT: Patient states 3 days prior to arrival she began having a burning epigastric pain that is now spread into her generalized abdomen and is similar to previous incidents that she has had gastritis in the past just prior to Thanksgiving MODIFYING FACTORS: Any p.o. intake worsens this pain and its partially relieved at rest and with her home omeprazole ASSOCIATED SYMPTOMS: Nausea/vomiting   Per medical record review, patient has history of recurrent pancreatitis and gastritis as well as H. pylori in the past          Past Medical History:  Diagnosis Date   Acute necrotizing pancreatitis 05/2021   Anemia    was iron deficiency but labs improved   GERD (gastroesophageal reflux disease)    with esophagitis   H. pylori infection 05/2021   stopped medication for this as it may have effected pancreatitis   History of blood transfusion    Hypertension    has never been treated for htn   Hypomagnesemia    Takes iron supplements     Patient Active Problem List   Diagnosis Date Noted   Gallstones 09/23/2021   Gallstone pancreatitis    Acute pancreatitis 03/29/2021   Normocytic anemia 03/29/2021   GERD with esophagitis 03/29/2021   H. pylori infection 03/29/2021   Chest pain 03/29/2021   Severe acute pancreatitis 03/29/2021   Hypertension    Atypical chest pain    Iron  deficiency anemia    Gastric erythema    Hiatal hernia    Acute esophagitis    Acute anemia 03/13/2021   Severe anemia 03/12/2021   Hypokalemia 03/12/2021   Hypomagnesemia 03/12/2021   Complex ovarian cyst 11/22/2018   Nausea and vomiting in adult 11/22/2018    Past Surgical History:  Procedure Laterality Date   COLONOSCOPY N/A 03/15/2021   Procedure: COLONOSCOPY;  Surgeon: Pasty Spillers, MD;  Location: [facility] ENDOSCOPY;  Service: Endoscopy;  Laterality: N/A;   ESOPHAGOGASTRODUODENOSCOPY N/A 03/15/2021   Procedure: ESOPHAGOGASTRODUODENOSCOPY (EGD);  Surgeon: Pasty Spillers, MD;  Location: [facility] ENDOSCOPY;  Service: Endoscopy;  Laterality: N/A;   MANDIBLE FRACTURE SURGERY  1999   unsure if any metal. does not think so    Prior to Admission medications   Medication Sig Start Date End Date Taking? Authorizing Provider  famotidine (PEPCID) 40 MG tablet Take 1 tablet (40 mg total) by mouth every evening. 10/04/21 11/03/21 Yes Skyleigh Windle, Clent Jacks, MD  ondansetron (ZOFRAN) 4 MG tablet Take 1 tablet (4 mg total) by mouth daily as needed for nausea or vomiting. 10/04/21 10/04/22 Yes Kobe Ofallon, Clent Jacks, MD  sucralfate (CARAFATE) 1 g tablet Take 1 tablet (1 g total) by mouth 4 (four) times daily -  with meals and at bedtime. 10/04/21 11/03/21 Yes Merwyn Katos, MD  acetaminophen (TYLENOL) 500 MG tablet Take 2 tablets (1,000 mg total) by mouth every 6 (six) hours as needed for mild pain. 07/16/21   Henrene Dodge, MD  aluminum-magnesium  hydroxide-simethicone (MAALOX) 200-200-20 MG/5ML SUSP Take 30 mLs by mouth 4 (four) times daily -  before meals and at bedtime. 09/16/21   Sharman Cheek, MD  ferrous sulfate 325 (65 FE) MG tablet Take 1 tablet (325 mg total) by mouth daily. 03/15/21 07/09/21  Marrion Coy, MD  folic acid (FOLVITE) 1 MG tablet Take 1 tablet (1 mg total) by mouth daily. 09/04/21 12/03/21  Claiborne Rigg, NP  gabapentin (NEURONTIN) 300 MG capsule Take 2 capsules (600 mg total) by mouth 3  (three) times daily. 07/30/21 10/03/21  Claiborne Rigg, NP  ibuprofen (ADVIL) 600 MG tablet Take 1 tablet (600 mg total) by mouth every 8 (eight) hours as needed for moderate pain. Patient not taking: Reported on 09/18/2021 07/16/21   Henrene Dodge, MD  Multiple Vitamins-Minerals (WOMENS 50+ MULTI VITAMIN/MIN PO) Take 1 tablet by mouth daily.    [provider]  omeprazole (PRILOSEC) 40 MG capsule Take 1 capsule (40 mg total) by mouth daily. 09/18/21   Claiborne Rigg, NP  ondansetron (ZOFRAN ODT) 4 MG disintegrating tablet Take 1 tablet (4 mg total) by mouth every 8 (eight) hours as needed for nausea or vomiting. 09/16/21   Sharman Cheek, MD  thiamine 100 MG tablet Take 1 tablet (100 mg total) by mouth daily. 04/02/21   Hongalgi, Maximino Greenland, MD  vitamin B-12 (CYANOCOBALAMIN) 1000 MCG tablet Take 1 tablet (1,000 mcg total) by mouth daily. 03/15/21   Marrion Coy, MD    Allergies Patient has no known allergies.  Family History  Problem Relation Age of Onset   Hepatitis C Mother     Social History Social History   Tobacco Use   Smoking status: Every Day    Packs/day: 0.50    Types: Cigarettes   Smokeless tobacco: Never  Vaping Use   Vaping Use: Some days  Substance Use Topics   Alcohol use: No    Comment: rarely    Drug use: No    Review of Systems Constitutional: No fever/chills Eyes: No visual changes. ENT: No sore throat. Cardiovascular: Denies chest pain. Respiratory: Denies shortness of breath. Gastrointestinal: Endorses generalized abdominal pain with the worst in the midepigastric region with associated nausea/vomiting.  No diarrhea. Genitourinary: Negative for dysuria. Musculoskeletal: Negative for acute arthralgias Skin: Negative for rash. Neurological: Negative for headaches, weakness/numbness/paresthesias in any extremity Psychiatric: Negative for suicidal ideation/homicidal ideation ____________________________________________  PHYSICAL EXAM:  VITAL  SIGNS: ED Triage Vitals  Enc Vitals Group     BP 10/04/21 0909 123/80     Pulse Rate 10/04/21 0909 98     Resp 10/04/21 0909 16     Temp 10/04/21 0909 98 F (36.7 C)     Temp Source 10/04/21 0909 Oral     SpO2 10/04/21 0909 99 %     Weight 10/04/21 0903 136 lb 3.9 oz (61.8 kg)     Height 10/04/21 0903 5\' 8"  (1.727 m)     Head Circumference --      Peak Flow --      Pain Score 10/04/21 0903 5     Pain Loc --      Pain Edu? --      Excl. in GC? --    Constitutional: Alert and oriented. Well appearing and in no acute distress. Eyes: Conjunctivae are normal. PERRL. Head: Atraumatic. Nose: No congestion/rhinnorhea. Mouth/Throat: Mucous membranes are moist. Neck: No stridor Cardiovascular: Grossly normal heart sounds.  Good peripheral circulation. Respiratory: Normal respiratory effort.  No retractions. Gastrointestinal: Soft and  mild midepigastric tenderness to palpation. No distention. Musculoskeletal: No obvious deformities Neurologic:  Normal speech and language. No gross focal neurologic deficits are appreciated. Skin:  Skin is warm and dry. No rash noted. Psychiatric: Mood and affect are normal. Speech and behavior are normal.  ____________________________________________   LABS (all labs ordered are listed, but only abnormal results are displayed)  Labs Reviewed  COMPREHENSIVE METABOLIC PANEL - Abnormal; Notable for the following components:      Result Value   Sodium 131 (*)    CO2 20 (*)    Glucose, Bld 108 (*)    Total Protein 8.3 (*)    Total Bilirubin 1.7 (*)    All other components within normal limits  CBC - Abnormal; Notable for the following components:   WBC 11.5 (*)    Hemoglobin 15.2 (*)    HCT 46.3 (*)    All other components within normal limits  URINALYSIS, ROUTINE W REFLEX MICROSCOPIC - Abnormal; Notable for the following components:   APPearance CLOUDY (*)    Specific Gravity, Urine >1.030 (*)    Hgb urine dipstick TRACE (*)    Bilirubin Urine  MODERATE (*)    Ketones, ur >160 (*)    Protein, ur 30 (*)    All other components within normal limits  URINALYSIS, MICROSCOPIC (REFLEX) - Abnormal; Notable for the following components:   Bacteria, UA RARE (*)    All other components within normal limits  LIPASE, BLOOD  POC URINE PREG, ED   ____________________________________________  RADIOLOGY  ED MD interpretation: Right upper quadrant ultrasound shows mild enlargement of the common bile duct with hepatic steatosis and cystic lesion in the pancreatic head that is unchanged from prior CT  Official radiology report(s): US Abdomen Limited RUQ (LIVER/GB)  Result Date: 10/04/2021 CLINICAL DATA:  Right upper quadrant pain, cholecystectomy EXAM: ULTRASOUND ABDOMEN LIMITED RIGHT UPPER QUADRANT COMPARISON:  Right upper quadrant ultrasound, 06/24/2021, CT abdomen, 04/30/2021 FINDINGS: Gallbladder: Status post cholecystectomy. Common bile duct: Diameter: 8 mm Liver: No focal lesion identified. Heterogeneously increased parenchymal echogenicity. Portal vein is patent on color Doppler imaging with normal direction of blood flow towards the liver. Other: Cystic lesion within the pancreatic head measuring 3.9 x 3.4 x 3.9 cm. IMPRESSION: 1. Status post cholecystectomy. 2. Mild enlargement of the common bile duct up to 8 mm, likely postoperative biliary ductal dilatation. 3. Hepatic steatosis 4. Cystic lesion within the pancreatic head measuring up to 3.9 cm, likely a pseudocyst and as seen on prior CT. Electronically Signed   By: Jearld Lesch M.D.   On: 10/04/2021 11:52    ____________________________________________   PROCEDURES  Procedure(s) performed (including Critical Care):  .1-3 Lead EKG Interpretation Performed by: Merwyn Katos, MD Authorized by: Merwyn Katos, MD     Interpretation: normal     ECG rate:  89   ECG rate assessment: normal     Rhythm: sinus rhythm     Ectopy: none     Conduction: normal      ____________________________________________   INITIAL IMPRESSION / ASSESSMENT AND PLAN / ED COURSE  As part of my medical decision making, I reviewed the following data within the electronic medical record, if available:  Nursing notes reviewed and incorporated, Labs reviewed, EKG interpreted, Old chart reviewed, Radiograph reviewed and Notes from prior ED visits reviewed and incorporated      Patient presents for acute nausea/vomiting with midepigastric abdominal pain consistent with recurrence of her gastritis The cause of the patients symptoms is  not clear, but the patient is overall well appearing and is suspected to have a transient course of illness.  Given History and Exam there does not appear to be an emergent cause of the symptoms such as small bowel obstruction, coronary syndrome, bowel ischemia, DKA, pancreatitis, appendicitis, other acute abdomen or other emergent problem.  Reassessment: After treatment, the patient is feeling much better, tolerating PO fluids, and shows no signs of dehydration.   Disposition: Discharge home with prompt primary care physician follow up in the next 48 hours. Strict return precautions discussed.     ____________________________________________   FINAL CLINICAL IMPRESSION(S) / ED DIAGNOSES  Final diagnoses:  RUQ pain  Nausea  Acute gastritis without hemorrhage, unspecified gastritis type     ED Discharge Orders          Ordered    ondansetron (ZOFRAN) 4 MG tablet  Daily PRN        10/04/21 1252    famotidine (PEPCID) 40 MG tablet  Every evening        10/04/21 1252    sucralfate (CARAFATE) 1 g tablet  3 times daily with meals & bedtime        10/04/21 1252             Note:  This document was prepared using Dragon voice recognition software and may include unintentional dictation errors.    Merwyn Katos, MD 10/04/21 1450

## 2021-10-04 NOTE — ED Notes (Signed)
ED Provider at bedside. 

## 2021-10-07 ENCOUNTER — Other Ambulatory Visit: Payer: Self-pay

## 2021-10-08 ENCOUNTER — Other Ambulatory Visit: Payer: Self-pay

## 2021-10-14 ENCOUNTER — Other Ambulatory Visit: Payer: Self-pay

## 2021-10-15 ENCOUNTER — Encounter: Payer: Self-pay | Admitting: Emergency Medicine

## 2021-10-15 ENCOUNTER — Other Ambulatory Visit: Payer: Self-pay

## 2021-10-15 ENCOUNTER — Emergency Department: Payer: Self-pay

## 2021-10-15 ENCOUNTER — Inpatient Hospital Stay
Admission: EM | Admit: 2021-10-15 | Discharge: 2021-10-18 | DRG: 439 | Disposition: A | Discharge status: Home / Self Care | Payer: Self-pay | Attending: Family Medicine | Admitting: Family Medicine

## 2021-10-15 DIAGNOSIS — K859 Acute pancreatitis without necrosis or infection, unspecified: Secondary | ICD-10-CM

## 2021-10-15 DIAGNOSIS — K219 Gastro-esophageal reflux disease without esophagitis: Secondary | ICD-10-CM | POA: Diagnosis present

## 2021-10-15 DIAGNOSIS — N83299 Other ovarian cyst, unspecified side: Secondary | ICD-10-CM | POA: Diagnosis present

## 2021-10-15 DIAGNOSIS — Z79899 Other long term (current) drug therapy: Secondary | ICD-10-CM

## 2021-10-15 DIAGNOSIS — E860 Dehydration: Secondary | ICD-10-CM | POA: Diagnosis present

## 2021-10-15 DIAGNOSIS — K8521 Alcohol induced acute pancreatitis with uninfected necrosis: Principal | ICD-10-CM | POA: Diagnosis present

## 2021-10-15 DIAGNOSIS — K863 Pseudocyst of pancreas: Secondary | ICD-10-CM | POA: Diagnosis present

## 2021-10-15 DIAGNOSIS — D509 Iron deficiency anemia, unspecified: Secondary | ICD-10-CM | POA: Diagnosis present

## 2021-10-15 DIAGNOSIS — Z8619 Personal history of other infectious and parasitic diseases: Secondary | ICD-10-CM

## 2021-10-15 DIAGNOSIS — I10 Essential (primary) hypertension: Secondary | ICD-10-CM | POA: Diagnosis present

## 2021-10-15 DIAGNOSIS — K449 Diaphragmatic hernia without obstruction or gangrene: Secondary | ICD-10-CM | POA: Diagnosis present

## 2021-10-15 DIAGNOSIS — Z20822 Contact with and (suspected) exposure to covid-19: Secondary | ICD-10-CM | POA: Diagnosis present

## 2021-10-15 DIAGNOSIS — K852 Alcohol induced acute pancreatitis without necrosis or infection: Secondary | ICD-10-CM

## 2021-10-15 DIAGNOSIS — F17210 Nicotine dependence, cigarettes, uncomplicated: Secondary | ICD-10-CM | POA: Diagnosis present

## 2021-10-15 DIAGNOSIS — G629 Polyneuropathy, unspecified: Secondary | ICD-10-CM | POA: Diagnosis present

## 2021-10-15 DIAGNOSIS — K861 Other chronic pancreatitis: Secondary | ICD-10-CM | POA: Diagnosis present

## 2021-10-15 LAB — COMPREHENSIVE METABOLIC PANEL
ALT: 20 U/L (ref 0–44)
AST: 30 U/L (ref 15–41)
Albumin: 4.2 g/dL (ref 3.5–5.0)
Alkaline Phosphatase: 90 U/L (ref 38–126)
Anion gap: 9 (ref 5–15)
BUN: 9 mg/dL (ref 6–20)
CO2: 25 mmol/L (ref 22–32)
Calcium: 9.4 mg/dL (ref 8.9–10.3)
Chloride: 103 mmol/L (ref 98–111)
Creatinine, Ser: 0.59 mg/dL (ref 0.44–1.00)
GFR, Estimated: >60 mL/min (ref >60)
Glucose, Bld: 161 mg/dL — ABNORMAL HIGH (ref 70–99)
Potassium: 4 mmol/L (ref 3.5–5.1)
Sodium: 137 mmol/L (ref 135–145)
Total Bilirubin: 0.8 mg/dL (ref 0.3–1.2)
Total Protein: 7.7 g/dL (ref 6.5–8.1)

## 2021-10-15 LAB — URINALYSIS, ROUTINE W REFLEX MICROSCOPIC
Bacteria, UA: NONE SEEN
Bilirubin Urine: NEGATIVE
Glucose, UA: NEGATIVE mg/dL
Ketones, ur: 20 mg/dL — AB
Leukocytes,Ua: NEGATIVE
Nitrite: NEGATIVE
Protein, ur: NEGATIVE mg/dL
Specific Gravity, Urine: 1.018 (ref 1.005–1.030)
pH: 5 (ref 5.0–8.0)

## 2021-10-15 LAB — CBC
HCT: 45.5 % (ref 36.0–46.0)
Hemoglobin: 15.2 g/dL — ABNORMAL HIGH (ref 12.0–15.0)
MCH: 30.4 pg (ref 26.0–34.0)
MCHC: 33.4 g/dL (ref 30.0–36.0)
MCV: 91 fL (ref 80.0–100.0)
Platelets: 324 10*3/uL (ref 150–400)
RBC: 5 MIL/uL (ref 3.87–5.11)
RDW: 14.5 % (ref 11.5–15.5)
WBC: 10.4 10*3/uL (ref 4.0–10.5)
nRBC: 0 % (ref 0.0–0.2)

## 2021-10-15 LAB — LIPASE, BLOOD: Lipase: 283 U/L — ABNORMAL HIGH (ref 11–51)

## 2021-10-15 LAB — RESP PANEL BY RT-PCR (FLU A&B, COVID) ARPGX2
Influenza A by PCR: NEGATIVE
Influenza B by PCR: NEGATIVE
SARS Coronavirus 2 by RT PCR: NEGATIVE

## 2021-10-15 LAB — POC URINE PREG, ED: Preg Test, Ur: NEGATIVE

## 2021-10-15 LAB — GLUCOSE, CAPILLARY: Glucose-Capillary: 85 mg/dL (ref 70–99)

## 2021-10-15 MED ORDER — SODIUM CHLORIDE 0.9 % IV BOLUS
1000.0000 mL | Freq: Once | INTRAVENOUS | Status: AC
  Administered 2021-10-15: 09:00:00 1000 mL via INTRAVENOUS

## 2021-10-15 MED ORDER — SODIUM CHLORIDE 0.9 % IV SOLN
INTRAVENOUS | Status: DC | PRN
  Administered 2021-10-15: 21:00:00 10 mL via INTRAVENOUS

## 2021-10-15 MED ORDER — THIAMINE HCL 100 MG PO TABS
100.0000 mg | ORAL_TABLET | Freq: Every day | ORAL | Status: DC
  Administered 2021-10-15 – 2021-10-18 (×4): 100 mg via ORAL
  Filled 2021-10-15 (×4): qty 1

## 2021-10-15 MED ORDER — ONDANSETRON HCL 4 MG/2ML IJ SOLN
4.0000 mg | Freq: Four times a day (QID) | INTRAMUSCULAR | Status: DC | PRN
  Administered 2021-10-16 – 2021-10-17 (×3): 4 mg via INTRAVENOUS
  Filled 2021-10-15 (×3): qty 2

## 2021-10-15 MED ORDER — FENTANYL CITRATE PF 50 MCG/ML IJ SOSY
12.5000 ug | PREFILLED_SYRINGE | INTRAMUSCULAR | Status: DC | PRN
  Administered 2021-10-15 – 2021-10-16 (×3): 50 ug via INTRAVENOUS
  Filled 2021-10-15 (×3): qty 1

## 2021-10-15 MED ORDER — FAMOTIDINE 20 MG PO TABS
40.0000 mg | ORAL_TABLET | Freq: Every evening | ORAL | Status: DC
  Administered 2021-10-15 – 2021-10-17 (×3): 40 mg via ORAL
  Filled 2021-10-15 (×3): qty 2

## 2021-10-15 MED ORDER — ADULT MULTIVITAMIN W/MINERALS CH
ORAL_TABLET | Freq: Every day | ORAL | Status: DC
  Administered 2021-10-15 – 2021-10-18 (×4): 1 via ORAL
  Filled 2021-10-15 (×4): qty 1

## 2021-10-15 MED ORDER — LACTATED RINGERS IV SOLN: INTRAVENOUS | Status: DC

## 2021-10-15 MED ORDER — PANTOPRAZOLE SODIUM 40 MG PO TBEC
80.0000 mg | DELAYED_RELEASE_TABLET | Freq: Every day | ORAL | Status: DC
  Administered 2021-10-15 – 2021-10-18 (×4): 80 mg via ORAL
  Filled 2021-10-15 (×4): qty 2

## 2021-10-15 MED ORDER — ONDANSETRON 4 MG PO TBDP
4.0000 mg | ORAL_TABLET | Freq: Once | ORAL | Status: AC
  Administered 2021-10-15: 07:00:00 4 mg via ORAL
  Filled 2021-10-15: qty 1

## 2021-10-15 MED ORDER — ACETAMINOPHEN 650 MG RE SUPP: 650.0000 mg | Freq: Four times a day (QID) | RECTAL | Status: DC | PRN

## 2021-10-15 MED ORDER — PANTOPRAZOLE SODIUM 40 MG IV SOLR
40.0000 mg | Freq: Once | INTRAVENOUS | Status: AC
  Administered 2021-10-15: 09:00:00 40 mg via INTRAVENOUS
  Filled 2021-10-15: qty 40

## 2021-10-15 MED ORDER — FOLIC ACID 1 MG PO TABS
1.0000 mg | ORAL_TABLET | Freq: Every day | ORAL | Status: DC
  Administered 2021-10-15 – 2021-10-18 (×4): 1 mg via ORAL
  Filled 2021-10-15 (×4): qty 1

## 2021-10-15 MED ORDER — IOHEXOL 300 MG/ML  SOLN
100.0000 mL | Freq: Once | INTRAMUSCULAR | Status: AC | PRN
  Administered 2021-10-15: 10:00:00 100 mL via INTRAVENOUS
  Filled 2021-10-15: qty 100

## 2021-10-15 MED ORDER — GABAPENTIN 300 MG PO CAPS
600.0000 mg | ORAL_CAPSULE | Freq: Three times a day (TID) | ORAL | Status: DC
  Administered 2021-10-15 – 2021-10-18 (×10): 600 mg via ORAL
  Filled 2021-10-15 (×10): qty 2

## 2021-10-15 MED ORDER — VITAMIN B-12 1000 MCG PO TABS
1000.0000 ug | ORAL_TABLET | Freq: Every day | ORAL | Status: DC
Start: 2021-10-15 — End: 2021-10-18
  Administered 2021-10-15 – 2021-10-18 (×4): 1000 ug via ORAL
  Filled 2021-10-15 (×4): qty 1

## 2021-10-15 MED ORDER — ONDANSETRON HCL 4 MG PO TABS
4.0000 mg | ORAL_TABLET | Freq: Four times a day (QID) | ORAL | Status: DC | PRN
  Administered 2021-10-16 – 2021-10-18 (×2): 4 mg via ORAL
  Filled 2021-10-15 (×2): qty 1

## 2021-10-15 MED ORDER — SODIUM CHLORIDE 0.9 % IV SOLN
12.5000 mg | Freq: Four times a day (QID) | INTRAVENOUS | Status: DC | PRN
  Administered 2021-10-15 (×2): 12.5 mg via INTRAVENOUS
  Filled 2021-10-15: qty 0.5
  Filled 2021-10-15 (×2): qty 12.5

## 2021-10-15 MED ORDER — ACETAMINOPHEN 500 MG PO TABS
1000.0000 mg | ORAL_TABLET | Freq: Once | ORAL | Status: AC
  Administered 2021-10-15: 11:00:00 1000 mg via ORAL
  Filled 2021-10-15: qty 2

## 2021-10-15 MED ORDER — ACETAMINOPHEN 325 MG PO TABS
650.0000 mg | ORAL_TABLET | Freq: Four times a day (QID) | ORAL | Status: DC | PRN
  Filled 2021-10-15: qty 2

## 2021-10-15 NOTE — ED Notes (Signed)
States took her Gastritis med. Kept throwing up and could not eat so she came in.

## 2021-10-15 NOTE — ED Provider Notes (Signed)
[facility] Emergency Department Provider Note  ____________________________________________   Event Date/Time   First MD Initiated Contact with Patient 10/15/21 [phone]     (approximate)  I have reviewed the triage vital signs and the nursing notes.   HISTORY  Chief Complaint Abdominal Pain    HPI Sandra Pearson is a 35 y.o. 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  Patient reports pain in her epigastric area over the past 2 days, constant, nothing makes it better or worse.  She does report drinking alcohol a few drinks on Sunday prior to it starting.  She is not sure that flared it up.  She states that she has had associated nausea with it.  Denies chest pain, shortness of breath or other concerns          Past Medical History:  Diagnosis Date   Acute necrotizing pancreatitis 05/2021   Anemia    was iron deficiency but labs improved   GERD (gastroesophageal reflux disease)    with esophagitis   H. pylori infection 05/2021   stopped medication for this as it may have effected pancreatitis   History of blood transfusion    Hypertension    has never been treated for htn   Hypomagnesemia    Takes iron supplements     Patient Active Problem List   Diagnosis Date Noted   Gallstones 09/23/2021   Gallstone pancreatitis    Acute pancreatitis 03/29/2021   Normocytic anemia 03/29/2021   GERD with esophagitis 03/29/2021   H. pylori infection 03/29/2021   Chest pain 03/29/2021   Severe acute pancreatitis 03/29/2021   Hypertension    Atypical chest pain    Iron deficiency anemia    Gastric erythema    Hiatal hernia    Acute esophagitis    Acute anemia 03/13/2021   Severe anemia 03/12/2021   Hypokalemia 03/12/2021   Hypomagnesemia 03/12/2021   Complex ovarian cyst 11/22/2018   Nausea and vomiting in  adult 11/22/2018    Past Surgical History:  Procedure Laterality Date   COLONOSCOPY N/A 03/15/2021   Procedure: COLONOSCOPY;  Surgeon: Pasty Spillers, MD;  Location: [facility] ENDOSCOPY;  Service: Endoscopy;  Laterality: N/A;   ESOPHAGOGASTRODUODENOSCOPY N/A 03/15/2021   Procedure: ESOPHAGOGASTRODUODENOSCOPY (EGD);  Surgeon: Pasty Spillers, MD;  Location: [facility] ENDOSCOPY;  Service: Endoscopy;  Laterality: N/A;   MANDIBLE FRACTURE SURGERY  1999   unsure if any metal. does not think so    Prior to Admission medications   Medication Sig Start Date End Date Taking? Authorizing Provider  acetaminophen (TYLENOL) 500 MG tablet Take 2 tablets (1,000 mg total) by mouth every 6 (six) hours as needed for mild pain. 07/16/21   Henrene Dodge, MD  aluminum-magnesium hydroxide-simethicone (MAALOX) 200-200-20 MG/5ML SUSP Take 30 mLs by mouth 4 (four) times daily -  before meals and at bedtime. 09/16/21   Sharman Cheek, MD  famotidine (PEPCID) 40 MG tablet Take 1 tablet (40 mg total) by mouth every evening. 10/04/21 11/03/21  Merwyn Katos, MD  ferrous sulfate 325 (65 FE) MG tablet Take 1 tablet (325 mg total) by mouth daily. 03/15/21 07/09/21  Marrion Coy, MD  folic acid (FOLVITE) 1 MG tablet Take 1 tablet (1 mg total) by mouth daily. 09/04/21 12/03/21  Claiborne Rigg, NP  gabapentin (NEURONTIN) 300 MG capsule Take 2 capsules (600 mg total) by mouth 3 (three)  times daily. 07/30/21 11/07/21  Claiborne Rigg, NP  ibuprofen (ADVIL) 600 MG tablet Take 1 tablet (600 mg total) by mouth every 8 (eight) hours as needed for moderate pain. Patient not taking: Reported on 09/18/2021 07/16/21   Henrene Dodge, MD  Multiple Vitamins-Minerals (WOMENS 50+ MULTI VITAMIN/MIN PO) Take 1 tablet by mouth daily.    [provider]  omeprazole (PRILOSEC) 40 MG capsule Take 1 capsule (40 mg total) by mouth daily. 09/18/21   Claiborne Rigg, NP  ondansetron (ZOFRAN ODT) 4 MG disintegrating tablet Take 1 tablet (4 mg  total) by mouth every 8 (eight) hours as needed for nausea or vomiting. 09/16/21   Sharman Cheek, MD  ondansetron (ZOFRAN) 4 MG tablet Take 1 tablet (4 mg total) by mouth daily as needed for nausea or vomiting. 10/04/21 10/04/22  Merwyn Katos, MD  sucralfate (CARAFATE) 1 g tablet Take 1 tablet (1 g total) by mouth 4 (four) times daily -  with meals and at bedtime. 10/04/21 11/03/21  Merwyn Katos, MD  thiamine 100 MG tablet Take 1 tablet (100 mg total) by mouth daily. 04/02/21   Hongalgi, Maximino Greenland, MD  vitamin B-12 (CYANOCOBALAMIN) 1000 MCG tablet Take 1 tablet (1,000 mcg total) by mouth daily. 03/15/21   Marrion Coy, MD    Allergies Patient has no known allergies.  Family History  Problem Relation Age of Onset   Hepatitis C Mother     Social History Social History   Tobacco Use   Smoking status: Every Day    Packs/day: 0.50    Types: Cigarettes   Smokeless tobacco: Never  Vaping Use   Vaping Use: Some days  Substance Use Topics   Alcohol use: No    Comment: rarely    Drug use: No      Review of Systems Constitutional: No fever/chills Eyes: No visual changes. ENT: No sore throat. Cardiovascular: Denies chest pain. Respiratory: Denies shortness of breath. Gastrointestinal: Abdominal pain, nausea, vomiting Genitourinary: Negative for dysuria. Musculoskeletal: Negative for back pain. Skin: Negative for rash. Neurological: Negative for headaches, focal weakness or numbness. All other ROS negative ____________________________________________   PHYSICAL EXAM:  VITAL SIGNS: ED Triage Vitals  Enc Vitals Group     BP 10/15/21 0416 135/88     Pulse Rate 10/15/21 0416 85     Resp 10/15/21 0416 18     Temp 10/15/21 0416 97.7 F (36.5 C)     Temp Source 10/15/21 0416 Oral     SpO2 10/15/21 0416 98 %     Weight 10/15/21 0754 136 lb 3.9 oz (61.8 kg)     Height 10/15/21 0754 5\' 8"  (1.727 m)     Head Circumference --      Peak Flow --      Pain Score 10/15/21 0808 7      Pain Loc --      Pain Edu? --      Excl. in GC? --     Constitutional: Alert and oriented. Well appearing and in no acute distress. Eyes: Conjunctivae are normal. EOMI. Head: Atraumatic. Nose: No congestion/rhinnorhea. Mouth/Throat: Mucous membranes are moist.   Neck: No stridor. Trachea Midline. FROM Cardiovascular: Normal rate, regular rhythm. Grossly normal heart sounds.  Good peripheral circulation. Respiratory: Normal respiratory effort.  No retractions. Lungs CTAB. Gastrointestinal: epigastric abdominal pain No distention. No abdominal bruits.  Musculoskeletal: No lower extremity tenderness nor edema.  No joint effusions. Neurologic:  Normal speech and language. No gross focal neurologic deficits  are appreciated.  Skin:  Skin is warm, dry and intact. No rash noted. Psychiatric: Mood and affect are normal. Speech and behavior are normal. GU: Deferred   ____________________________________________   LABS (all labs ordered are listed, but only abnormal results are displayed)  Labs Reviewed  LIPASE, BLOOD - Abnormal; Notable for the following components:      Result Value   Lipase 283 (*)    All other components within normal limits  COMPREHENSIVE METABOLIC PANEL - Abnormal; Notable for the following components:   Glucose, Bld 161 (*)    All other components within normal limits  CBC - Abnormal; Notable for the following components:   Hemoglobin 15.2 (*)    All other components within normal limits  URINALYSIS, ROUTINE W REFLEX MICROSCOPIC  POC URINE PREG, ED   ____________________________________________ RADIOLOGY   Official radiology report(s): CT ABDOMEN PELVIS W CONTRAST  Result Date: 10/15/2021 CLINICAL DATA:  Right upper quadrant abdominal pain, nausea and vomiting since midnight. EXAM: CT ABDOMEN AND PELVIS WITH CONTRAST TECHNIQUE: Multidetector CT imaging of the abdomen and pelvis was performed using the standard protocol following bolus administration of  intravenous contrast. CONTRAST:  OMNIPAQUE IOHEXOL 300 MG/ML  SOLN COMPARISON:  04/30/2021 CT abdomen.  03/29/2021 CT abdomen/pelvis. FINDINGS: Lower chest: No significant pulmonary nodules or acute consolidative airspace disease. Hepatobiliary: Normal liver size. Hypodense 0.9 cm peripheral right liver lesion, seen to represent a benign hemangioma on 03/29/2021. no new liver lesions. Gallbladder is not discretely visualized and is either collapsed or surgically absent. No biliary ductal dilatation. CBD diameter 4 mm. Pancreas: Prominent peripancreatic fat stranding and ill-defined fluid surrounding the pancreatic head and neck, compatible with acute pancreatitis. Mixed attenuation 4.6 x 4.4 cm pancreatic head mass (series 5/image 31), mildly decreased from 5.2 x 5.1 cm on 03/29/2021 CT. No new pancreatic masses. No significant pancreatic duct dilation. No measurable peripancreatic fluid collections. Spleen: Normal size. No mass. Adrenals/Urinary Tract: Normal adrenals. Normal kidneys with no hydronephrosis and no renal mass. Normal bladder. Stomach/Bowel: Normal non-distended stomach. Normal caliber small bowel with no small bowel wall thickening. Appendix not discretely visualized. No pericecal inflammatory changes. Normal large bowel with no diverticulosis, large bowel wall thickening or pericolonic fat stranding. Vascular/Lymphatic: Normal caliber abdominal aorta. Patent hepatic, portal, splenic and renal veins. Prominent peripancreatic collateral vessels at the pancreatic head, similar. No pathologically enlarged lymph nodes in the abdomen or pelvis. Reproductive: Grossly normal uterus.  No adnexal mass. Other: No pneumoperitoneum.  No ascites. Musculoskeletal: No aggressive appearing focal osseous lesions. IMPRESSION: 1. Recurrent acute pancreatitis in the pancreatic head. 2. 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 Continued MRI abdomen without and with IV contrast follow-up warranted in 3-6 months. 3. No biliary ductal dilatation. Gallbladder not discretely visualized and is either collapsed or surgically absent. Electronically Signed   By: Delbert Phenix M.D.   On: 10/15/2021 10:35    ____________________________________________   PROCEDURES  Procedure(s) performed (including Critical Care):  Procedures   ____________________________________________   INITIAL IMPRESSION / ASSESSMENT AND PLAN / ED COURSE  Sandra Pearson was evaluated in Emergency Department on 10/15/2021 for the symptoms described in the history of present illness. She was evaluated in the context of the global COVID-19 pandemic, which necessitated consideration that the patient might be at risk for infection with the SARS-CoV-2 virus that causes COVID-19. Institutional protocols and algorithms that pertain to the evaluation of patients  at risk for COVID-19 are in a state of rapid change based on information released by regulatory bodies including the CDC and federal and state organizations. These policies and algorithms were followed during the patient's care in the ED.     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  Labs ordered to make sure evidence of retained stone, pancreatitis.  Lipase was significantly elevated but LFTs are normal therefore unlikely secondary to retained stone.  Suspect this is related to alcohol use.  Labs ordered to evaluate for any electrolyte abnormalities, AKI.  Patient's had previous pseudocyst so we will get CT scan to further evaluate to see if there is any expansion of this.  We will give patient fluids, Protonix, Phenergan to help with symptoms.  Prior triglyceride levels have been normal.  Patient CT scan shows acute pancreatitis as well as slightly decreased size of her known chronic  hemorrhagic.   I recommend MRI abdomen with and without in 3 to 6 months.  Her gallbladder is surgically absent.  Reevaluated patient.  Pain is better improved we discussed admission versus going home.  Patient felt more comfortable coming to the hospital. Dr. Allegra Lai informed that no special procedures needed for the hemorrhagic cyst given this is chronic and known and is decreased in size        ____________________________________________   FINAL CLINICAL IMPRESSION(S) / ED DIAGNOSES   Final diagnoses:  Alcohol-induced acute pancreatitis, unspecified complication status      MEDICATIONS GIVEN DURING THIS VISIT:  Medications  sodium chloride 0.9 % bolus 1,000 mL (has no administration in time range)  pantoprazole (PROTONIX) injection 40 mg (has no administration in time range)  promethazine (PHENERGAN) 12.5 mg in sodium chloride 0.9 % 50 mL IVPB (has no administration in time range)  ondansetron (ZOFRAN-ODT) disintegrating tablet 4 mg (4 mg Oral Given 10/15/21 0716)     ED Discharge Orders     None        Note:  This document was prepared using Dragon voice recognition software and may include unintentional dictation errors.    Concha Se, MD 10/15/21 [phone]

## 2021-10-15 NOTE — H&P (Addendum)
H&P:    [facility]   ZTI:[id number] DOB: 1986-10-09 DOA: 10/15/2021  PCP: Claiborne Rigg, NP  Chief Complaint: Nausea, vomiting, abdominal pain  History of Present Illness:    HPI: Sandra Pearson is a 35 y.o. female with a past medical history of recurrent pancreatitis (gallstone pancreatitis status post cholecystectomy and necrotizing pancreatitis), tobacco dependence (currently smokes 1/2 ppd), GERD/gastritis.  This patient presents to the emergency department with nausea, vomiting and epigastric abdominal pain since midnight.  2 episodes of nonbloody nonbilious emesis.  Abdominal pain located in the epigastric area with radiation to the back.  Describes as contractions.  Originally 8 out of 10 but now improved to 4 out of 10 upon my evaluation.  Likely trigger was drinking 2-3 beers on Sunday.  She states she does not drink alcohol daily.  States she is hungry and will would like to try a clear liquid diet.  No fevers or chills.  No chest pain.  No diarrhea.  No urinary complaints.  ED Course: CT abdomen pelvis with contrast shows recurrent acute pancreatitis.  She has been given IV fluids with 1 L of normal saline bolus, antiemetics with Zofran.  In addition has been given Protonix and Tylenol.  Labs fairly unremarkable.  Lipase only 283.  Urinalysis is unremarkable.    ROS:   14 point review of systems is negative except for what is mentioned above in the HPI.   Past Medical History:   Past Medical History:  Diagnosis Date   Acute necrotizing pancreatitis 05/2021   Anemia    was iron deficiency but labs improved   GERD (gastroesophageal reflux disease)    with esophagitis   H. pylori infection 05/2021   stopped medication for this as it may have effected pancreatitis   History of blood transfusion    Hypertension    has never been treated for htn   Hypomagnesemia    Takes iron supplements     Past Surgical History:   Past Surgical  History:  Procedure Laterality Date   COLONOSCOPY N/A 03/15/2021   Procedure: COLONOSCOPY;  Surgeon: Pasty Spillers, MD;  Location: [facility] ENDOSCOPY;  Service: Endoscopy;  Laterality: N/A;   ESOPHAGOGASTRODUODENOSCOPY N/A 03/15/2021   Procedure: ESOPHAGOGASTRODUODENOSCOPY (EGD);  Surgeon: Pasty Spillers, MD;  Location: [facility] ENDOSCOPY;  Service: Endoscopy;  Laterality: N/A;   MANDIBLE FRACTURE SURGERY  1999   unsure if any metal. does not think so    Social History:   Social History   Socioeconomic History   Marital status: Married    Spouse name: Jeannett Senior   Number of children: Not on file   Years of education: Not on file   Highest education level: Not on file  Occupational History   Occupation: homemaker  Tobacco Use   Smoking status: Every Day    Packs/day: 0.50    Types: Cigarettes   Smokeless tobacco: Never  Vaping Use   Vaping Use: Some days  Substance and Sexual Activity   Alcohol use: No    Comment: rarely    Drug use: No   Sexual activity: Not Currently    Birth control/protection: None  Other Topics Concern   Not on file  Social History Narrative   Patient lives in her home with her husband. She states that she feels safe in her home.   She does not work. Has a little puppy.   Social Determinants of Health   Financial Resource Strain: Not on file  Food  Insecurity: Not on file  Transportation Needs: Not on file  Physical Activity: Not on file  Stress: Not on file  Social Connections: Not on file  Intimate Partner Violence: Not on file    Allergies:   No Known Allergies  Family History:   Family History  Problem Relation Age of Onset   Hepatitis C Mother      Current Medications:   Prior to Admission medications   Medication Sig Start Date End Date Taking? Authorizing Provider  acetaminophen (TYLENOL) 500 MG tablet Take 2 tablets (1,000 mg total) by mouth every 6 (six) hours as needed for mild pain. 07/16/21   Henrene Dodge, MD   aluminum-magnesium hydroxide-simethicone (MAALOX) 200-200-20 MG/5ML SUSP Take 30 mLs by mouth 4 (four) times daily -  before meals and at bedtime. 09/16/21   Sharman Cheek, MD  famotidine (PEPCID) 40 MG tablet Take 1 tablet (40 mg total) by mouth every evening. 10/04/21 11/03/21  Merwyn Katos, MD  ferrous sulfate 325 (65 FE) MG tablet Take 1 tablet (325 mg total) by mouth daily. 03/15/21 07/09/21  Marrion Coy, MD  folic acid (FOLVITE) 1 MG tablet Take 1 tablet (1 mg total) by mouth daily. 09/04/21 12/03/21  Claiborne Rigg, NP  gabapentin (NEURONTIN) 300 MG capsule Take 2 capsules (600 mg total) by mouth 3 (three) times daily. 07/30/21 11/07/21  Claiborne Rigg, NP  ibuprofen (ADVIL) 600 MG tablet Take 1 tablet (600 mg total) by mouth every 8 (eight) hours as needed for moderate pain. Patient not taking: Reported on 09/18/2021 07/16/21   Henrene Dodge, MD  Multiple Vitamins-Minerals (WOMENS 50+ MULTI VITAMIN/MIN PO) Take 1 tablet by mouth daily.    [provider]  omeprazole (PRILOSEC) 40 MG capsule Take 1 capsule (40 mg total) by mouth daily. 09/18/21   Claiborne Rigg, NP  ondansetron (ZOFRAN ODT) 4 MG disintegrating tablet Take 1 tablet (4 mg total) by mouth every 8 (eight) hours as needed for nausea or vomiting. 09/16/21   Sharman Cheek, MD  ondansetron (ZOFRAN) 4 MG tablet Take 1 tablet (4 mg total) by mouth daily as needed for nausea or vomiting. 10/04/21 10/04/22  Merwyn Katos, MD  sucralfate (CARAFATE) 1 g tablet Take 1 tablet (1 g total) by mouth 4 (four) times daily -  with meals and at bedtime. 10/04/21 11/03/21  Merwyn Katos, MD  thiamine 100 MG tablet Take 1 tablet (100 mg total) by mouth daily. 04/02/21   Hongalgi, Maximino Greenland, MD  vitamin B-12 (CYANOCOBALAMIN) 1000 MCG tablet Take 1 tablet (1,000 mcg total) by mouth daily. 03/15/21   Marrion Coy, MD     Physical Exam:   Vitals:   10/15/21 0416 10/15/21 0754 10/15/21 0807 10/15/21 1103  BP: 135/88  124/79 117/65  Pulse:  85  90 83  Resp: Temp: 97.7 F (36.5 C)  98.3 F (36.8 C)   TempSrc: Oral  Oral   SpO2: 98%  100% 100%  Weight:  61.8 kg    Height:   (1.727 m)       General:  Appears calm and comfortable and is in NAD Cardiovascular:  RRR, no m/r/g.  Respiratory:   CTA bilaterally with no wheezes/rales/rhonchi.  Normal respiratory effort. Abdomen:  soft, mild tenderness to palpation in the epigastric area, ND, NABS, well-healed abdominal incisions from previous lap choley Skin:  no rash or induration seen on limited exam Musculoskeletal:  grossly normal tone BUE/BLE, good ROM, no bony  abnormality Lower extremity:  No LE edema.  Limited foot exam with no ulcerations.  2+ distal pulses. Psychiatric:  grossly normal mood and affect, speech fluent and appropriate, AOx3 Neurologic:  CN 2-12 grossly intact, moves all extremities in coordinated fashion, sensation intact    Data Review:    Radiological Exams on Admission: Independently reviewed - see discussion in A/P where applicable  CT ABDOMEN PELVIS W CONTRAST  Result Date: 10/15/2021 CLINICAL DATA:  Right upper quadrant abdominal pain, nausea and vomiting since midnight. EXAM: CT ABDOMEN AND PELVIS WITH CONTRAST TECHNIQUE: Multidetector CT imaging of the abdomen and pelvis was performed using the standard protocol following bolus administration of intravenous contrast. CONTRAST:  OMNIPAQUE IOHEXOL 300 MG/ML  SOLN COMPARISON:  04/30/2021 CT abdomen.  03/29/2021 CT abdomen/pelvis. FINDINGS: Lower chest: No significant pulmonary nodules or acute consolidative airspace disease. Hepatobiliary: Normal liver size. Hypodense 0.9 cm peripheral right liver lesion, seen to represent a benign hemangioma on 03/29/2021. no new liver lesions. Gallbladder is not discretely visualized and is either collapsed or surgically absent. No biliary ductal dilatation. CBD diameter 4 mm. Pancreas: Prominent peripancreatic fat stranding and ill-defined  fluid surrounding the pancreatic head and neck, compatible with acute pancreatitis. Mixed attenuation 4.6 x 4.4 cm pancreatic head mass (series 5/image 31), mildly decreased from 5.2 x 5.1 cm on 03/29/2021 CT. No new pancreatic masses. No significant pancreatic duct dilation. No measurable peripancreatic fluid collections. Spleen: Normal size. No mass. Adrenals/Urinary Tract: Normal adrenals. Normal kidneys with no hydronephrosis and no renal mass. Normal bladder. Stomach/Bowel: Normal non-distended stomach. Normal caliber small bowel with no small bowel wall thickening. Appendix not discretely visualized. No pericecal inflammatory changes. Normal large bowel with no diverticulosis, large bowel wall thickening or pericolonic fat stranding. Vascular/Lymphatic: Normal caliber abdominal aorta. Patent hepatic, portal, splenic and renal veins. Prominent peripancreatic collateral vessels at the pancreatic head, similar. No pathologically enlarged lymph nodes in the abdomen or pelvis. Reproductive: Grossly normal uterus.  No adnexal mass. Other: No pneumoperitoneum.  No ascites. Musculoskeletal: No aggressive appearing focal osseous lesions. IMPRESSION: 1. Recurrent acute pancreatitis in the pancreatic head. 2. 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 Continued MRI abdomen without and with IV contrast follow-up warranted in 3-6 months. 3. No biliary ductal dilatation. Gallbladder not discretely visualized and is either collapsed or surgically absent. Electronically Signed   By: Delbert Phenix M.D.   On: 10/15/2021 10:35     Labs on Admission: I have personally reviewed the available labs and imaging studies at the time of the admission.  Pertinent labs on Admission: Hemoglobin 15, blood glucose 161, lipase 283     Assessment/Plan:    Intractable nausea, vomiting, abdominal pain secondary to  recurrent acute pancreatitis: This patient will be admitted to the medical/surgical floor under inpatient status.  The patient has been given one 1 L of normal saline bolus in the emergency department.  Start maintenance IV fluids with lactated Ringer's.  Start analgesics, antiemetics as needed. Repeat lipase in AM. Start clear liquid diet and advance as tolerated.  CT imaging notes a chronic hemorrhagic pancreatic pseudocyst that has decreased in size.  ER provider did discuss with GI, Dr. Allegra Lai, and she stated that there is no surgical intervention needed.  No biliary ductal dilatation.  Counseled on alcohol use cessation.  Tobacco dependence: Smokes 1/4 packs/day.  Counseled on tobacco cessation  GERD: Continue home Pepcid and Prilosec  Polyneuropathy:  Continue home gabapentin  Iron deficiency anemia: Holding home ferrous sulfate due to hemoglobin being 15, likely due to hemoconcentration from dehydration.   Other information:    Level of Care: MedSurg DVT prophylaxis: SCDs Code Status: Full code Consults: None Admission status: Inpatient   Verdia Kuba DO [facility]   How to contact the [facility] Attending or Consulting provider 7A - 7P or covering provider during after hours 7P -7A, for this patient?  Check the care team in [facility] and look for a) attending/consulting [facility] provider listed and b) the [facility] team listed Log into [email or web address] and use [place]'s universal password to access. If you do not have the password, please contact the hospital operator. Locate the [facility] provider you are looking for under [facility] and page to a number that you can be directly reached. If you still have difficulty reaching the provider, please page the [facility] (Director on Call) for the Hospitalists listed on amion for assistance.   10/15/2021, 12:25 PM

## 2021-10-15 NOTE — ED Notes (Signed)
Pt alert, laying in bed calmly on her side, resp reg/unlabored, skin dry. Pt denies recent fever. Pt reports history of "necrotic pancreatitis that fixed itself when I went home since they didn't want to do surgery on it; they said it was too risky and that I might bleed-out".

## 2021-10-15 NOTE — ED Triage Notes (Signed)
Pt c/o RUQ abd pain with N/V since midnight. Pt with recent ED visit and diagnosis of gastritis.

## 2021-10-15 NOTE — Plan of Care (Signed)

## 2021-10-16 LAB — CBC
HCT: 38.5 % (ref 36.0–46.0)
Hemoglobin: 12.9 g/dL (ref 12.0–15.0)
MCH: 30.2 pg (ref 26.0–34.0)
MCHC: 33.5 g/dL (ref 30.0–36.0)
MCV: 90.2 fL (ref 80.0–100.0)
Platelets: 203 10*3/uL (ref 150–400)
RBC: 4.27 MIL/uL (ref 3.87–5.11)
RDW: 14.3 % (ref 11.5–15.5)
WBC: 7.2 10*3/uL (ref 4.0–10.5)
nRBC: 0 % (ref 0.0–0.2)

## 2021-10-16 LAB — LIPASE, BLOOD: Lipase: 61 U/L — ABNORMAL HIGH (ref 11–51)

## 2021-10-16 MED ORDER — MORPHINE SULFATE (PF) 2 MG/ML IV SOLN
1.0000 mg | INTRAVENOUS | Status: DC | PRN
  Administered 2021-10-17: 01:00:00 1 mg via INTRAVENOUS
  Filled 2021-10-16: qty 1

## 2021-10-16 MED ORDER — TRAMADOL HCL 50 MG PO TABS
50.0000 mg | ORAL_TABLET | Freq: Four times a day (QID) | ORAL | Status: DC | PRN
  Administered 2021-10-17 – 2021-10-18 (×3): 50 mg via ORAL
  Filled 2021-10-16 (×3): qty 1

## 2021-10-16 NOTE — Progress Notes (Signed)
PROGRESS NOTE   Sandra Pearson [place]  WIO:[id number] DOB: Apr 14, 1986 DOA: 10/15/2021 PCP: Claiborne Rigg, NP  Brief Narrative:  35 year old home dwelling white female gallstone/alcoholic pancreatitis/status postcholecystectomy necrotizing pancreatitis with Reflux Hiatal hernia Complex ovarian cyst in the past Multifactorial anemia Prior H. 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 Pylori Stable at this time continue Pepcid 40 every afternoon   DVT prophylaxis: SCD Code Status: Full Family Communication: None Disposition:  Status is: Inpatient  Remains inpatient appropriate because: Requires inpatient level care    Consultants:  None  Procedures:   Antimicrobials: No   Subjective:  Awake alert coherent no distress asking about eating-pain is moderate and controllable  Objective: Vitals:   10/15/21 1633 10/15/21 2019 10/15/21 2036 10/16/21 0746  BP: 110/69  138/79 130/88  Pulse: 67  79 78  Resp: 16  18 15   Temp: 98.5 F (36.9 C)  98.7 F (37.1 C) 98.5 F (36.9 C)  TempSrc: Oral     SpO2: 100%  100% 100%   Weight:  60 kg    Height:  5\' 8"  (1.727 m)      Intake/Output Summary (Last 24 hours) at 10/16/2021 1516 Last data filed at 10/16/2021 1431 Gross per 24 hour  Intake 1256.35 ml  Output 0 ml  Net 1256.35 ml   Filed Weights   10/15/21 0754 10/15/21 2019  Weight: 61.8 kg 60 kg    Examination:  EOMI NCAT no focal deficit looks fair Chest clear no rales rhonchi Abdomen soft slight tenderness epigastrium ROM intact S1-S2 no murmur Neurologically intact  Data Reviewed: personally reviewed   CBC    Component Value Date/Time   WBC 7.2 10/16/2021 0406   RBC 4.27 10/16/2021 0406   HGB 12.9 10/16/2021 0406   HGB 12.8 05/15/2021 0920   HCT 38.5 10/16/2021 0406   HCT 40.9 05/15/2021 0920   PLT 203 10/16/2021 0406   PLT 236 05/15/2021 0920   MCV 90.2 10/16/2021 0406   MCV 92 05/15/2021 0920   MCH 30.2 10/16/2021 0406   MCHC 33.5 10/16/2021 0406   RDW 14.3 10/16/2021 0406   RDW 14.6 05/15/2021 0920   LYMPHSABS 2.0 07/23/2021 1027   LYMPHSABS 2.1 05/15/2021 0920   MONOABS 0.4 07/23/2021 1027   EOSABS 0.1 07/23/2021 1027   EOSABS 0.3 05/15/2021 0920   BASOSABS 0.0 07/23/2021 1027   BASOSABS 0.0 05/15/2021 0920   CMP Latest Ref Rng & Units 10/15/2021 10/04/2021 09/16/2021  Glucose 70 - 99 mg/dL 14/06/2021) 09/18/2021) 426(S)  BUN 6 - 20 mg/dL 9 11 10   Creatinine 0.44 - 1.00 mg/dL 341(D 622(W  Sodium 135 - 145 mmol/L 137 131(L) 136  Potassium 3.5 - 5.1 mmol/L 4.0 4.1 4.3  Chloride 98 - 111 mmol/L  103 98 101  CO2 22 - 32 mmol/L 25 20(L) 26  Calcium 8.9 - 10.3 mg/dL 9.4 9.4 9.2  Total Protein 6.5 - 8.1 g/dL 7.7 1.6(O) 7.9  Total Bilirubin 0.3 - 1.2 mg/dL 0.8 3.7(G) 0.9  Alkaline Phos 38 - 126 U/L 90 81 81  AST 15 - 41 U/L 30 20 30   ALT 0 - 44 U/L 20 14 18      Radiology Studies: CT ABDOMEN PELVIS W CONTRAST  Result Date: 10/15/2021 CLINICAL DATA:  Right upper quadrant abdominal pain, nausea and vomiting since midnight. EXAM: CT ABDOMEN AND PELVIS WITH CONTRAST TECHNIQUE:  Multidetector CT imaging of the abdomen and pelvis was performed using the standard protocol following bolus administration of intravenous contrast. CONTRAST:  OMNIPAQUE IOHEXOL 300 MG/ML  SOLN COMPARISON:  04/30/2021 CT abdomen.  03/29/2021 CT abdomen/pelvis. FINDINGS: Lower chest: No significant pulmonary nodules or acute consolidative airspace disease. Hepatobiliary: Normal liver size. Hypodense 0.9 cm peripheral right liver lesion, seen to represent a benign hemangioma on 03/29/2021. no new liver lesions. Gallbladder is not discretely visualized and is either collapsed or surgically absent. No biliary ductal dilatation. CBD diameter 4 mm. Pancreas: Prominent peripancreatic fat stranding and ill-defined fluid surrounding the pancreatic head and neck, compatible with acute pancreatitis. Mixed attenuation 4.6 x 4.4 cm pancreatic head mass (series 5/image 31), mildly decreased from 5.2 x 5.1 cm on 03/29/2021 CT. No new pancreatic masses. No significant pancreatic duct dilation. No measurable peripancreatic fluid collections. Spleen: Normal size. No mass. Adrenals/Urinary Tract: Normal adrenals. Normal kidneys with no hydronephrosis and no renal mass. Normal bladder. Stomach/Bowel: Normal non-distended stomach. Normal caliber small bowel with no small bowel wall thickening. Appendix not discretely visualized. No pericecal inflammatory changes. Normal large bowel with no diverticulosis, large bowel wall thickening or pericolonic fat stranding. Vascular/Lymphatic: Normal caliber abdominal aorta. Patent hepatic, portal, splenic and renal veins. Prominent peripancreatic collateral vessels at the pancreatic head, similar. No pathologically enlarged lymph nodes in the abdomen or pelvis. Reproductive: Grossly normal uterus.  No adnexal mass. Other: No pneumoperitoneum.  No ascites. Musculoskeletal: No aggressive appearing focal osseous lesions. IMPRESSION: 1. Recurrent acute pancreatitis in the pancreatic head. 2.  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 Continued MRI abdomen without and with IV contrast follow-up warranted in 3-6 months. 3. No biliary ductal dilatation. Gallbladder not discretely visualized and is either collapsed or surgically absent. Electronically Signed   By: 05/29/2021 M.D.   On: 10/15/2021 10:35     Scheduled Meds:  famotidine  40 mg Oral QPM   folic acid  1 mg Oral Daily   gabapentin  600 mg Oral TID   multivitamin with minerals   Oral Daily   pantoprazole  80 mg Oral Daily   thiamine  100 mg Oral Daily   vitamin B-12  1,000 mcg Oral Daily   Continuous Infusions:  sodium chloride Stopped (10/15/21 2200)   lactated ringers 75 mL/hr at 10/16/21 1227   promethazine (PHENERGAN) injection (IM or IVPB) Stopped (10/15/21 2109)     LOS: 1 day   Time spent: 24  2110, MD [facility] To contact the attending provider between 7A-7P or the covering provider during after hours 7P-7A, please log into the web site [email or web address] and access using universal [place] password for that web site. If you do not have the password, please call the hospital operator.  10/16/2021, 3:16 PM

## 2021-10-16 NOTE — Hospital Course (Signed)
35 year old home dwelling white female gallstone/alcoholic pancreatitis/status postcholecystectomy necrotizing pancreatitis with Reflux Hiatal hernia Complex ovarian cyst in the past Multifactorial anemia Prior H. Pylori  Emergency room visit 10/04/2021 gastritis discharged home--had several alcoholic drinks 12/18 Developed RUQ abdominal pain + N/V CT scan showed acute pancreatitis decreased size of known chronic hemorrhagic pancreatitis  Lipase 283 trending down to 61 BUN/creatinine/0.5  WBC 7.2, 12.9

## 2021-10-16 NOTE — TOC Initial Note (Signed)
Transition of Care [facility]) - Initial/Assessment Note    Patient Details  Name: Sandra Pearson MRN: [id number] Date of Birth: 03-24-1986  Transition of Care [facility]) CM/SW Contact:    Marlowe Sax, RN Phone Number: 10/16/2021, 9:20 AM  Clinical Narrative:                  Transition of Care [facility]) Screening Note   Patient Details  Name: Sandra Pearson Date of Birth: 07-22-1986   Transition of Care [facility]) CM/SW Contact:    Marlowe Sax, RN Phone Number: 10/16/2021, 9:21 AM    Transition of Care Department [facility]) has reviewed patient and no TOC needs have been identified at this time. We will continue to monitor patient advancement through interdisciplinary progression rounds. If new patient transition needs arise, please place a TOC consult.          Patient Goals and CMS Choice        Expected Discharge Plan and Services                                                Prior Living Arrangements/Services                       Activities of Daily Living Home Assistive Devices/Equipment: None ADL Screening (condition at time of admission) Patient's cognitive ability adequate to safely complete daily activities?: Yes Is the patient deaf or have difficulty hearing?: No Does the patient have difficulty seeing, even when wearing glasses/contacts?: No Does the patient have difficulty concentrating, remembering, or making decisions?: No Patient able to express need for assistance with ADLs?: Yes Does the patient have difficulty dressing or bathing?: No Independently performs ADLs?: Yes (appropriate for developmental age) Does the patient have difficulty walking or climbing stairs?: No Weakness of Legs: None Weakness of Arms/Hands: None  Permission Sought/Granted                  Emotional Assessment              Admission diagnosis:  Alcohol-induced acute pancreatitis, unspecified complication status  [K85.20] Acute pancreatitis, unspecified complication status, unspecified pancreatitis type [K85.90] Patient Active Problem List   Diagnosis Date Noted   Acute pancreatitis, unspecified complication status, unspecified pancreatitis type 10/15/2021   Gallstones 09/23/2021   Gallstone pancreatitis    Acute pancreatitis 03/29/2021   Normocytic anemia 03/29/2021   GERD with esophagitis 03/29/2021   H. pylori infection 03/29/2021   Chest pain 03/29/2021   Severe acute pancreatitis 03/29/2021   Hypertension    Atypical chest pain    Iron deficiency anemia    Gastric erythema    Hiatal hernia    Acute esophagitis    Acute anemia 03/13/2021   Severe anemia 03/12/2021   Hypokalemia 03/12/2021   Hypomagnesemia 03/12/2021   Complex ovarian cyst 11/22/2018   Nausea and vomiting in adult 11/22/2018   PCP:  Claiborne Rigg, NP Pharmacy:   [facility] [address] [place] [address] Phone: [phone] Fax: [phone]  [id number] - [place], [place] - [address] [place] [address] Phone: [phone] Fax: [phone]     Social Determinants of Health (SDOH) Interventions    Readmission Risk Interventions No flowsheet data found.

## 2021-10-17 LAB — VITAMIN B12: Vitamin B-12: 1500 pg/mL — ABNORMAL HIGH (ref 180–914)

## 2021-10-17 LAB — FOLATE: Folate: 37.8 ng/mL (ref >5.9)

## 2021-10-17 LAB — GLUCOSE, CAPILLARY: Glucose-Capillary: 137 mg/dL — ABNORMAL HIGH (ref 70–99)

## 2021-10-17 MED ORDER — MORPHINE SULFATE (PF) 2 MG/ML IV SOLN
2.0000 mg | INTRAVENOUS | Status: DC | PRN
  Administered 2021-10-17 (×4): 2 mg via INTRAVENOUS
  Filled 2021-10-17 (×4): qty 1

## 2021-10-17 MED ORDER — ENOXAPARIN SODIUM 40 MG/0.4ML IJ SOSY
40.0000 mg | PREFILLED_SYRINGE | INTRAMUSCULAR | Status: DC
  Administered 2021-10-17: 22:00:00 40 mg via SUBCUTANEOUS
  Filled 2021-10-17: qty 0.4

## 2021-10-17 MED ORDER — HYDROMORPHONE HCL 1 MG/ML IJ SOLN
0.5000 mg | Freq: Once | INTRAMUSCULAR | Status: AC
  Administered 2021-10-17: 05:00:00 0.5 mg via INTRAVENOUS
  Filled 2021-10-17: qty 1

## 2021-10-17 MED ORDER — FENTANYL CITRATE PF 50 MCG/ML IJ SOSY
12.5000 ug | PREFILLED_SYRINGE | Freq: Once | INTRAMUSCULAR | Status: AC
  Administered 2021-10-17: 03:00:00 12.5 ug via INTRAVENOUS
  Filled 2021-10-17: qty 1

## 2021-10-17 NOTE — Progress Notes (Signed)
PROGRESS NOTE   Sandra Pearson  BOF:[id number] DOB: Jan 26, 1986 DOA: 10/15/2021 PCP: Claiborne Rigg, NP  Brief Narrative:  34 year old home dwelling white female gallstone/alcoholic pancreatitis/status postcholecystectomy necrotizing pancreatitis with Reflux Hiatal hernia Complex ovarian cyst in the past Multifactorial anemia Prior H. 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 Pylori Stable at this time continue Pepcid 40 every afternoon   DVT prophylaxis: SCD Code Status: Full Family Communication: None Disposition:  Status is: Inpatient  Remains inpatient appropriate because: Requires inpatient level care    Consultants:  None  Procedures:   Antimicrobials: No   Subjective:  Severe pain overnight after eating ham sandwich rice milk and chocolate chip cookie  asking about pain meds Has backed down on her own clears No vomiting  since last night  Objective: Vitals:   10/16/21 0746 10/16/21 2140 10/17/21 0322 10/17/21 0807  BP: 130/88 115/71 (!) 147/82 (!) 146/80  Pulse: 78 71 68 75  Resp: 15 16 18 17   Temp: 98.5 F (36.9  C) 97.9 F (36.6 C) (!) 97.4 F (36.3 C) 98 F (36.7 C)  TempSrc:      SpO2: 100% 98% 100% 97%  Weight:      Height:        Intake/Output Summary (Last 24 hours) at 10/17/2021 1104 Last data filed at 10/16/2021 1907 Gross per 24 hour  Intake 600 ml  Output --  Net 600 ml    Filed Weights   10/15/21 0754 10/15/21 2019  Weight: 61.8 kg 60 kg    Examination:   coherent no distress Chest clear no rales rhonchi Abdomen quite tender in epigastrium no rebound ROM intact S1-S2 no murmur Neurologically intact  Data Reviewed: personally reviewed   CBC    Component Value Date/Time   WBC 7.2 10/16/2021 0406   RBC 4.27 10/16/2021 0406   HGB 12.9 10/16/2021 0406   HGB 12.8 05/15/2021 0920   HCT 38.5 10/16/2021 0406   HCT 40.9 05/15/2021 0920   PLT 203 10/16/2021 0406   PLT 236 05/15/2021 0920   MCV 90.2 10/16/2021 0406   MCV 92 05/15/2021 0920   MCH 30.2 10/16/2021 0406   MCHC 33.5 10/16/2021 0406   RDW 14.3 10/16/2021 0406   RDW 14.6 05/15/2021 0920   LYMPHSABS 2.0 07/23/2021 1027   LYMPHSABS 2.1 05/15/2021 0920   MONOABS 0.4 07/23/2021 1027   EOSABS 0.1 07/23/2021 1027   EOSABS 0.3 05/15/2021 0920   BASOSABS 0.0 07/23/2021 1027   BASOSABS 0.0 05/15/2021 0920   CMP Latest Ref Rng & Units 10/15/2021 10/04/2021 09/16/2021  Glucose 70 - 99 mg/dL 09/18/2021) 778(E) 423(N)  BUN 6 - 20 mg/dL 9 11 10   Creatinine 0.44 - 1.00 mg/dL 361(W 4.31  Sodium  135 - 145 mmol/L 137 131(L) 136  Potassium 3.5 - 5.1 mmol/L 4.0 4.1 4.3  Chloride 98 - 111 mmol/L 103 98 101  CO2 22 - 32 mmol/L 25 20(L) 26  Calcium 8.9 - 10.3 mg/dL 9.4 9.4 9.2  Total Protein 6.5 - 8.1 g/dL 7.7 0.0(P) 7.9  Total Bilirubin 0.3 - 1.2 mg/dL 0.8 2.3(R) 0.9  Alkaline Phos 38 - 126 U/L 90 81 81  AST 15 - 41 U/L 30 20 30   ALT 0 - 44 U/L 20 14 18      Radiology Studies: No results found.   Scheduled Meds:  famotidine  40 mg Oral QPM   folic acid  1 mg Oral Daily   gabapentin  600 mg Oral TID    multivitamin with minerals   Oral Daily   pantoprazole  80 mg Oral Daily   thiamine  100 mg Oral Daily   vitamin B-12  1,000 mcg Oral Daily   Continuous Infusions:  sodium chloride Stopped (10/15/21 2200)   lactated ringers 75 mL/hr at 10/17/21 0131   promethazine (PHENERGAN) injection (IM or IVPB) Stopped (10/15/21 2109)     LOS: 2 days   Time spent: 58  2110, MD [facility] To contact the attending provider between 7A-7P or the covering provider during after hours 7P-7A, please log into the web site [email or web address] and access using universal [place] password for that web site. If you do not have the password, please call the hospital operator.  10/17/2021, 11:04 AM

## 2021-10-17 NOTE — Plan of Care (Signed)
Patient reports worsening abdominal pain, pain radiates to back. She also reports feeling "hot". B.Morrison, NP made aware. See new pain medication orders. Blood sugar checked. Pain medications not effective. Jon Billings, NP notified. No additional orders.  CARE PLAN ONGOING Problem: Pain Managment: Goal: General experience of comfort will improve Outcome: Not Progressing   Problem: Education: Goal: Knowledge of General Education information will improve Description: Including pain rating scale, medication(s)/side effects and non-pharmacologic comfort measures Outcome: Progressing   Problem: Health Behavior/Discharge Planning: Goal: Ability to manage health-related needs will improve Outcome: Progressing   Problem: Clinical Measurements: Goal: Ability to maintain clinical measurements within normal limits will improve Outcome: Progressing Goal: Will remain free from infection Outcome: Progressing Goal: Diagnostic test results will improve Outcome: Progressing Goal: Respiratory complications will improve Outcome: Progressing Goal: Cardiovascular complication will be avoided Outcome: Progressing   Problem: Activity: Goal: Risk for activity intolerance will decrease Outcome: Progressing   Problem: Nutrition: Goal: Adequate nutrition will be maintained Outcome: Progressing   Problem: Coping: Goal: Level of anxiety will decrease Outcome: Progressing   Problem: Elimination: Goal: Will not experience complications related to bowel motility Outcome: Progressing Goal: Will not experience complications related to urinary retention Outcome: Progressing   Problem: Safety: Goal: Ability to remain free from injury will improve Outcome: Progressing   Problem: Skin Integrity: Goal: Risk for impaired skin integrity will decrease Outcome: Progressing   Problem: Education: Goal: Knowledge of Pancreatitis treatment and prevention will improve Outcome: Progressing   Problem: Health  Behavior/Discharge Planning: Goal: Ability to formulate a plan to maintain an alcohol-free life will improve Outcome: Progressing   Problem: Nutritional: Goal: Ability to achieve adequate nutritional intake will improve Outcome: Progressing   Problem: Clinical Measurements: Goal: Complications related to the disease process, condition or treatment will be avoided or minimized Outcome: Progressing

## 2021-10-17 NOTE — Progress Notes (Signed)
Cross Cover Morphine and tramadol ineffective for managing pain. One dose of fentanyl ordered

## 2021-10-18 LAB — COMPREHENSIVE METABOLIC PANEL
ALT: 13 U/L (ref 0–44)
AST: 16 U/L (ref 15–41)
Albumin: 3.3 g/dL — ABNORMAL LOW (ref 3.5–5.0)
Alkaline Phosphatase: 76 U/L (ref 38–126)
Anion gap: 6 (ref 5–15)
BUN: <5 mg/dL — ABNORMAL LOW (ref 6–20)
CO2: 27 mmol/L (ref 22–32)
Calcium: 8.8 mg/dL — ABNORMAL LOW (ref 8.9–10.3)
Chloride: 101 mmol/L (ref 98–111)
Creatinine, Ser: 0.56 mg/dL (ref 0.44–1.00)
GFR, Estimated: >60 mL/min (ref >60)
Glucose, Bld: 134 mg/dL — ABNORMAL HIGH (ref 70–99)
Potassium: 3.7 mmol/L (ref 3.5–5.1)
Sodium: 134 mmol/L — ABNORMAL LOW (ref 135–145)
Total Bilirubin: 0.9 mg/dL (ref 0.3–1.2)
Total Protein: 6.8 g/dL (ref 6.5–8.1)

## 2021-10-18 MED ORDER — TRAMADOL HCL 50 MG PO TABS: 50.0000 mg | ORAL_TABLET | Freq: Four times a day (QID) | ORAL | 0 refills | Status: DC | PRN

## 2021-10-18 NOTE — Discharge Summary (Signed)
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 Pylori Stable at this time continue Pepcid 40 every afternoon    Discharge Exam: Vitals:   10/18/21 0600 10/18/21 0818  BP: 126/83 (!) 134/96  Pulse: 88 85  Resp: 18 16  Temp: 99.1 F (37.3 C) 98.6 F (37 C)  SpO2: 96% 99%    Subj on day of d/c   Awake coherent no distress Some discomfort in abdomen otherwise is fair  General Exam on discharge  EOMI NCAT no focal deficit CTA B no added sound rales rhonchi Mild epigastric tenderness No lower extremity edema S1-S2 no murmur Neurologically intact  Discharge Instructions   Discharge Instructions     Diet - low sodium heart healthy   Complete by: As directed    Discharge instructions   Complete by: As directed    Please eat a soft diet and go slowly on the diet for several days prior to going back to a full diet Please desist from using alcohol Please follow-up in the outpatient setting with your regular stomach doctor and primary physician   Increase activity slowly   Complete by: As directed       Allergies as of 10/18/2021   No Known Allergies      Medication List     TAKE these medications    acetaminophen 500 MG tablet Commonly known as: TYLENOL Take 2 tablets (1,000 mg total) by mouth every 6 (six) hours as needed for mild pain.   aluminum-magnesium hydroxide-simethicone 200-200-20 MG/5ML Susp Commonly known as: MAALOX Take 30 mLs by mouth 4 (four) times  daily -  before meals and at bedtime.   famotidine 40 MG tablet Commonly known as: PEPCID Take 1 tablet (40 mg total) by mouth every evening.   ferrous sulfate 325 (65 FE) MG tablet Take 1 tablet (325 mg total) by mouth daily.   folic acid 1 MG tablet Commonly known as: FOLVITE Take 1 tablet (1 mg total) by  mouth daily.   gabapentin 300 MG capsule Commonly known as: NEURONTIN Take 2 capsules (600 mg total) by mouth 3 (three) times daily.   omeprazole 40 MG capsule Commonly known as: PRILOSEC Take 1 capsule (40 mg total) by mouth daily.   ondansetron 4 MG disintegrating tablet Commonly known as: Zofran ODT Take 1 tablet (4 mg total) by mouth every 8 (eight) hours as needed for nausea or vomiting.   thiamine 100 MG tablet Take 1 tablet (100 mg total) by mouth daily.   traMADol 50 MG tablet Commonly known as: ULTRAM Take 1 tablet (50 mg total) by mouth every 6 (six) hours as needed for moderate pain.   vitamin B-12 1000 MCG tablet Commonly known as: CYANOCOBALAMIN Take 1 tablet (1,000 mcg total) by mouth daily.   WOMENS 50+ MULTI VITAMIN/MIN PO Take 1 tablet by mouth daily.       No Known Allergies    The results of significant diagnostics from this hospitalization (including imaging, microbiology, ancillary and laboratory) are listed below for reference.    Significant Diagnostic Studies: CT ABDOMEN PELVIS W CONTRAST  Result Date: 10/15/2021 CLINICAL DATA:  Right upper quadrant abdominal pain, nausea and vomiting since midnight. EXAM: CT ABDOMEN AND PELVIS WITH CONTRAST TECHNIQUE: Multidetector CT imaging of the abdomen and pelvis was performed using the standard protocol following bolus administration of intravenous contrast. CONTRAST:  OMNIPAQUE IOHEXOL 300 MG/ML  SOLN COMPARISON:  04/30/2021 CT abdomen.  03/29/2021 CT abdomen/pelvis. FINDINGS: Lower chest: No significant pulmonary nodules or acute consolidative airspace disease. Hepatobiliary: Normal liver size. Hypodense 0.9 cm peripheral right liver lesion, seen to represent a benign hemangioma on 03/29/2021. no new liver lesions. Gallbladder is not discretely visualized and is either collapsed or surgically absent. No biliary ductal dilatation. CBD diameter 4 mm. Pancreas: Prominent peripancreatic fat stranding and  ill-defined fluid surrounding the pancreatic head and neck, compatible with acute pancreatitis. Mixed attenuation 4.6 x 4.4 cm pancreatic head mass (series 5/image 31), mildly decreased from 5.2 x 5.1 cm on 03/29/2021 CT. No new pancreatic masses. No significant pancreatic duct dilation. No measurable peripancreatic fluid collections. Spleen: Normal size. No mass. Adrenals/Urinary Tract: Normal adrenals. Normal kidneys with no hydronephrosis and no renal mass. Normal bladder. Stomach/Bowel: Normal non-distended stomach. Normal caliber small bowel with no small bowel wall thickening. Appendix not discretely visualized. No pericecal inflammatory changes. Normal large bowel with no diverticulosis, large bowel wall thickening or pericolonic fat stranding. Vascular/Lymphatic: Normal caliber abdominal aorta. Patent hepatic, portal, splenic and renal veins. Prominent peripancreatic collateral vessels at the pancreatic head, similar. No pathologically enlarged lymph nodes in the abdomen or pelvis. Reproductive: Grossly normal uterus.  No adnexal mass. Other: No pneumoperitoneum.  No ascites. Musculoskeletal: No aggressive appearing focal osseous lesions. IMPRESSION: 1. Recurrent acute pancreatitis in the pancreatic head. 2. 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 Continued MRI abdomen without and with IV contrast follow-up warranted in 3-6 months. 3. No biliary ductal dilatation. Gallbladder not discretely visualized and is either collapsed or surgically absent. Electronically Signed  By: Delbert Phenix M.D.   On: 10/15/2021 10:35   US Abdomen Limited RUQ (LIVER/GB)  Result Date: 10/04/2021 CLINICAL DATA:  Right upper quadrant pain, cholecystectomy EXAM: ULTRASOUND ABDOMEN LIMITED RIGHT UPPER QUADRANT COMPARISON:  Right upper quadrant ultrasound, 06/24/2021, CT abdomen, 04/30/2021 FINDINGS: Gallbladder:  Status post cholecystectomy. Common bile duct: Diameter: 8 mm Liver: No focal lesion identified. Heterogeneously increased parenchymal echogenicity. Portal vein is patent on color Doppler imaging with normal direction of blood flow towards the liver. Other: Cystic lesion within the pancreatic head measuring 3.9 x 3.4 x 3.9 cm. IMPRESSION: 1. Status post cholecystectomy. 2. Mild enlargement of the common bile duct up to 8 mm, likely postoperative biliary ductal dilatation. 3. Hepatic steatosis 4. Cystic lesion within the pancreatic head measuring up to 3.9 cm, likely a pseudocyst and as seen on prior CT. Electronically Signed   By: Jearld Lesch M.D.   On: 10/04/2021 11:52    Microbiology: Recent Results (from the past 240 hour(s))  Resp Panel by RT-PCR (Flu A&B, Covid) Nasopharyngeal Swab     Status: None   Collection Time: 10/15/21  2:37 PM   Specimen: Nasopharyngeal Swab; Nasopharyngeal(NP) swabs in vial transport medium  Result Value Ref Range Status   SARS Coronavirus 2 by RT PCR NEGATIVE NEGATIVE Final    Comment: (NOTE) SARS-CoV-2 target nucleic acids are NOT DETECTED.  The SARS-CoV-2 RNA is generally detectable in upper respiratory specimens during the acute phase of infection. The lowest concentration of SARS-CoV-2 viral copies this assay can detect is 138 copies/mL. A negative result does not preclude SARS-Cov-2 infection and should not be used as the sole basis for treatment or other patient management decisions. A negative result may occur with  improper specimen collection/handling, submission of specimen other than nasopharyngeal swab, presence of viral mutation(s) within the areas targeted by this assay, and inadequate number of viral copies(<138 copies/mL). A negative result must be combined with clinical observations, patient history, and epidemiological information. The expected result is Negative.  Fact Sheet for Patients:  BloggerCourse.com  Fact  Sheet for Healthcare Providers:  SeriousBroker.it  This test is no t yet approved or cleared by the [place] FDA and  has been authorized for detection and/or diagnosis of SARS-CoV-2 by FDA under an Emergency Use Authorization (EUA). This EUA will remain  in effect (meaning this test can be used) for the duration of the COVID-19 declaration under Section 564(b)(1) of the Act, 21 U.S.C.section 360bbb-3(b)(1), unless the authorization is terminated  or revoked sooner.       Influenza A by PCR NEGATIVE NEGATIVE Final   Influenza B by PCR NEGATIVE NEGATIVE Final    Comment: (NOTE) The Xpert Xpress SARS-CoV-2/FLU/RSV plus assay is intended as an aid in the diagnosis of influenza from Nasopharyngeal swab specimens and should not be used as a sole basis for treatment. Nasal washings and aspirates are unacceptable for Xpert Xpress SARS-CoV-2/FLU/RSV testing.  Fact Sheet for Patients: BloggerCourse.com  Fact Sheet for Healthcare Providers: SeriousBroker.it  This test is not yet approved or cleared by the [place] FDA and has been authorized for detection and/or diagnosis of SARS-CoV-2 by FDA under an Emergency Use Authorization (EUA). This EUA will remain in effect (meaning this test can be used) for the duration of the COVID-19 declaration under Section 564(b)(1) of the Act, 21 U.S.C. section 360bbb-3(b)(1), unless the authorization is terminated or revoked.  Performed at [facility], [address], [place], [place] [address]      Labs: Basic  Metabolic Panel: Recent Labs  Lab 10/15/21 0418 10/18/21 0439  NA 137 134*  K 4.0 3.7  CL 103 101  CO2 25 27  GLUCOSE 161* 134*  BUN 9 <5*  CREATININE 0.59 0.56  CALCIUM 9.4 8.8*   Liver Function Tests: Recent Labs  Lab 10/15/21 0418 10/18/21 0439  AST 30 16  ALT 20 13  ALKPHOS 90 76  BILITOT 0.8 0.9  PROT 7.7 6.8  ALBUMIN 4.2  3.3*   Recent Labs  Lab 10/15/21 0418 10/16/21 0406  LIPASE 283* 61*   No results for input(s): AMMONIA in the last 168 hours. CBC: Recent Labs  Lab 10/15/21 0418 10/16/21 0406  WBC 10.4 7.2  HGB 15.2* 12.9  HCT 45.5 38.5  MCV 91.0 90.2  PLT 324 203   Cardiac Enzymes: No results for input(s): CKTOTAL, CKMB, CKMBINDEX, TROPONINI in the last 168 hours. BNP: BNP (last 3 results) No results for input(s): BNP in the last 8760 hours.  ProBNP (last 3 results) No results for input(s): PROBNP in the last 8760 hours.  CBG: Recent Labs  Lab 10/15/21 2108 10/17/21 0326  GLUCAP 85 137*       Signed:  Rhetta Mura MD   [facility] 10/18/2021, 11:22 AM

## 2021-10-18 NOTE — Plan of Care (Signed)
Patient sleeping between care. Pain better controlled overnight. Intermittent nausea. No new changes in assessment. Plan of care reviewed with patient. Call bell within reach.  PLAN OF CARE ONGOING Problem: Education: Goal: Knowledge of General Education information will improve Description: Including pain rating scale, medication(s)/side effects and non-pharmacologic comfort measures Outcome: Progressing   Problem: Health Behavior/Discharge Planning: Goal: Ability to manage health-related needs will improve Outcome: Progressing   Problem: Clinical Measurements: Goal: Ability to maintain clinical measurements within normal limits will improve Outcome: Progressing Goal: Will remain free from infection Outcome: Progressing Goal: Diagnostic test results will improve Outcome: Progressing Goal: Respiratory complications will improve Outcome: Progressing Goal: Cardiovascular complication will be avoided Outcome: Progressing   Problem: Activity: Goal: Risk for activity intolerance will decrease Outcome: Progressing   Problem: Nutrition: Goal: Adequate nutrition will be maintained Outcome: Progressing   Problem: Coping: Goal: Level of anxiety will decrease Outcome: Progressing   Problem: Elimination: Goal: Will not experience complications related to bowel motility Outcome: Progressing Goal: Will not experience complications related to urinary retention Outcome: Progressing   Problem: Pain Managment: Goal: General experience of comfort will improve Outcome: Progressing   Problem: Safety: Goal: Ability to remain free from injury will improve Outcome: Progressing   Problem: Skin Integrity: Goal: Risk for impaired skin integrity will decrease Outcome: Progressing   Problem: Education: Goal: Knowledge of Pancreatitis treatment and prevention will improve Outcome: Progressing   Problem: Health Behavior/Discharge Planning: Goal: Ability to formulate a plan to maintain an  alcohol-free life will improve Outcome: Progressing   Problem: Nutritional: Goal: Ability to achieve adequate nutritional intake will improve Outcome: Progressing   Problem: Clinical Measurements: Goal: Complications related to the disease process, condition or treatment will be avoided or minimized Outcome: Progressing

## 2021-10-20 LAB — METHYLMALONIC ACID, SERUM: Methylmalonic Acid, Quantitative: 317 nmol/L (ref 0–378)

## 2021-10-22 ENCOUNTER — Telehealth: Payer: Self-pay

## 2021-10-22 NOTE — Telephone Encounter (Signed)
Transition Care Management Follow-up Telephone Call Date of discharge and from where: 10/18/2021, [facility] How have you been since you were released from the hospital? She said she is feeling much better. Any questions or concerns? Yes - she said she was supposed to have a medication that was ordered from her prior hospitalization but she was not able to obtain it. She explained that she could not remember the name of the med but said that she was told it was important to take. She said that the pharmacy did not have the medication but was going to order it in liquid form but she never received it.  This CM reviewed her medication list with her and explained that all of the medications ordered are pills.  As per her AVS from ED 10/04/2021, sucralfate was ordered but it is not on her current AVS.  She still was not sure that sucralfate is the medication she should have been taking. This CM explained to her that it is not on her AVS at this time. She was still concerned that she should be taking it. Explained to her that this CM will send a message to her PCP with this concern.    Items Reviewed: Did the pt receive and understand the discharge instructions provided? Yes  Medications obtained and verified? Yes  - Med list reviewed and she said she has all medications on her list. Question about med noted above.  Other? No  Any new allergies since your discharge? No  Dietary orders reviewed? Yes Do you have support at home? Yes , her husband  Home Care and Equipment/Supplies: Were home health services ordered? no If so, what is the name of the agency? N/a  Has the agency set up a time to come to the patient's home? not applicable Were any new equipment or medical supplies ordered?  No What is the name of the medical supply agency? N/a Were you able to get the supplies/equipment? not applicable Do you have any questions related to the use of the equipment or supplies?  No  Functional Questionnaire: (I = Independent and D = Dependent) ADLs: independent   Follow up appointments reviewed:  PCP Hospital f/u appt confirmed? Yes  Scheduled to see Bertram Denver, NP - 01/17/2022. She did not want to schedule an appointment to be seen sooner. Specialist Hospital f/u appt confirmed? No  , none scheduled at this time  Are transportation arrangements needed? No  If their condition worsens, is the pt aware to call PCP or go to the Emergency Dept.? yes Was the patient provided with contact information for the PCP's office or ED? Yes Was to pt encouraged to call back with questions or concerns? Yes

## 2021-10-22 NOTE — Telephone Encounter (Signed)
It was not ordered on her last DC summary. If it was not ordered she does not need to take it.

## 2021-11-05 ENCOUNTER — Other Ambulatory Visit: Payer: Self-pay | Admitting: Emergency Medicine

## 2021-11-05 ENCOUNTER — Other Ambulatory Visit: Payer: Self-pay | Admitting: Nurse Practitioner

## 2021-11-05 ENCOUNTER — Encounter: Payer: Self-pay | Admitting: Oncology

## 2021-11-05 ENCOUNTER — Other Ambulatory Visit: Payer: Self-pay

## 2021-11-05 NOTE — Telephone Encounter (Signed)
Requested medication (s) are due for refill today: yes  Requested medication (s) are on the active medication list: yes  Last refill:  09/16/21  Future visit scheduled: yes  Notes to clinic:  Unable to refill per protocol, last refill by another provider.      Requested Prescriptions  Pending Prescriptions Disp Refills   ondansetron (ZOFRAN) 4 MG tablet 30 tablet 1    Sig: Take 1 tablet (4 mg total) by mouth daily as needed for nausea or vomiting.     Not Delegated - Gastroenterology: Antiemetics Failed - 11/05/2021 11:54 AM      Failed - This refill cannot be delegated      Passed - Valid encounter within last 6 months    Recent Outpatient Visits           1 month ago Hospital discharge follow-up   [facility] [place], [place] W, NP   3 months ago Polyneuropathy associated with underlying disease [facility])   [place] Claiborne Rigg, NP   4 months ago Encounter for Papanicolaou smear for cervical cancer screening   [facility] [place], Shea Stakes, NP   5 months ago Encounter to establish care   [facility] [place], Shea Stakes, NP       Future Appointments             In 2 months Claiborne Rigg, NP [employer] [facility]

## 2021-11-06 ENCOUNTER — Other Ambulatory Visit: Payer: Self-pay

## 2021-11-06 ENCOUNTER — Encounter: Payer: Self-pay | Admitting: Oncology

## 2021-11-07 ENCOUNTER — Other Ambulatory Visit: Payer: Self-pay

## 2021-11-08 ENCOUNTER — Other Ambulatory Visit: Payer: Self-pay

## 2021-11-08 ENCOUNTER — Encounter: Payer: Self-pay | Admitting: Oncology

## 2021-11-08 MED ORDER — ONDANSETRON HCL 4 MG PO TABS
4.0000 mg | ORAL_TABLET | Freq: Every day | ORAL | 1 refills | Status: DC | PRN
Start: 2021-11-08 — End: 2022-01-17
  Filled 2021-11-08: qty 30, 30d supply, fill #0
  Filled 2021-12-13: qty 30, 30d supply, fill #1

## 2021-11-25 ENCOUNTER — Other Ambulatory Visit: Payer: Self-pay

## 2021-11-25 ENCOUNTER — Encounter: Payer: Self-pay | Admitting: Oncology

## 2021-12-10 ENCOUNTER — Other Ambulatory Visit: Payer: Self-pay | Admitting: Nurse Practitioner

## 2021-12-10 DIAGNOSIS — G63 Polyneuropathy in diseases classified elsewhere: Secondary | ICD-10-CM

## 2021-12-11 MED ORDER — GABAPENTIN 300 MG PO CAPS
600.0000 mg | ORAL_CAPSULE | Freq: Three times a day (TID) | ORAL | 3 refills | Status: DC
  Filled 2021-12-11: qty 180, 30d supply, fill #0
  Filled 2022-01-08: qty 180, 30d supply, fill #1

## 2021-12-11 NOTE — Telephone Encounter (Signed)
Requested medications are due for refill today.  yes  Requested medications are on the active medications list.  yes  Last refill. 08/06/2021 #180 3 refills  Future visit scheduled.   yes  Notes to clinic.  Rx expired 12/07/2021.    Requested Prescriptions  Pending Prescriptions Disp Refills   gabapentin (NEURONTIN) 300 MG capsule 180 capsule 3    Sig: Take 2 capsules (600 mg total) by mouth 3 (three) times daily.     Neurology: Anticonvulsants - gabapentin Passed - 12/10/2021  6:34 PM      Passed - Cr in normal range and within 360 days    Creatinine, Ser  Date Value Ref Range Status  10/18/2021 0.56 0.44 - 1.00 mg/dL Final          Passed - Completed PHQ-2 or PHQ-9 in the last 360 days      Passed - Valid encounter within last 12 months    Recent Outpatient Visits           2 months ago Hospital discharge follow-up   [facility] [place], [place] W, NP   4 months ago Polyneuropathy associated with underlying disease [facility])   [facility] [place], Shea Stakes, NP   5 months ago Encounter for Papanicolaou smear for cervical cancer screening   [facility] [place], Shea Stakes, NP   7 months ago Encounter to establish care   [facility] [place], Shea Stakes, NP       Future Appointments             In 1 month Claiborne Rigg, NP [facility] [facility]

## 2021-12-12 ENCOUNTER — Encounter: Payer: Self-pay | Admitting: Oncology

## 2021-12-12 ENCOUNTER — Other Ambulatory Visit: Payer: Self-pay

## 2021-12-13 ENCOUNTER — Encounter: Payer: Self-pay | Admitting: Oncology

## 2021-12-13 ENCOUNTER — Other Ambulatory Visit: Payer: Self-pay

## 2021-12-17 ENCOUNTER — Other Ambulatory Visit: Payer: Self-pay

## 2022-01-09 ENCOUNTER — Encounter: Payer: Self-pay | Admitting: Oncology

## 2022-01-09 ENCOUNTER — Other Ambulatory Visit: Payer: Self-pay

## 2022-01-10 ENCOUNTER — Encounter: Payer: Self-pay | Admitting: Oncology

## 2022-01-10 ENCOUNTER — Other Ambulatory Visit: Payer: Self-pay

## 2022-01-17 ENCOUNTER — Other Ambulatory Visit: Payer: Self-pay

## 2022-01-17 ENCOUNTER — Ambulatory Visit: Payer: 59 | Attending: Nurse Practitioner | Admitting: Nurse Practitioner

## 2022-01-17 ENCOUNTER — Ambulatory Visit: Payer: Self-pay

## 2022-01-17 ENCOUNTER — Encounter: Payer: Self-pay | Admitting: Nurse Practitioner

## 2022-01-17 ENCOUNTER — Other Ambulatory Visit: Payer: Self-pay | Admitting: Nurse Practitioner

## 2022-01-17 ENCOUNTER — Telehealth: Payer: 59 | Admitting: Nurse Practitioner

## 2022-01-17 ENCOUNTER — Encounter: Payer: Self-pay | Admitting: Oncology

## 2022-01-17 VITALS — BP 124/86 | HR 79 | Wt 140.4 lb

## 2022-01-17 DIAGNOSIS — G629 Polyneuropathy, unspecified: Secondary | ICD-10-CM

## 2022-01-17 DIAGNOSIS — M419 Scoliosis, unspecified: Secondary | ICD-10-CM

## 2022-01-17 DIAGNOSIS — K2100 Gastro-esophageal reflux disease with esophagitis, without bleeding: Secondary | ICD-10-CM

## 2022-01-17 DIAGNOSIS — F1020 Alcohol dependence, uncomplicated: Secondary | ICD-10-CM

## 2022-01-17 DIAGNOSIS — D509 Iron deficiency anemia, unspecified: Secondary | ICD-10-CM

## 2022-01-17 DIAGNOSIS — E876 Hypokalemia: Secondary | ICD-10-CM

## 2022-01-17 DIAGNOSIS — R748 Abnormal levels of other serum enzymes: Secondary | ICD-10-CM

## 2022-01-17 DIAGNOSIS — D72829 Elevated white blood cell count, unspecified: Secondary | ICD-10-CM

## 2022-01-17 MED ORDER — FOLIC ACID 1 MG PO TABS
1.0000 mg | ORAL_TABLET | Freq: Every day | ORAL | 3 refills | Status: DC
  Filled 2022-01-17: qty 90, 90d supply, fill #0
  Filled 2022-04-15: qty 90, 90d supply, fill #1
  Filled 2022-07-12 – 2022-07-21 (×2): qty 30, 30d supply, fill #2
  Filled 2022-12-03: qty 30, 30d supply, fill #3
  Filled 2022-12-30: qty 30, 30d supply, fill #4

## 2022-01-17 MED ORDER — FERROUS SULFATE 325 (65 FE) MG PO TABS
325.0000 mg | ORAL_TABLET | Freq: Every day | ORAL | 1 refills | Status: AC
  Filled 2022-01-17 – 2022-04-15 (×4): qty 90, 90d supply, fill #0

## 2022-01-17 MED ORDER — OMEPRAZOLE 40 MG PO CPDR
40.0000 mg | DELAYED_RELEASE_CAPSULE | Freq: Every day | ORAL | 1 refills | Status: DC
  Filled 2022-01-17 – 2022-03-06 (×2): qty 90, 90d supply, fill #0
  Filled 2022-05-21: qty 90, 90d supply, fill #1

## 2022-01-17 MED ORDER — ONDANSETRON 4 MG PO TBDP
4.0000 mg | ORAL_TABLET | Freq: Three times a day (TID) | ORAL | 1 refills | Status: DC | PRN
  Filled 2022-01-17: qty 20, 7d supply, fill #0
  Filled 2022-02-12: qty 20, 7d supply, fill #1

## 2022-01-17 MED ORDER — PREGABALIN 75 MG PO CAPS
75.0000 mg | ORAL_CAPSULE | Freq: Two times a day (BID) | ORAL | 0 refills | Status: DC
  Filled 2022-01-17: qty 60, 30d supply, fill #0

## 2022-01-17 MED ORDER — ONDANSETRON HCL 4 MG PO TABS
4.0000 mg | ORAL_TABLET | Freq: Every day | ORAL | 1 refills | Status: DC | PRN
  Filled 2022-01-17: qty 21, 30d supply, fill #0

## 2022-01-17 MED ORDER — FAMOTIDINE 40 MG PO TABS
40.0000 mg | ORAL_TABLET | Freq: Every evening | ORAL | 1 refills | Status: DC
  Filled 2022-01-17: qty 90, 90d supply, fill #0
  Filled 2022-04-15: qty 90, 90d supply, fill #1

## 2022-01-17 NOTE — Progress Notes (Signed)
? ?Assessment & Plan:  ?Diagnoses and all orders for this visit: ? ?Neuropathy ?-     pregabalin (LYRICA) 75 MG capsule; Take 1 capsule (75 mg total) by mouth 2 (two) times daily. ?-     Ambulatory referral to Podiatry ?-     Vitamin B12 ? ?Gastroesophageal reflux disease with esophagitis without hemorrhage ?-     ondansetron (ZOFRAN-ODT) 4 MG disintegrating tablet; Take 1 tablet (4 mg total) by mouth every 8 (eight) hours as needed for nausea or vomiting. ?-     omeprazole (PRILOSEC) 40 MG capsule; Take 1 capsule (40 mg total) by mouth daily. ?-     ondansetron (ZOFRAN) 4 MG tablet; Take 1 tablet (4 mg total) by mouth daily as needed for nausea or vomiting. ?-     famotidine (PEPCID) 40 MG tablet; Take 1 tablet (40 mg total) by mouth every evening.\\ ?INSTRUCTIONS: Avoid GERD Triggers: acidic, spicy or fried foods, caffeine, coffee, sodas,  alcohol and chocolate.   ? ?Scoliosis, unspecified scoliosis type, unspecified spinal region ?Incidental finding on previous x-ray ?-     DG SCOLIOSIS EVAL COMPLETE SPINE 2 OR 3 VIEWS; Future ? ?Leukocytosis, unspecified type ?-     CBC with Differential ? ?Increased vitamin B12 level ?-     Vitamin B12 ? ?Hypokalemia ?-     CMP14+EGFR ? ?Uncomplicated alcohol dependence (HCC) ?-     folic acid (FOLVITE) 1 MG tablet; Take 1 tablet (1 mg total) by mouth daily. ? ?Iron deficiency anemia, unspecified iron deficiency anemia type ?-     ferrous sulfate 325 (65 FE) MG tablet; Take 1 tablet (325 mg total) by mouth daily. ? ? ? ?Patient has been counseled on age-appropriate routine health concerns for screening and prevention. These are reviewed and up-to-date. Referrals have been placed accordingly. Immunizations are up-to-date or declined.    ?Subjective:  ?No chief complaint on file. ? ?HPI ?Sandra Pearson 36 y.o. female presents to office today with complaints of significant neuropathy in the bilateral feet which is now radiating upwards into the bilateral lower legs.   Neuropathy symptoms are described as shooting pain and numbness. Ongoing for several months despite taking upwards of 634m Gabapentin BID. She does have significant past medical history of EtOH abuse "when I was younger".  She currently drinks 2-3 beers or more nightly several times a week.  Neuropathy is significant enough to cause mobility walking long distances ? ?We will refer to podiatry.  May possibly end up needing neurology consult and/or nerve conduction test  ?She is a smoker however current symptoms are not typical of any claudication. ? ?Review of Systems  ?Constitutional:  Negative for fever, malaise/fatigue and weight loss.  ?HENT: Negative.  Negative for nosebleeds.   ?Eyes: Negative.  Negative for blurred vision, double vision and photophobia.  ?Respiratory: Negative.  Negative for cough and shortness of breath.   ?Cardiovascular: Negative.  Negative for chest pain, palpitations and leg swelling.  ?Gastrointestinal:  Positive for heartburn (Well-controlled with H2 antagonist and PPI). Negative for abdominal pain, blood in stool, constipation, diarrhea, melena, nausea and vomiting.  ?Genitourinary: Negative.   ?Musculoskeletal: Negative.  Negative for myalgias.  ?Neurological:  Positive for tingling and sensory change. Negative for dizziness, focal weakness, seizures and headaches.  ?Psychiatric/Behavioral: Negative.  Negative for suicidal ideas.   ? ?Past Medical History:  ?Diagnosis Date  ? 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 Anemia   ? was iron deficiency but labs improved  ? GERD (gastroesophageal  reflux disease)   ? with esophagitis  ? H. pylori infection 05/2021  ? stopped medication for this as it may have effected pancreatitis  ? History of blood transfusion   ? Hypertension   ? has never been treated for htn  ? Hypomagnesemia   ? Takes iron supplements   ? ? ?Past Surgical History:  ?Procedure Laterality Date  ? COLONOSCOPY N/A 03/15/2021  ? Procedure: COLONOSCOPY;  Surgeon: Virgel Manifold, MD;  Location[facility] ENDOSCOPY;  Service: Endoscopy;  Laterality: N/A;  ? ESOPHAGOGASTRODUODENOSCOPY N/A 03/15/2021  ? Procedure: ESOPHAGOGASTRODUODENOSCOPY (EGD);  Surgeon: Virgel Manifold, MD;  Location: [facility] ENDOSCOPY;  Service: Endoscopy;  Laterality: N/A;  ? [place]  ? unsure if any metal. does not think so  ? ? ?Family History  ?Problem Relation Age of Onset  ? Hepatitis C Mother   ? ? ?Social History Reviewed with no changes to be made today.  ? ?Outpatient Medications Prior to Visit  ?Medication Sig Dispense Refill  ? acetaminophen (TYLENOL) 500 MG tablet Take 2 tablets (1,000 mg total) by mouth every 6 (six) hours as needed for mild pain.    ? thiamine 100 MG tablet Take 1 tablet (100 mg total) by mouth daily. 30 tablet 0  ? ondansetron (ZOFRAN) 4 MG tablet Take 1 tablet (4 mg total) by mouth daily as needed for nausea or vomiting. 30 tablet 1  ? vitamin B-12 (CYANOCOBALAMIN) 1000 MCG tablet Take 1 tablet (1,000 mcg total) by mouth daily. 30 tablet 0  ? aluminum-magnesium hydroxide-simethicone (MAALOX) 970-263-78 MG/5ML SUSP Take 30 mLs by mouth 4 (four) times daily -  before meals and at bedtime. (Patient not taking: Reported on 01/17/2022) 355 mL 0  ? famotidine (PEPCID) 40 MG tablet Take 1 tablet (40 mg total) by mouth every evening. 30 tablet 0  ? ferrous sulfate 325 (65 FE) MG tablet Take 1 tablet (325 mg total) by mouth daily. 30 tablet 0  ? gabapentin (NEURONTIN) 300 MG capsule Take 2 capsules (600 mg total) by mouth 3 (three) times daily. 180 capsule 3  ? Multiple Vitamins-Minerals (WOMENS 50+ MULTI VITAMIN/MIN PO) Take 1 tablet by mouth daily.    ? omeprazole (PRILOSEC) 40 MG capsule Take 1 capsule (40 mg total) by mouth daily. 30 capsule 3  ? ondansetron (ZOFRAN ODT) 4 MG disintegrating tablet Take 1 tablet (4 mg total) by mouth every 8 (eight) hours as needed for nausea or vomiting. (Patient not taking: Reported on 01/17/2022) 20 tablet 0  ? traMADol (ULTRAM)  50 MG tablet Take 1 tablet (50 mg total) by mouth every 6 (six) hours as needed for moderate pain. (Patient not taking: Reported on 01/17/2022) 12 tablet 0  ? ?No facility-administered medications prior to visit.  ? ? ?No Known Allergies ? ?   ?Objective:  ?  ?BP 124/86   Pulse 79   Wt 140 lb 6.4 oz (63.7 kg)   SpO2 94%   BMI 21.35 kg/m?  ?Wt Readings from Last 3 Encounters:  ?01/17/22 140 lb 6.4 oz (63.7 kg)  ?10/15/21 132 lb 4.4 oz (60 kg)  ?10/04/21 136 lb 3.9 oz (61.8 kg)  ? ? ?Physical Exam ?Vitals and nursing note reviewed.  ?Constitutional:   ?   Appearance: She is well-developed.  ?HENT:  ?   Head: Normocephalic and atraumatic.  ?Cardiovascular:  ?   Rate and Rhythm: Normal rate and regular rhythm.  ?   Heart sounds: Normal heart sounds. No murmur heard. ?  No friction rub. No gallop.  ?Pulmonary:  ?   Effort: Pulmonary effort is normal. No tachypnea or respiratory distress.  ?   Breath sounds: Normal breath sounds. No decreased breath sounds, wheezing, rhonchi or rales.  ?Chest:  ?   Chest wall: No tenderness.  ?Abdominal:  ?   General: Bowel sounds are normal.  ?   Palpations: Abdomen is soft.  ?Musculoskeletal:     ?   General: Normal range of motion.  ?   Cervical back: Normal range of motion.  ?Skin: ?   General: Skin is warm and dry.  ?Neurological:  ?   Mental Status: She is alert and oriented to person, place, and time.  ?   Coordination: Coordination normal.  ?Psychiatric:     ?   Mood and Affect: Mood is anxious.     ?   Behavior: Behavior normal. Behavior is cooperative.     ?   Thought Content: Thought content normal.     ?   Judgment: Judgment normal.  ? ? ? ? ?   ?Patient has been counseled extensively about nutrition and exercise as well as the importance of adherence with medications and regular follow-up. The patient was given clear instructions to go to ER or return to medical center if symptoms don't improve, worsen or new problems develop. The patient verbalized understanding.   ? ?Follow-up: Return in about 6 months (around 07/20/2022).  ? ?Gildardo Pounds, FNP-BC ?[facility] ?[place], [place] ?[phone]   ?01/17/2022, 1:23 PM ?

## 2022-01-17 NOTE — Telephone Encounter (Signed)
Copied from CRM [id number]. Topic: General - Other >> Jan 17, 2022  1:52 PM McGill, Darlina Rumpf wrote: Reason for CRM: Pt stated PCP sent her to get an x-ray at [facility] radiology downstairs ([facility]) however, she was advised they do not accept her insurance they are not a part of [place]. Pt stated that she needs this sent to [facility], as they do accept her insurance of [employer].  Please advise.

## 2022-01-18 LAB — CMP14+EGFR
ALT: 26 IU/L (ref 0–32)
AST: 29 IU/L (ref 0–40)
Albumin/Globulin Ratio: 1.4 (ref 1.2–2.2)
Albumin: 4.3 g/dL (ref 3.8–4.8)
Alkaline Phosphatase: 128 IU/L — ABNORMAL HIGH (ref 44–121)
BUN/Creatinine Ratio: 16 (ref 9–23)
BUN: 11 mg/dL (ref 6–20)
Bilirubin Total: 0.3 mg/dL (ref 0.0–1.2)
CO2: 22 mmol/L (ref 20–29)
Calcium: 10.1 mg/dL (ref 8.7–10.2)
Chloride: 100 mmol/L (ref 96–106)
Creatinine, Ser: 0.67 mg/dL (ref 0.57–1.00)
Globulin, Total: 3 g/dL (ref 1.5–4.5)
Glucose: 92 mg/dL (ref 70–99)
Potassium: 4.6 mmol/L (ref 3.5–5.2)
Sodium: 136 mmol/L (ref 134–144)
Total Protein: 7.3 g/dL (ref 6.0–8.5)
eGFR: 118 mL/min/{1.73_m2} (ref >59)

## 2022-01-18 LAB — CBC WITH DIFFERENTIAL/PLATELET
Basophils Absolute: 0 10*3/uL (ref 0.0–0.2)
Basos: 1 %
EOS (ABSOLUTE): 0.2 10*3/uL (ref 0.0–0.4)
Eos: 3 %
Hematocrit: 46 % (ref 34.0–46.6)
Hemoglobin: 15.2 g/dL (ref 11.1–15.9)
Immature Grans (Abs): 0 10*3/uL (ref 0.0–0.1)
Immature Granulocytes: 0 %
Lymphocytes Absolute: 2.6 10*3/uL (ref 0.7–3.1)
Lymphs: 38 %
MCH: 30.3 pg (ref 26.6–33.0)
MCHC: 33 g/dL (ref 31.5–35.7)
MCV: 92 fL (ref 79–97)
Monocytes Absolute: 0.4 10*3/uL (ref 0.1–0.9)
Monocytes: 6 %
Neutrophils Absolute: 3.6 10*3/uL (ref 1.4–7.0)
Neutrophils: 52 %
Platelets: 306 10*3/uL (ref 150–450)
RBC: 5.01 x10E6/uL (ref 3.77–5.28)
RDW: 13.7 % (ref 11.7–15.4)
WBC: 6.9 10*3/uL (ref 3.4–10.8)

## 2022-01-18 LAB — VITAMIN B12: Vitamin B-12: 728 pg/mL (ref 232–1245)

## 2022-01-20 ENCOUNTER — Other Ambulatory Visit: Payer: Self-pay

## 2022-01-20 ENCOUNTER — Encounter: Payer: Self-pay | Admitting: Oncology

## 2022-01-22 ENCOUNTER — Ambulatory Visit (INDEPENDENT_AMBULATORY_CARE_PROVIDER_SITE_OTHER): Payer: 59 | Admitting: Podiatry

## 2022-01-22 ENCOUNTER — Encounter: Payer: Self-pay | Admitting: Podiatry

## 2022-01-22 DIAGNOSIS — G603 Idiopathic progressive neuropathy: Secondary | ICD-10-CM

## 2022-01-22 NOTE — Progress Notes (Signed)
?  Subjective:  ?Patient ID: Sandra Pearson, female    DOB: [birth year],   MRN: [id number] ? ?Chief Complaint  ?Patient presents with  ? Foot Problem  ?   Neuropathy   ? ? ?36 y.o. female presents for concern of neuropathy in bilateral feet. Relates this has been going on since 2022. Relates feet feel heavy and are numb and very sensitive. Relates shooting sensation up her legs as well.  Has been on gabapentin wihtout relief and has recently started lyrica. Does relates a history of alcohol use and is currently taking B12.  . Denies any other pedal complaints. Denies n/v/f/c.  ? ?Past Medical History:  ?Diagnosis Date  ? 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 Anemia   ? was iron deficiency but labs improved  ? GERD (gastroesophageal reflux disease)   ? with esophagitis  ? H. pylori infection 05/2021  ? stopped medication for this as it may have effected pancreatitis  ? History of blood transfusion   ? Hypertension   ? has never been treated for htn  ? Hypomagnesemia   ? Takes iron supplements   ? ? ?Objective:  ?Physical Exam: ?Vascular: DP/PT pulses 2/4 bilateral. CFT <3 seconds. Normal hair growth on digits. No edema.  ?Skin. No lacerations or abrasions bilateral feet.  ?Musculoskeletal: MMT 5/5 bilateral lower extremities in DF, PF, Inversion and Eversion. Deceased ROM in DF of ankle joint. Pain circumferentially around feet and up bilateral legs.  ?Neurological: Sensation intact to light touch. Protective sensation diminished.  ? ?Assessment:  ? ?1. Idiopathic progressive neuropathy   ? ? ? ?Plan:  ?Patient was evaluated and treated and all questions answered. ?Discussed neuropathy and etiology as well as treatment with patient.  ?Radiographs reviewed and discussed with patient.  ?-Discussed and educated patient on foot care, especially with  ?regards to the vascular, neurological and musculoskeletal systems.  ?-Discussed supportive shoes at all times and checking feet regularly.  ?-Referral to  neurology to discuss options and NCV. May be candidate for nerve stimulation.  ?-Discussed topical treatments.  ?-Patient to return as needed.  ? ? ?Louann Sjogren, DPM  ? ? ?

## 2022-01-29 ENCOUNTER — Other Ambulatory Visit: Payer: Self-pay | Admitting: Podiatry

## 2022-01-29 ENCOUNTER — Encounter: Payer: Self-pay | Admitting: Neurology

## 2022-01-29 DIAGNOSIS — G603 Idiopathic progressive neuropathy: Secondary | ICD-10-CM

## 2022-02-12 ENCOUNTER — Other Ambulatory Visit: Payer: Self-pay | Admitting: Nurse Practitioner

## 2022-02-12 DIAGNOSIS — G629 Polyneuropathy, unspecified: Secondary | ICD-10-CM

## 2022-02-12 NOTE — Telephone Encounter (Signed)
Requested medication (s) are due for refill today: yes ? ?Requested medication (s) are on the active medication list: yes ? ?Last refill:  01/17/22 ? ?Future visit scheduled: yes ? ?Notes to clinic:  Unable to refill per protocol, cannot delegate. ? ? ? ?  ?Requested Prescriptions  ?Pending Prescriptions Disp Refills  ? pregabalin (LYRICA) 75 MG capsule 60 capsule 0  ?  Sig: Take 1 capsule (75 mg total) by mouth 2 (two) times daily.  ?  ? Not Delegated - Neurology:  Anticonvulsants - Controlled - pregabalin Failed - 02/12/2022  9:06 AM  ?  ?  Failed - This refill cannot be delegated  ?  ?  Passed - Cr in normal range and within 360 days  ?  Creatinine, Ser  ?Date Value Ref Range Status  ?01/17/2022 0.67 0.57 - 1.00 mg/dL Final  ?  ?  ?  ?  Passed - Completed PHQ-2 or PHQ-9 in the last 360 days  ?  ?  Passed - Valid encounter within last 12 months  ?  Recent Outpatient Visits   ? ?      ? 3 weeks ago Neuropathy  ? [facility] Claiborne Rigg, NP  ? 4 months ago Hospital discharge follow-up  ? [facility] [place], [place] W, NP  ? 6 months ago Polyneuropathy associated with underlying disease (HCC)  ? [facility] [place], [place] W, NP  ? 7 months ago Encounter for Papanicolaou smear for cervical cancer screening  ? [facility] [place], Shea Stakes, NP  ? 9 months ago Encounter to establish care  ? [facility] Claiborne Rigg, NP  ? ?  ?  ?Future Appointments   ? ?        ? In 5 months Glendale Chard, DO [place] [facility]  ? In 5 months Claiborne Rigg, NP [facility]  ? ?  ? ? ?  ?  ?  ? ? ?

## 2022-02-13 ENCOUNTER — Other Ambulatory Visit: Payer: Self-pay

## 2022-02-13 MED ORDER — PREGABALIN 75 MG PO CAPS
75.0000 mg | ORAL_CAPSULE | Freq: Two times a day (BID) | ORAL | 0 refills | Status: DC
Start: 2022-02-20 — End: 2022-03-24
  Filled 2022-02-13 – 2022-02-18 (×2): qty 60, 30d supply, fill #0

## 2022-02-14 ENCOUNTER — Other Ambulatory Visit: Payer: Self-pay

## 2022-02-18 ENCOUNTER — Other Ambulatory Visit: Payer: Self-pay

## 2022-03-06 ENCOUNTER — Other Ambulatory Visit: Payer: Self-pay

## 2022-03-24 ENCOUNTER — Other Ambulatory Visit: Payer: Self-pay | Admitting: Nurse Practitioner

## 2022-03-24 DIAGNOSIS — G629 Polyneuropathy, unspecified: Secondary | ICD-10-CM

## 2022-03-25 ENCOUNTER — Other Ambulatory Visit: Payer: Self-pay

## 2022-03-25 MED ORDER — PREGABALIN 75 MG PO CAPS
75.0000 mg | ORAL_CAPSULE | Freq: Two times a day (BID) | ORAL | 0 refills | Status: DC
Start: 2022-03-25 — End: 2022-04-15
  Filled 2022-03-25: qty 60, 30d supply, fill #0

## 2022-03-25 NOTE — Telephone Encounter (Signed)
Pt following up on med request for pregabalin (LYRICA) 75 MG capsule Pt states she called last week for a future refill, which did not happen.  [facility]

## 2022-03-27 ENCOUNTER — Other Ambulatory Visit: Payer: Self-pay | Admitting: Nurse Practitioner

## 2022-03-27 DIAGNOSIS — K2100 Gastro-esophageal reflux disease with esophagitis, without bleeding: Secondary | ICD-10-CM

## 2022-03-27 MED ORDER — ONDANSETRON 4 MG PO TBDP
4.0000 mg | ORAL_TABLET | Freq: Three times a day (TID) | ORAL | 1 refills | Status: DC | PRN
  Filled 2022-03-27: qty 20, 7d supply, fill #0
  Filled 2022-04-25: qty 20, 7d supply, fill #1

## 2022-03-28 ENCOUNTER — Other Ambulatory Visit: Payer: Self-pay

## 2022-04-15 ENCOUNTER — Encounter: Payer: Self-pay | Admitting: Oncology

## 2022-04-15 ENCOUNTER — Other Ambulatory Visit: Payer: Self-pay

## 2022-04-15 ENCOUNTER — Other Ambulatory Visit: Payer: Self-pay | Admitting: Nurse Practitioner

## 2022-04-15 DIAGNOSIS — G629 Polyneuropathy, unspecified: Secondary | ICD-10-CM

## 2022-04-17 ENCOUNTER — Other Ambulatory Visit: Payer: Self-pay

## 2022-04-18 MED ORDER — PREGABALIN 75 MG PO CAPS
75.0000 mg | ORAL_CAPSULE | Freq: Two times a day (BID) | ORAL | 0 refills | Status: DC
  Filled 2022-04-18 – 2022-04-25 (×2): qty 60, 30d supply, fill #0

## 2022-04-21 ENCOUNTER — Other Ambulatory Visit: Payer: Self-pay

## 2022-04-25 ENCOUNTER — Other Ambulatory Visit: Payer: Self-pay

## 2022-05-03 ENCOUNTER — Emergency Department: Payer: 59

## 2022-05-03 ENCOUNTER — Encounter: Payer: Self-pay | Admitting: Emergency Medicine

## 2022-05-03 ENCOUNTER — Emergency Department
Admission: EM | Admit: 2022-05-03 | Discharge: 2022-05-03 | Disposition: A | Discharge status: Home / Self Care | Payer: 59 | Attending: Emergency Medicine | Admitting: Emergency Medicine

## 2022-05-03 ENCOUNTER — Other Ambulatory Visit: Payer: Self-pay

## 2022-05-03 ENCOUNTER — Encounter: Payer: Self-pay | Admitting: Oncology

## 2022-05-03 DIAGNOSIS — D72829 Elevated white blood cell count, unspecified: Secondary | ICD-10-CM | POA: Diagnosis not present

## 2022-05-03 DIAGNOSIS — K859 Acute pancreatitis without necrosis or infection, unspecified: Secondary | ICD-10-CM | POA: Diagnosis not present

## 2022-05-03 DIAGNOSIS — R1013 Epigastric pain: Secondary | ICD-10-CM | POA: Diagnosis present

## 2022-05-03 DIAGNOSIS — R000 Tachycardia, unspecified: Secondary | ICD-10-CM | POA: Diagnosis not present

## 2022-05-03 LAB — BASIC METABOLIC PANEL
Anion gap: 11 (ref 5–15)
BUN: 13 mg/dL (ref 6–20)
CO2: 19 mmol/L — ABNORMAL LOW (ref 22–32)
Calcium: 8.5 mg/dL — ABNORMAL LOW (ref 8.9–10.3)
Chloride: 102 mmol/L (ref 98–111)
Creatinine, Ser: 0.66 mg/dL (ref 0.44–1.00)
GFR, Estimated: >60 mL/min (ref >60)
Glucose, Bld: 70 mg/dL (ref 70–99)
Potassium: 3.9 mmol/L (ref 3.5–5.1)
Sodium: 132 mmol/L — ABNORMAL LOW (ref 135–145)

## 2022-05-03 LAB — COMPREHENSIVE METABOLIC PANEL
ALT: 21 U/L (ref 0–44)
AST: 21 U/L (ref 15–41)
Albumin: 4.3 g/dL (ref 3.5–5.0)
Alkaline Phosphatase: 113 U/L (ref 38–126)
Anion gap: 16 — ABNORMAL HIGH (ref 5–15)
BUN: 14 mg/dL (ref 6–20)
CO2: 18 mmol/L — ABNORMAL LOW (ref 22–32)
Calcium: 9.6 mg/dL (ref 8.9–10.3)
Chloride: 101 mmol/L (ref 98–111)
Creatinine, Ser: 0.69 mg/dL (ref 0.44–1.00)
GFR, Estimated: >60 mL/min (ref >60)
Glucose, Bld: 90 mg/dL (ref 70–99)
Potassium: 4.3 mmol/L (ref 3.5–5.1)
Sodium: 135 mmol/L (ref 135–145)
Total Bilirubin: 1.4 mg/dL — ABNORMAL HIGH (ref 0.3–1.2)
Total Protein: 8.8 g/dL — ABNORMAL HIGH (ref 6.5–8.1)

## 2022-05-03 LAB — URINALYSIS, ROUTINE W REFLEX MICROSCOPIC
Bilirubin Urine: NEGATIVE
Glucose, UA: NEGATIVE mg/dL
Hgb urine dipstick: NEGATIVE
Ketones, ur: 80 mg/dL — AB
Leukocytes,Ua: NEGATIVE
Nitrite: NEGATIVE
Protein, ur: 30 mg/dL — AB
Specific Gravity, Urine: 1.027 (ref 1.005–1.030)
pH: 5 (ref 5.0–8.0)

## 2022-05-03 LAB — CBC
HCT: 45 % (ref 36.0–46.0)
Hemoglobin: 15 g/dL (ref 12.0–15.0)
MCH: 31.3 pg (ref 26.0–34.0)
MCHC: 33.3 g/dL (ref 30.0–36.0)
MCV: 93.9 fL (ref 80.0–100.0)
Platelets: 189 10*3/uL (ref 150–400)
RBC: 4.79 MIL/uL (ref 3.87–5.11)
RDW: 13.6 % (ref 11.5–15.5)
WBC: 12.2 10*3/uL — ABNORMAL HIGH (ref 4.0–10.5)
nRBC: 0 % (ref 0.0–0.2)

## 2022-05-03 LAB — LACTIC ACID, PLASMA: Lactic Acid, Venous: 0.9 mmol/L (ref 0.5–1.9)

## 2022-05-03 LAB — PREGNANCY, URINE: Preg Test, Ur: NEGATIVE

## 2022-05-03 LAB — LIPASE, BLOOD: Lipase: 136 U/L — ABNORMAL HIGH (ref 11–51)

## 2022-05-03 MED ORDER — ONDANSETRON HCL 4 MG/2ML IJ SOLN
4.0000 mg | Freq: Once | INTRAMUSCULAR | Status: AC
  Administered 2022-05-03: 4 mg via INTRAVENOUS
  Filled 2022-05-03: qty 2

## 2022-05-03 MED ORDER — IOHEXOL 300 MG/ML  SOLN
100.0000 mL | Freq: Once | INTRAMUSCULAR | Status: AC | PRN
  Administered 2022-05-03: 100 mL via INTRAVENOUS

## 2022-05-03 MED ORDER — SODIUM CHLORIDE 0.9 % IV BOLUS
1000.0000 mL | Freq: Once | INTRAVENOUS | Status: AC
  Administered 2022-05-03: 1000 mL via INTRAVENOUS

## 2022-05-03 MED ORDER — DIPHENHYDRAMINE HCL 50 MG/ML IJ SOLN
25.0000 mg | Freq: Once | INTRAMUSCULAR | Status: AC
  Administered 2022-05-03: 25 mg via INTRAVENOUS
  Filled 2022-05-03: qty 1

## 2022-05-03 MED ORDER — MORPHINE SULFATE (PF) 4 MG/ML IV SOLN
4.0000 mg | Freq: Once | INTRAVENOUS | Status: AC
  Administered 2022-05-03: 4 mg via INTRAVENOUS
  Filled 2022-05-03: qty 1

## 2022-05-03 NOTE — ED Notes (Signed)
Lab called to have them run UP.

## 2022-05-03 NOTE — ED Notes (Signed)
Lab called to add on BMP.

## 2022-05-03 NOTE — ED Notes (Signed)
After admin morphine pt complaint of itching. She said this happens when she receives pain meds and could she have some benadryl. Will make MD aware.

## 2022-05-03 NOTE — ED Notes (Signed)
Two sets of blood cultures sent down.  ?

## 2022-05-03 NOTE — ED Triage Notes (Signed)
Pt reports pain to LUQ for the last 3 days. Pt reports pain radiates into her mid epigastric area as well and gets worse if she drinks or eats. Pt reports some nausea and diarrhea as well.

## 2022-05-03 NOTE — ED Notes (Signed)
Rn called CT. Pt is next in que for CT.

## 2022-05-03 NOTE — ED Provider Notes (Signed)
[facility] Provider Note    Event Date/Time   First MD Initiated Contact with Patient 05/03/22 [phone]     (approximate)   History   Abdominal Pain, Nausea, and Diarrhea   HPI  Sandra Pearson is a 36 y.o. female with a history of gallstones/alcoholic pancreatitis, hiatal hernia, and anemia who presents with epigastric abdominal pain over approximately last 3 days, persistent course, and associated with nausea and decreased appetite.  The patient is not able to eat or drink anything in the last few days.  She reports some loose stools as well but these have subsided.  She denies any fever or chills.  She states that up to 3 days ago she was drinking a little bit, usually around 1-2 beers in the evening but no longer drinks liquor and has not been drinking heavily anytime recently.    Physical Exam   Triage Vital Signs: ED Triage Vitals  Enc Vitals Group     BP 05/03/22 0911 105/78     Pulse Rate 05/03/22 0911 (!) 107     Resp 05/03/22 0911 18     Temp 05/03/22 0911 98.8 F (37.1 C)     Temp Source 05/03/22 0911 Oral     SpO2 05/03/22 0911 98 %     Weight 05/03/22 0843 137 lb (62.1 kg)     Height 05/03/22 0843 5\' 8"  (1.727 m)     Head Circumference --      Peak Flow --      Pain Score 05/03/22 0842 5     Pain Loc --      Pain Edu? --      Excl. in GC? --     Most recent vital signs: Vitals:   05/03/22 1330 05/03/22 1530  BP: 100/60 110/62  Pulse: 75 77  Resp: 16 16  Temp:    SpO2: 100% 100%     General: Alert and oriented, comfortable appearing. CV:  Good peripheral perfusion.  Resp:  Normal effort.  Abd:  Soft with mild epigastric tenderness.  No distention.  Other:  No scleral icterus.  Dry mucous membranes.   ED Results / Procedures / Treatments   Labs (all labs ordered are listed, but only abnormal results are displayed) Labs Reviewed  LIPASE, BLOOD - Abnormal; Notable for the following components:      Result Value    Lipase 136 (*)    All other components within normal limits  COMPREHENSIVE METABOLIC PANEL - Abnormal; Notable for the following components:   CO2 18 (*)    Total Protein 8.8 (*)    Total Bilirubin 1.4 (*)    Anion gap 16 (*)    All other components within normal limits  CBC - Abnormal; Notable for the following components:   WBC 12.2 (*)    All other components within normal limits  URINALYSIS, ROUTINE W REFLEX MICROSCOPIC - Abnormal; Notable for the following components:   Color, Urine YELLOW (*)    APPearance CLOUDY (*)    Ketones, ur 80 (*)    Protein, ur 30 (*)    Bacteria, UA RARE (*)    All other components within normal limits  LACTIC ACID, PLASMA  PREGNANCY, URINE  LACTIC ACID, PLASMA  BASIC METABOLIC PANEL     EKG  ED ECG REPORT I, 07/04/22, the attending physician, personally viewed and interpreted this ECG.  Date: 05/03/2022 EKG Time: 09 10 Rate: 105 Rhythm: Sinus tachycardia QRS Axis: normal Intervals:  normal ST/T Wave abnormalities: Nonspecific T wave abnormalities Narrative Interpretation: no evidence of acute ischemia    RADIOLOGY  CT ab/pelvis: Pending  PROCEDURES:  Critical Care performed: No  Procedures   MEDICATIONS ORDERED IN ED: Medications  ondansetron (ZOFRAN) injection 4 mg (4 mg Intravenous Given 05/03/22 1022)  morphine (PF) 4 MG/ML injection 4 mg (4 mg Intravenous Given 05/03/22 1022)  sodium chloride 0.9 % bolus 1,000 mL (0 mLs Intravenous Stopped 05/03/22 1227)  diphenhydrAMINE (BENADRYL) injection 25 mg (25 mg Intravenous Given 05/03/22 1043)  iohexol (OMNIPAQUE) 300 MG/ML solution 100 mL (100 mLs Intravenous Contrast Given 05/03/22 1504)     IMPRESSION / MDM / ASSESSMENT AND PLAN / ED COURSE  I reviewed the triage vital signs and the nursing notes.  36 year old female with PMH as noted above presents with epigastric abdominal pain, nausea, decreased appetite over the last several days.  I reviewed the past medical  records.  The patient was most recently admitted in December 2022 and per the hospitalist discharge summary from 10/18/2021 she presented with right upper quadrant abdominal pain and acute pancreatitis.  She is status post cholecystectomy.  On exam currently the patient is overall well-appearing.  She is slightly tachycardic with otherwise normal vital signs.  The abdomen is soft with mild epigastric tenderness.  Differential diagnosis includes, but is not limited to, pancreatitis, gastritis, PUD, gastroparesis.  Patient's presentation is most consistent with acute presentation with potential threat to life or bodily function.  We will obtain lab work-up, CT abdomen/pelvis, give fluids and analgesia and reassess.  The patient is on the cardiac monitor to evaluate for evidence of arrhythmia and/or significant heart rate changes.  ----------------------------------------- 3:40 PM on 05/03/2022 -----------------------------------------  Initial labs revealed mildly elevated lipase, elevated anion gap, but normal lactate.  The patient has mild leukocytosis.  CT abdomen and repeat BMP are pending.  I have signed the patient out to the oncoming ED physician Dr. Scotty Court.   FINAL CLINICAL IMPRESSION(S) / ED DIAGNOSES   Final diagnoses:  Acute pancreatitis, unspecified complication status, unspecified pancreatitis type     Rx / DC Orders   ED Discharge Orders     None        Note:  This document was prepared using Dragon voice recognition software and may include unintentional dictation errors.    Dionne Bucy, MD 05/03/22 [phone]

## 2022-05-21 ENCOUNTER — Other Ambulatory Visit: Payer: Self-pay | Admitting: Nurse Practitioner

## 2022-05-21 ENCOUNTER — Other Ambulatory Visit: Payer: Self-pay

## 2022-05-21 DIAGNOSIS — G629 Polyneuropathy, unspecified: Secondary | ICD-10-CM

## 2022-05-23 ENCOUNTER — Ambulatory Visit: Payer: Self-pay | Admitting: *Deleted

## 2022-05-23 ENCOUNTER — Other Ambulatory Visit: Payer: Self-pay

## 2022-05-23 NOTE — Telephone Encounter (Signed)
Patient calling back in regards to prescription refill for lyrica 75 mg. Rx due to refill on 05/25/22 and will run out of medication Sunday. Patient requesting if Rx can be called in to pharmacy due to will run out on weekend. Please advise.please advise pharmacy closed at 6 pm tonight .    Reason for Disposition  Caller requesting a CONTROLLED substance prescription refill (e.g., narcotics, ADHD medicines)  Answer Assessment - Initial Assessment Questions 1. DRUG NAME: "What medicine do you need to have refilled?"     Lyrica 75 mg  2. REFILLS REMAINING: "How many refills are remaining?" (Note: The label on the medicine or pill bottle will show how many refills are remaining. If there are no refills remaining, then a renewal may be needed.)     Yes  3. EXPIRATION DATE: "What is the expiration date?" (Note: The label states when the prescription will expire, and thus can no longer be refilled.)     05/25/22 4. PRESCRIBING HCP: "Who prescribed it?" Reason: If prescribed by specialist, call should be referred to that group.     PCP 5. SYMPTOMS: "Do you have any symptoms?"     Na. Patient will run out of medication on Sunday and pharmacy closed. 6. PREGNANCY: "Is there any chance that you are pregnant?" "When was your last menstrual period?"     na  Protocols used: Medication Refill and Renewal Call-A-AH

## 2022-05-23 NOTE — Telephone Encounter (Signed)
Pt called for update on the Rx refill request. Cb# [phone]

## 2022-05-25 ENCOUNTER — Other Ambulatory Visit: Payer: Self-pay | Admitting: Nurse Practitioner

## 2022-05-25 DIAGNOSIS — G629 Polyneuropathy, unspecified: Secondary | ICD-10-CM

## 2022-05-25 MED ORDER — PREGABALIN 75 MG PO CAPS
75.0000 mg | ORAL_CAPSULE | Freq: Two times a day (BID) | ORAL | 0 refills | Status: DC
  Filled 2022-05-25: qty 60, 30d supply, fill #0

## 2022-05-26 ENCOUNTER — Ambulatory Visit (HOSPITAL_COMMUNITY)
Admission: RE | Admit: 2022-05-26 | Discharge: 2022-05-26 | Disposition: A | Discharge status: Home / Self Care | Payer: 59 | Source: Ambulatory Visit | Attending: Nurse Practitioner | Admitting: Nurse Practitioner

## 2022-05-26 ENCOUNTER — Other Ambulatory Visit: Payer: Self-pay

## 2022-05-26 DIAGNOSIS — M419 Scoliosis, unspecified: Secondary | ICD-10-CM | POA: Insufficient documentation

## 2022-06-02 ENCOUNTER — Ambulatory Visit (INDEPENDENT_AMBULATORY_CARE_PROVIDER_SITE_OTHER): Payer: 59 | Admitting: Neurology

## 2022-06-02 ENCOUNTER — Other Ambulatory Visit: Payer: Self-pay

## 2022-06-02 ENCOUNTER — Other Ambulatory Visit: Payer: Self-pay | Admitting: Nurse Practitioner

## 2022-06-02 ENCOUNTER — Encounter: Payer: Self-pay | Admitting: Neurology

## 2022-06-02 VITALS — BP 130/86 | HR 114 | Ht 68.0 in | Wt 145.0 lb

## 2022-06-02 DIAGNOSIS — G621 Alcoholic polyneuropathy: Secondary | ICD-10-CM | POA: Diagnosis not present

## 2022-06-02 DIAGNOSIS — K2100 Gastro-esophageal reflux disease with esophagitis, without bleeding: Secondary | ICD-10-CM

## 2022-06-02 DIAGNOSIS — R2681 Unsteadiness on feet: Secondary | ICD-10-CM | POA: Diagnosis not present

## 2022-06-02 MED ORDER — PREGABALIN 100 MG PO CAPS
100.0000 mg | ORAL_CAPSULE | Freq: Two times a day (BID) | ORAL | 5 refills | Status: DC
  Filled 2022-06-02: qty 60, 30d supply, fill #0
  Filled 2022-07-02: qty 60, 30d supply, fill #1
  Filled 2022-07-31: qty 60, 30d supply, fill #2
  Filled 2022-08-28: qty 60, 30d supply, fill #3
  Filled 2022-09-26: qty 60, 30d supply, fill #4
  Filled 2022-10-24: qty 60, 30d supply, fill #5

## 2022-06-02 NOTE — Progress Notes (Signed)
[facility] Neurology Division Clinic Note - Initial Visit   Date: 06/02/22  Sandra Pearson MRN: [id number] DOB: 06/13/1986   Dear Dr. Ralene Cork:  Thank you for your kind referral of [place] for consultation of neuropathy. Although her history is well known to you, please allow [place] to reiterate it for the purpose of our medical record. The patient was accompanied to the clinic by self.    History of Present Illness: Sandra Pearson is a 36 y.o. right-handed female with GERD, alcohol dependence, tobacco use, iron deficient anemia presenting for evaluation of neuropathy.   Starting around 2020, she began having shooting pain, stinging/stabbing, and numbness involving bilateral feet, which has gradually extended into the lower legs up to the knees. Symptoms are constant with episodic severe pain.  No significant numbness/tingling of the hands. She endorses difficulty with walking long distance and has poor balance.  She walks with a cane as needed, no falls. No radicular back pain.  She has difficulty manipulating pedals when driving.  She has tried gabapentin 600mg  BID and now on Lyrica 75mg  twice daily.  She has not noticed that either medication has significant alleviated her pain.  She previously drank liquor heavily in her 89s, currently drinks 5-6 beers throughout the week.    Out-side paper records, electronic medical record, and images have been reviewed where available and summarized as:  No results found for: "HGBA1C" Lab Results  Component Value Date   VITAMINB12 728 01/17/2022   Lab Results  Component Value Date   TSH 1.625 03/13/2021   Lab Results  Component Value Date   ESRSEDRATE 12 04/01/2021    Past Medical History:  Diagnosis Date   Acute necrotizing pancreatitis 05/2021   Anemia    was iron deficiency but labs improved   GERD (gastroesophageal reflux disease)    with esophagitis   H. pylori infection 05/2021    stopped medication for this as it may have effected pancreatitis   History of blood transfusion    Hypertension    has never been treated for htn   Hypomagnesemia    Takes iron supplements     Past Surgical History:  Procedure Laterality Date   COLONOSCOPY N/A 03/15/2021   Procedure: COLONOSCOPY;  Surgeon: Pasty Spillers, MD;  Location: [facility] ENDOSCOPY;  Service: Endoscopy;  Laterality: N/A;   ESOPHAGOGASTRODUODENOSCOPY N/A 03/15/2021   Procedure: ESOPHAGOGASTRODUODENOSCOPY (EGD);  Surgeon: Pasty Spillers, MD;  Location: [facility] ENDOSCOPY;  Service: Endoscopy;  Laterality: N/A;   MANDIBLE FRACTURE SURGERY  1999   unsure if any metal. does not think so     Medications:  Outpatient Encounter Medications as of 06/02/2022  Medication Sig   acetaminophen (TYLENOL) 500 MG tablet Take 2 tablets (1,000 mg total) by mouth every 6 (six) hours as needed for mild pain.   famotidine (PEPCID) 40 MG tablet Take 1 tablet (40 mg total) by mouth every evening.   ferrous sulfate 325 (65 FE) MG tablet Take 1 tablet (325 mg total) by mouth daily.   folic acid (FOLVITE) 1 MG tablet Take 1 tablet (1 mg total) by mouth daily.   omeprazole (PRILOSEC) 40 MG capsule Take 1 capsule (40 mg total) by mouth daily.   ondansetron (ZOFRAN-ODT) 4 MG disintegrating tablet Dissolve 1 tablet (4 mg total) by mouth once every 8 (eight) hours as needed for nausea or vomiting.   pregabalin (LYRICA) 75 MG capsule Take 1 capsule (75 mg total) by mouth 2 (two) times daily.  thiamine 100 MG tablet Take 1 tablet (100 mg total) by mouth daily.   No facility-administered encounter medications on file as of 06/02/2022.    Allergies: No Known Allergies  Family History: Family History  Problem Relation Age of Onset   Hepatitis C Mother    Heart Problems Mother        Needed open heart surgery due to accident   Hepatitis C Father     Social History: Social History   Tobacco Use   Smoking status: Every Day     Packs/day: 0.50    Types: Cigarettes   Smokeless tobacco: Never   Tobacco comments:    Smokes 5-8 cigarettes per day   Vaping Use   Vaping Use: Some days  Substance Use Topics   Alcohol use: Yes    Comment: Couple beers a week   Drug use: No   Social History   Social History Narrative   Patient lives in her home with her husband. She states that she feels safe in her home.   She does not work. Has a little puppy.      Right Handed    Lives in a one story home     Vital Signs:  BP 130/86   Pulse (!) 114   Ht 5\' 8"  (1.727 m)   Wt 145 lb (65.8 kg)   LMP 04/17/2022   SpO2 97%   BMI 22.05 kg/m   Neurological Exam: MENTAL STATUS including orientation to time, place, person, recent and remote memory, attention span and concentration, language, and fund of knowledge is normal.  Speech is not dysarthric.  CRANIAL NERVES: II:  No visual field defects.    III-IV-VI: Pupils equal round and reactive to light.  Normal conjugate, extra-ocular eye movements in all directions of gaze.  No nystagmus.  No ptosis.   V:  Normal facial sensation.    VII:  Normal facial symmetry and movements.   VIII:  Normal hearing and vestibular function.   IX-X:  Normal palatal movement.   XI:  Normal shoulder shrug and head rotation.   XII:  Normal tongue strength and range of motion, no deviation or fasciculation.  MOTOR:  No atrophy, fasciculations or abnormal movements.  No pronator drift.   Upper Extremity:  Right  Left  Deltoid  5/5   5/5   Biceps  5/5   5/5   Triceps  5/5   5/5   Wrist extensors  5/5   5/5   Wrist flexors  5/5   5/5   Finger extensors  5/5   5/5   Finger flexors  5/5   5/5   Dorsal interossei  5/5   5/5   Abductor pollicis  5/5   5/5   Tone (Ashworth scale)  0  0   Lower Extremity:  Right  Left  Hip flexors  5/5   5/5   Hip extensors  5/5   5/5   Adductor 5/5  5/5  Abductor 5/5  5/5  Knee flexors  5/5   5/5   Knee extensors  5/5   5/5   Dorsiflexors  5-/5   5-/5    Plantarflexors  5-/5   5-/5   Toe extensors  4/5   4/5   Toe flexors  4/5   4/5   Tone (Ashworth scale)  0  0   MSRs:  Right        Left  brachioradialis 2+  2+  biceps 2+  2+  triceps 2+  2+  patellar 1+  1+  ankle jerk 0  0  plantar response down  down   SENSORY:  Reduced vibration below the ankles to 70%, hyperesthesia with pin prick and temperature.  Sensation intact in the upper extremities.  Romberg's sign present.   COORDINATION/GAIT: Normal finger-to- nose-finger.  Intact rapid alternating movements bilaterally.  Gait mildly wide-based and stable, unassisted.  Mild difficulty with tandem and stressed gait, but able to perform.   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  Her neurological examination shows a distal predominant large fiber peripheral neuropathy. I had extensive discussion with the patient regarding the pathogenesis, etiology, management, and natural course of neuropathy. Neuropathy tends to be slowly progressive, especially if alcohol continues to be consumed. Management is symptomatic.     PLAN/RECOMMENDATIONS:  NCS/EMG bilateral legs Increase Lyrica to 100mg  BID Trial of OTC lidocaine ointment Start PT for balance training Alcohol cessation strongly advised Counseling against driving if unable to safely control the pedals  Return to clinic in 6 months.   Thank you for allowing me to participate in patient's care.  If I can answer any additional questions, I would be pleased to do so.    Sincerely,    Lylian Sanagustin K. Allena Katz, DO

## 2022-06-02 NOTE — Patient Instructions (Addendum)
Strongly encouraged to stop alcohol use  Increase Lyrica 100mg  twice daily  You can try over-the-counter lidocaine ointment (Aspercream, Salonpas) to your feet  Referral for physical therapy for balance training  Nerve testing of the legs  Counseled against driving if unable to safely control pedals  Return to clinic in 6 months  ELECTROMYOGRAM AND NERVE CONDUCTION STUDIES (EMG/NCS) INSTRUCTIONS  How to Prepare The neurologist conducting the EMG will need to know if you have certain medical conditions. Tell the neurologist and other EMG lab personnel if you: Have a pacemaker or any other electrical medical device Take blood-thinning medications Have hemophilia, a blood-clotting disorder that causes prolonged bleeding Bathing Take a shower or bath shortly before your exam in order to remove oils from your skin. Don't apply lotions or creams before the exam.  What to Expect You'll likely be asked to change into a hospital gown for the procedure and lie down on an examination table. The following explanations can help you understand what will happen during the exam.  Electrodes. The neurologist or a technician places surface electrodes at various locations on your skin depending on where you're experiencing symptoms. Or the neurologist may insert needle electrodes at different sites depending on your symptoms.  Sensations. The electrodes will at times transmit a tiny electrical current that you may feel as a twinge or spasm. The needle electrode may cause discomfort or pain that usually ends shortly after the needle is removed. If you are concerned about discomfort or pain, you may want to talk to the neurologist about taking a short break during the exam.  Instructions. During the needle EMG, the neurologist will assess whether there is any spontaneous electrical activity when the muscle is at rest - activity that isn't present in healthy muscle tissue - and the degree of activity when you  slightly contract the muscle.  He or she will give you instructions on resting and contracting a muscle at appropriate times. Depending on what muscles and nerves the neurologist is examining, he or she may ask you to change positions during the exam.  After your EMG You may experience some temporary, minor bruising where the needle electrode was inserted into your muscle. This bruising should fade within several days. If it persists, contact your primary care doctor.

## 2022-06-03 ENCOUNTER — Other Ambulatory Visit: Payer: Self-pay

## 2022-06-03 MED ORDER — ONDANSETRON 4 MG PO TBDP
4.0000 mg | ORAL_TABLET | Freq: Three times a day (TID) | ORAL | 1 refills | Status: DC | PRN
  Filled 2022-06-03: qty 20, 7d supply, fill #0
  Filled 2022-07-12: qty 18, 15d supply, fill #1
  Filled 2022-08-28: qty 2, 1d supply, fill #2

## 2022-06-12 ENCOUNTER — Other Ambulatory Visit: Payer: Self-pay

## 2022-06-12 ENCOUNTER — Emergency Department
Admission: EM | Admit: 2022-06-12 | Discharge: 2022-06-12 | Disposition: A | Discharge status: Home / Self Care | Payer: 59 | Attending: Emergency Medicine | Admitting: Emergency Medicine

## 2022-06-12 ENCOUNTER — Encounter: Payer: Self-pay | Admitting: Emergency Medicine

## 2022-06-12 DIAGNOSIS — I10 Essential (primary) hypertension: Secondary | ICD-10-CM | POA: Insufficient documentation

## 2022-06-12 DIAGNOSIS — K029 Dental caries, unspecified: Secondary | ICD-10-CM | POA: Insufficient documentation

## 2022-06-12 DIAGNOSIS — K0889 Other specified disorders of teeth and supporting structures: Secondary | ICD-10-CM | POA: Diagnosis present

## 2022-06-12 DIAGNOSIS — K047 Periapical abscess without sinus: Secondary | ICD-10-CM

## 2022-06-12 MED ORDER — CLINDAMYCIN HCL 300 MG PO CAPS
300.0000 mg | ORAL_CAPSULE | Freq: Three times a day (TID) | ORAL | 0 refills | Status: AC
  Filled 2022-06-12: qty 30, 10d supply, fill #0

## 2022-06-12 NOTE — ED Provider Notes (Signed)
   [facility] Provider Note    Event Date/Time   First MD Initiated Contact with Patient 06/12/22 1417     (approximate)  History   Chief Complaint: Jaw Pain  HPI  Tierre Gerard is a 36 y.o. female with a past medical history of gastric reflux, hypertension, anemia, presents to the emergency department for right upper dental pain.  According to the patient for the past week or so she has been experiencing pain in the right upper teeth much worse with chewing.  States the pain is gotten worse she cannot get in with her dentist for another week so she came to the emergency department for evaluation.  Denies any fever.  Physical Exam   Triage Vital Signs: ED Triage Vitals  Enc Vitals Group     BP 06/12/22 1327 103/81     Pulse Rate 06/12/22 1327 (!) 106     Resp 06/12/22 1327 18     Temp 06/12/22 1327 98.1 F (36.7 C)     Temp Source 06/12/22 1327 Oral     SpO2 06/12/22 1327 100 %     Weight 06/12/22 1329 145 lb (65.8 kg)     Height 06/12/22 1329 5\' 8"  (1.727 m)     Head Circumference --      Peak Flow --      Pain Score 06/12/22 1327 4     Pain Loc --      Pain Edu? --      Excl. in GC? --     Most recent vital signs: Vitals:   06/12/22 1327  BP: 103/81  Pulse: (!) 106  Resp: 18  Temp: 98.1 F (36.7 C)  SpO2: 100%    General: Awake, no distress.  CV:  Good peripheral perfusion.   Resp:  Normal effort.   Other:  No abscess seen on my evaluation.  No facial erythema or swelling.  Patient does have tenderness to palpation of the right upper premolar and first molar.   ED Results / Procedures / Treatments   MEDICATIONS ORDERED IN ED: Medications - No data to display   IMPRESSION / MDM / ASSESSMENT AND PLAN / ED COURSE  I reviewed the triage vital signs and the nursing notes.  Patient's presentation is most consistent with acute illness / injury with system symptoms.  Patient presents emergency department for right upper  dental pain patient has several teeth that could be infected in the right upper area she does have tenderness over the gums but no abscess palpated.  No facial swelling or erythema.  We will place patient on clindamycin 3 times daily we will have the patient follow-up with her dentist which she has scheduled in 1 week.  I discussed return precautions.  Patient agreeable to plan of care.  FINAL CLINICAL IMPRESSION(S) / ED DIAGNOSES   Dental caries Dental pain   Note:  This document was prepared using Dragon voice recognition software and may include unintentional dictation errors.   06/14/22, MD 06/12/22 1447

## 2022-06-12 NOTE — ED Triage Notes (Signed)
Patient c/o right sided jaw pain that radiates into face and mouth. Possible infected tooth from cracked tooth. Attempted to see dentist but unable to be seen for 1.5 weeks.

## 2022-06-12 NOTE — ED Notes (Signed)
See triage note  Presents with possible

## 2022-06-26 ENCOUNTER — Ambulatory Visit (INDEPENDENT_AMBULATORY_CARE_PROVIDER_SITE_OTHER): Payer: 59 | Admitting: Neurology

## 2022-06-26 DIAGNOSIS — G621 Alcoholic polyneuropathy: Secondary | ICD-10-CM | POA: Diagnosis not present

## 2022-06-26 DIAGNOSIS — R2681 Unsteadiness on feet: Secondary | ICD-10-CM

## 2022-06-26 NOTE — Procedures (Signed)
[facility] Neurology  [address] [place], [address]  [place], [place] [address] Tel: [phone] Fax:  [phone] Test Date:  06/26/2022  Patient: Sandra Pearson DOB: 1986-04-06 Physician: Nita Sickle, DO  Sex: Female Height: 5\' 8"  Ref Phys: , DO  ID#: Nita Sickle   Technician:    Patient Complaints: This is a 36 year old female referred for evaluation of bilateral leg numbness.  NCV & EMG Findings: Extensive electrodiagnostic testing of the right lower extremity and additional studies of the left shows:  Bilateral sural and superficial peroneal sensory responses are absent. Bilateral peroneal and tibial motor responses are within normal limits. Bilateral tibial H reflex studies are within normal limits. Chronic motor axonal loss changes are seen affecting bilateral flexor digitorum longus and medial gastrocnemius muscles.  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   ___________________________ 20, DO    Nerve Conduction Studies Anti Sensory Summary Table   Stim Site NR Peak (ms) Norm Peak (ms) O-P Amp (V) Norm O-P Amp  Left Sup Peroneal Anti Sensory (Ant Lat Mall)  33C  12 cm NR  <4.5  >5  Right Sup Peroneal Anti Sensory (Ant Lat Mall)  33C  12 cm NR  <4.5  >5  Left Sural Anti Sensory (Lat Mall)  33C  Calf NR  <4.5  >5  Right Sural Anti Sensory (Lat Mall)  33C  Calf NR  <4.5  >5   Motor Summary Table   Stim Site NR Onset (ms) Norm Onset (ms) O-P Amp (mV) Norm O-P Amp Site1 Site2 Delta-0 (ms) Dist (cm) Vel (m/s) Norm Vel (m/s)  Left Peroneal Motor (Ext Dig Brev)  33C  Ankle    3.7 <5.5 3.2 >3 B Fib Ankle 7.9 39.0 49 >40  B Fib    11.6  3.0  Poplt B Fib 1.6 8.0 50 >40  Poplt    13.2  2.9         Right Peroneal Motor (Ext Dig Brev)  33C  Ankle    3.6 <5.5 3.6 >3 B Fib Ankle 7.8 38.0 49 >40  B Fib    11.4  3.6  Poplt B  Fib 1.5 8.0 53 >40  Poplt    12.9  3.5         Left Tibial Motor (Abd Hall Brev)  33C  Ankle    3.9 <6.0 16.8 >8 Knee Ankle 8.1 43.0 53 >40  Knee    12.0  15.5         Right Tibial Motor (Abd Hall Brev)  33C  Ankle    3.4 <6.0 14.3 >8 Knee Ankle 9.0 44.0 49 >40  Knee    12.4  9.2          H Reflex Studies   NR H-Lat (ms) Lat Norm (ms) L-R H-Lat (ms)  Left Tibial (Gastroc)  33C     34.15 <35 0.54  Right Tibial (Gastroc)  33C     33.61 <35 0.54   EMG   Side Muscle Ins Act Fibs Fasc Recrt Dur. Amp. Poly. Activation Comment  Right AntTibialis Nml Nml Nml Nml Nml Nml Nml Nml N/A  Right GluteusMed Nml Nml Nml Nml Nml Nml Nml Nml N/A  Right BicepsFemS Nml Nml Nml Nml Nml Nml Nml Nml N/A  Right Gastroc Nml Nml Nml 1- 1+ 1+ 1+ Nml N/A  Right Flex Dig Long Nml Nml Nml 1- 1+ 1+ 1+ Nml  N/A  Right RectFemoris Nml Nml Nml Nml Nml Nml Nml Nml N/A  Left BicepsFemS Nml Nml Nml Nml Nml Nml Nml Nml N/A  Left AntTibialis Nml Nml Nml Nml Nml Nml Nml Nml N/A  Left Gastroc Nml Nml Nml 1- 1+ 1+ 1+ Nml N/A  Left Flex Dig Long Nml Nml Nml 1- 1+ 1+ 1+ Nml N/A  Left RectFemoris Nml Nml Nml Nml Nml Nml Nml Nml N/A      Waveforms:

## 2022-07-02 ENCOUNTER — Other Ambulatory Visit: Payer: Self-pay

## 2022-07-02 ENCOUNTER — Encounter: Payer: Self-pay | Admitting: Oncology

## 2022-07-03 ENCOUNTER — Other Ambulatory Visit: Payer: Self-pay

## 2022-07-03 ENCOUNTER — Encounter: Payer: Self-pay | Admitting: Oncology

## 2022-07-03 ENCOUNTER — Telehealth (HOSPITAL_COMMUNITY): Payer: Self-pay | Admitting: Pharmacy Technician

## 2022-07-03 NOTE — Telephone Encounter (Signed)
Patient Advocate Encounter   Received notification that prior authorization for Pregabalin 100MG  capsules is required.   PA submitted on 07/03/2022 Key [id number]  Status is pending       09/02/2022, [facility] Pharm[facility] Patient Advocate Specialist [facility] Patient Advocate Team Direct Number: [phone]  Fax: [phone]

## 2022-07-04 NOTE — Telephone Encounter (Signed)
Patient Advocate Encounter  Received notification that the request for prior authorization for Pregabalin 100MG  capsules  has been denied due to You do not meet the requirements of your plan. Your plan covers this drug when it is being prescribed for an FDA-approved or compendia supported indication. Your request has been denied based on the information we have. , [facility] Pharm[facility] Patient Advocate Specialist [facility] Patient Advocate Team Direct Number: [phone]  Fax: [phone]

## 2022-07-11 NOTE — Telephone Encounter (Signed)
Please let pt know Lyrica has been denied.  We can try another medication, such as Cymbalta 30mg , if she is interested.

## 2022-07-12 ENCOUNTER — Other Ambulatory Visit: Payer: Self-pay | Admitting: Nurse Practitioner

## 2022-07-12 DIAGNOSIS — K2100 Gastro-esophageal reflux disease with esophagitis, without bleeding: Secondary | ICD-10-CM

## 2022-07-14 ENCOUNTER — Other Ambulatory Visit: Payer: Self-pay

## 2022-07-14 MED ORDER — FAMOTIDINE 40 MG PO TABS
40.0000 mg | ORAL_TABLET | Freq: Every evening | ORAL | 0 refills | Status: DC
  Filled 2022-07-14 – 2022-07-21 (×2): qty 30, 30d supply, fill #0

## 2022-07-14 NOTE — Telephone Encounter (Signed)
Called patient on both home and cell. Unable to leave on home phone as phone kept ringing. Did leave a message on cell phone for a call back.

## 2022-07-15 ENCOUNTER — Other Ambulatory Visit: Payer: Self-pay

## 2022-07-15 NOTE — Telephone Encounter (Signed)
Called patient on house phone and unable to leave a message due to no voicemail. Also, called patients cell phone and left a message for a call back.

## 2022-07-16 ENCOUNTER — Encounter: Payer: Self-pay | Admitting: Oncology

## 2022-07-16 NOTE — Telephone Encounter (Signed)
Called patient on home phone and unable to leave a message. Called patient on cell phone and left a message for a call back. Will now send a mychart message to patient.

## 2022-07-18 ENCOUNTER — Ambulatory Visit: Payer: 59 | Admitting: Neurology

## 2022-07-21 ENCOUNTER — Encounter: Payer: Self-pay | Admitting: Nurse Practitioner

## 2022-07-21 ENCOUNTER — Ambulatory Visit: Payer: 59 | Attending: Nurse Practitioner | Admitting: Nurse Practitioner

## 2022-07-21 ENCOUNTER — Encounter: Payer: Self-pay | Admitting: Oncology

## 2022-07-21 ENCOUNTER — Other Ambulatory Visit: Payer: Self-pay

## 2022-07-21 VITALS — BP 95/66 | HR 86 | Temp 98.2°F | Ht 68.0 in | Wt 139.4 lb

## 2022-07-21 DIAGNOSIS — L219 Seborrheic dermatitis, unspecified: Secondary | ICD-10-CM | POA: Diagnosis not present

## 2022-07-21 DIAGNOSIS — R053 Chronic cough: Secondary | ICD-10-CM

## 2022-07-21 DIAGNOSIS — K2100 Gastro-esophageal reflux disease with esophagitis, without bleeding: Secondary | ICD-10-CM | POA: Diagnosis not present

## 2022-07-21 DIAGNOSIS — Z23 Encounter for immunization: Secondary | ICD-10-CM | POA: Diagnosis not present

## 2022-07-21 DIAGNOSIS — N6315 Unspecified lump in the right breast, overlapping quadrants: Secondary | ICD-10-CM

## 2022-07-21 MED ORDER — OMEPRAZOLE 40 MG PO CPDR
40.0000 mg | DELAYED_RELEASE_CAPSULE | Freq: Every day | ORAL | 1 refills | Status: DC
  Filled 2022-07-21 – 2022-08-28 (×2): qty 30, 30d supply, fill #0
  Filled 2022-09-26: qty 30, 30d supply, fill #1
  Filled 2022-10-24 (×2): qty 30, 30d supply, fill #2
  Filled 2022-11-24 – 2023-02-27 (×2): qty 30, 30d supply, fill #3
  Filled 2023-03-30: qty 30, 30d supply, fill #4
  Filled 2023-04-27: qty 30, 30d supply, fill #5

## 2022-07-21 NOTE — Progress Notes (Signed)
Assessment & Plan:  Sandra Pearson was seen today for hypertension.  Diagnoses and all orders for this visit:  Chronic cough -     DG Chest 2 View; Future  Seborrheic dermatitis OTC selsun blue  Gastroesophageal reflux disease with esophagitis without hemorrhage Continue pepcid daily INSTRUCTIONS: Avoid GERD Triggers: acidic, spicy or fried foods, caffeine, coffee, sodas,  alcohol and chocolate.    Breast lump on right side at 9 o'clock position -     MM Digital Diagnostic Bilat; Future  Need for immunization against influenza -     Flu Vaccine QUAD 49mo+IM (Fluarix, Fluzone & Alfiuria Quad PF)    Patient has been counseled on age-appropriate routine health concerns for screening and prevention. These are reviewed and up-to-date. Referrals have been placed accordingly. Immunizations are up-to-date or declined.    Subjective:   Chief Complaint  Patient presents with   Hypertension   HPI Sandra Pearson 36 y.o. female presents to office today for follow up to HTN and with complaints of chronic cough.  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 pylori infection (05/2021), Hypertension, Hypomagnesemia, and Takes iron supplements.   Cough Onset several months ago. At one point sputum was thick and green but this has resolved. She cut back on smoking last year and now only smoked 5-8 cigarettes per day. Cough is worse in the mornings upon awakening and tapers off during the day. Started smoking 16 years ago and used to smoke up to 1.5 packs per day. Quit drinking alcohol 2-3 months ago.   HTN Blood pressure is well controlled. She does not take any antihypertensives.  BP Readings from Last 3 Encounters:  07/21/22 95/66  06/12/22 103/81  06/02/22 130/86    Seborrhea: Patient complains of seborrheic dermatitis. Patient complains of itching, scaling, rash in the scalp.  Symptoms have been ongoing for about several months.  Previous treatment has included None. On exam there are no rashes, lesions or scaling areas visible  the scalp   Review of Systems  Constitutional:  Negative for fever, malaise/fatigue and weight loss.  HENT: Negative.  Negative for nosebleeds.   Eyes: Negative.  Negative for blurred vision, double vision and photophobia.  Respiratory:  Positive for cough. Negative for hemoptysis, sputum production, shortness of breath and wheezing.   Cardiovascular: Negative.  Negative for chest pain, palpitations and leg swelling.  Gastrointestinal: Negative.  Negative for heartburn, nausea and vomiting.  Musculoskeletal: Negative.  Negative for myalgias.  Skin:  Positive for itching and rash.  Neurological: Negative.  Negative for dizziness, focal weakness, seizures and headaches.  Psychiatric/Behavioral: Negative.  Negative for suicidal ideas.     Past Medical History:  Diagnosis Date   Acute necrotizing pancreatitis 05/2021   Anemia    was iron deficiency but labs improved   GERD (gastroesophageal reflux disease)    with esophagitis   H. pylori infection 05/2021   stopped medication for this as it may have effected pancreatitis   History of blood transfusion    Hypertension    has never been treated for htn   Hypomagnesemia    Takes iron supplements     Past Surgical History:  Procedure Laterality Date   COLONOSCOPY N/A 03/15/2021   Procedure: COLONOSCOPY;  Surgeon: Pasty Spillers, MD;  Location: [facility] ENDOSCOPY;  Service: Endoscopy;  Laterality: N/A;   ESOPHAGOGASTRODUODENOSCOPY N/A 03/15/2021   Procedure: ESOPHAGOGASTRODUODENOSCOPY (EGD);  Surgeon: Pasty Spillers, MD;  Location: [facility] ENDOSCOPY;  Service:  Endoscopy;  Laterality: N/A;   MANDIBLE FRACTURE SURGERY  1999   unsure if any metal. does not think so    Family History  Problem Relation Age of Onset   Hepatitis C Mother    Heart Problems Mother        Needed open heart surgery due to accident   Hepatitis C Father      Social History Reviewed with no changes to be made today.   Outpatient Medications Prior to Visit  Medication Sig Dispense Refill   acetaminophen (TYLENOL) 500 MG tablet Take 2 tablets (1,000 mg total) by mouth every 6 (six) hours as needed for mild pain.     famotidine (PEPCID) 40 MG tablet Take 1 tablet (40 mg total) by mouth every evening. 30 tablet 0   ferrous sulfate 325 (65 FE) MG tablet Take 1 tablet (325 mg total) by mouth daily. 90 tablet 1   ondansetron (ZOFRAN-ODT) 4 MG disintegrating tablet Dissolve 1 tablet (4 mg total) by mouth once every 8 (eight) hours as needed for nausea or vomiting. 20 tablet 1   pregabalin (LYRICA) 100 MG capsule Take 1 capsule (100 mg total) by mouth 2 (two) times daily. 60 capsule 5   thiamine 100 MG tablet Take 1 tablet (100 mg total) by mouth daily. 30 tablet 0   omeprazole (PRILOSEC) 40 MG capsule Take 1 capsule (40 mg total) by mouth daily. 90 capsule 1   No facility-administered medications prior to visit.    No Known Allergies     Objective:    BP 95/66   Pulse 86   Temp 98.2 F (36.8 C) (Oral)   Ht 5\' 8"  (1.727 m)   Wt 139 lb 6.4 oz (63.2 kg)   LMP 06/07/2022 (Exact Date)   SpO2 98%   BMI 21.20 kg/m  Wt Readings from Last 3 Encounters:  07/21/22 139 lb 6.4 oz (63.2 kg)  06/12/22 145 lb (65.8 kg)  06/02/22 145 lb (65.8 kg)    Physical Exam Vitals and nursing note reviewed.  Constitutional:      Appearance: She is well-developed.  HENT:     Head: Normocephalic and atraumatic.  Cardiovascular:     Rate and Rhythm: Normal rate and regular rhythm.     Heart sounds: Normal heart sounds. No murmur heard.    No friction rub. No gallop.  Pulmonary:     Effort: Pulmonary effort is normal. No tachypnea or respiratory distress.     Breath sounds: Normal breath sounds. No decreased breath sounds, wheezing, rhonchi or rales.  Chest:     Chest wall: No tenderness.  Breasts:    Right: Mass present. No nipple discharge, skin  change or tenderness.     Left: Normal.    Abdominal:     General: Bowel sounds are normal.     Palpations: Abdomen is soft.  Musculoskeletal:        General: Normal range of motion.     Cervical back: Normal range of motion.  Lymphadenopathy:     Upper Body:     Right upper body: No supraclavicular, axillary or pectoral adenopathy.     Left upper body: No supraclavicular, axillary or pectoral adenopathy.  Skin:    General: Skin is warm and dry.  Neurological:     Mental Status: She is alert and oriented to person, place, and time.     Coordination: Coordination normal.  Psychiatric:        Behavior: Behavior normal. Behavior is cooperative.  Thought Content: Thought content normal.        Judgment: Judgment normal.          Patient has been counseled extensively about nutrition and exercise as well as the importance of adherence with medications and regular follow-up. The patient was given clear instructions to go to ER or return to medical center if symptoms don't improve, worsen or new problems develop. The patient verbalized understanding.   Follow-up: Return in about 6 months (around 01/19/2023) for Physical.   Gildardo Pounds, FNP-BC [facility] and [facility] [place], [place]   07/21/2022, 4:22 PM

## 2022-07-21 NOTE — Telephone Encounter (Signed)
Pt called in stating she still wants to keep taking the Lyrica. She will have [place] insurance beginning 07/27/22 and they should cover it. She does not want to change medications.

## 2022-07-23 ENCOUNTER — Other Ambulatory Visit: Payer: Self-pay

## 2022-07-23 ENCOUNTER — Other Ambulatory Visit: Payer: Self-pay | Admitting: Nurse Practitioner

## 2022-07-23 DIAGNOSIS — N6315 Unspecified lump in the right breast, overlapping quadrants: Secondary | ICD-10-CM

## 2022-07-23 MED ORDER — AMOXICILLIN 500 MG PO CAPS
500.0000 mg | ORAL_CAPSULE | Freq: Three times a day (TID) | ORAL | 0 refills | Status: DC
  Filled 2022-07-23: qty 21, 7d supply, fill #0

## 2022-07-28 ENCOUNTER — Other Ambulatory Visit: Payer: Self-pay

## 2022-07-31 ENCOUNTER — Other Ambulatory Visit: Payer: Self-pay

## 2022-07-31 ENCOUNTER — Encounter: Payer: Self-pay | Admitting: Oncology

## 2022-08-01 ENCOUNTER — Other Ambulatory Visit: Payer: Self-pay

## 2022-08-28 ENCOUNTER — Encounter: Payer: Self-pay | Admitting: Oncology

## 2022-08-28 ENCOUNTER — Other Ambulatory Visit: Payer: Self-pay | Admitting: Family Medicine

## 2022-08-28 ENCOUNTER — Other Ambulatory Visit: Payer: Self-pay

## 2022-08-28 DIAGNOSIS — K2100 Gastro-esophageal reflux disease with esophagitis, without bleeding: Secondary | ICD-10-CM

## 2022-08-28 MED ORDER — FAMOTIDINE 40 MG PO TABS
40.0000 mg | ORAL_TABLET | Freq: Every evening | ORAL | 0 refills | Status: DC
  Filled 2022-08-28: qty 30, 30d supply, fill #0

## 2022-09-25 ENCOUNTER — Ambulatory Visit
Admission: RE | Admit: 2022-09-25 | Discharge: 2022-09-25 | Disposition: A | Discharge status: Home / Self Care | Payer: Self-pay | Source: Ambulatory Visit | Attending: Nurse Practitioner | Admitting: Nurse Practitioner

## 2022-09-25 ENCOUNTER — Encounter: Payer: Self-pay | Admitting: Oncology

## 2022-09-25 DIAGNOSIS — R053 Chronic cough: Secondary | ICD-10-CM

## 2022-09-26 ENCOUNTER — Other Ambulatory Visit: Payer: Self-pay | Admitting: Family Medicine

## 2022-09-26 ENCOUNTER — Other Ambulatory Visit: Payer: Self-pay

## 2022-09-26 DIAGNOSIS — K2100 Gastro-esophageal reflux disease with esophagitis, without bleeding: Secondary | ICD-10-CM

## 2022-09-26 MED ORDER — FAMOTIDINE 40 MG PO TABS
40.0000 mg | ORAL_TABLET | Freq: Every evening | ORAL | 2 refills | Status: DC
  Filled 2022-09-26: qty 30, 30d supply, fill #0
  Filled 2022-10-24 (×2): qty 30, 30d supply, fill #1
  Filled 2022-11-24 – 2023-02-27 (×2): qty 30, 30d supply, fill #2

## 2022-09-26 MED ORDER — ONDANSETRON 4 MG PO TBDP
4.0000 mg | ORAL_TABLET | Freq: Three times a day (TID) | ORAL | 1 refills | Status: DC | PRN
  Filled 2022-09-26: qty 20, 7d supply, fill #0
  Filled 2022-11-24: qty 18, 20d supply, fill #1
  Filled 2022-12-30: qty 18, 20d supply, fill #2

## 2022-10-09 ENCOUNTER — Encounter: Payer: Self-pay | Admitting: Oncology

## 2022-10-09 ENCOUNTER — Ambulatory Visit
Admission: RE | Admit: 2022-10-09 | Discharge: 2022-10-09 | Disposition: A | Discharge status: Home / Self Care | Payer: Commercial Managed Care - HMO | Source: Ambulatory Visit | Attending: Nurse Practitioner | Admitting: Nurse Practitioner

## 2022-10-09 ENCOUNTER — Ambulatory Visit
Admission: RE | Admit: 2022-10-09 | Discharge: 2022-10-09 | Disposition: A | Discharge status: Home / Self Care | Payer: 59 | Source: Ambulatory Visit | Attending: Nurse Practitioner | Admitting: Nurse Practitioner

## 2022-10-09 DIAGNOSIS — N6315 Unspecified lump in the right breast, overlapping quadrants: Secondary | ICD-10-CM

## 2022-10-24 ENCOUNTER — Other Ambulatory Visit: Payer: Self-pay

## 2022-11-24 ENCOUNTER — Other Ambulatory Visit: Payer: Self-pay

## 2022-11-24 ENCOUNTER — Encounter: Payer: Self-pay | Admitting: Oncology

## 2022-11-24 ENCOUNTER — Other Ambulatory Visit: Payer: Self-pay | Admitting: Neurology

## 2022-11-24 DIAGNOSIS — G621 Alcoholic polyneuropathy: Secondary | ICD-10-CM

## 2022-11-25 ENCOUNTER — Other Ambulatory Visit: Payer: Self-pay | Admitting: Neurology

## 2022-11-25 DIAGNOSIS — G621 Alcoholic polyneuropathy: Secondary | ICD-10-CM

## 2022-11-26 ENCOUNTER — Other Ambulatory Visit: Payer: Self-pay

## 2022-11-26 ENCOUNTER — Encounter: Payer: Self-pay | Admitting: Nurse Practitioner

## 2022-11-28 ENCOUNTER — Encounter: Payer: Self-pay | Admitting: Oncology

## 2022-11-28 ENCOUNTER — Other Ambulatory Visit: Payer: Self-pay

## 2022-11-28 ENCOUNTER — Other Ambulatory Visit: Payer: Self-pay | Admitting: Neurology

## 2022-11-28 DIAGNOSIS — G621 Alcoholic polyneuropathy: Secondary | ICD-10-CM

## 2022-12-01 ENCOUNTER — Other Ambulatory Visit: Payer: Self-pay

## 2022-12-03 ENCOUNTER — Other Ambulatory Visit: Payer: Self-pay

## 2022-12-03 ENCOUNTER — Encounter: Payer: Self-pay | Admitting: Neurology

## 2022-12-03 ENCOUNTER — Ambulatory Visit: Payer: 59 | Admitting: Neurology

## 2022-12-03 ENCOUNTER — Encounter: Payer: Self-pay | Admitting: Oncology

## 2022-12-03 DIAGNOSIS — G621 Alcoholic polyneuropathy: Secondary | ICD-10-CM | POA: Diagnosis not present

## 2022-12-03 DIAGNOSIS — R69 Illness, unspecified: Secondary | ICD-10-CM | POA: Diagnosis not present

## 2022-12-03 MED ORDER — PREGABALIN 100 MG PO CAPS
100.0000 mg | ORAL_CAPSULE | Freq: Two times a day (BID) | ORAL | 5 refills | Status: DC
  Filled 2022-12-03: qty 60, 30d supply, fill #0
  Filled 2022-12-30: qty 60, 30d supply, fill #1
  Filled 2023-01-29: qty 60, 30d supply, fill #2
  Filled 2023-02-27: qty 60, 30d supply, fill #3
  Filled 2023-03-30: qty 60, 30d supply, fill #4
  Filled 2023-04-27: qty 60, 30d supply, fill #5

## 2022-12-03 NOTE — Patient Instructions (Signed)
It was lovely to see you today!  I am so proud of your life style changes and abstaining from alcohol.  Continue Lyrica 100mg  twice daily  Keep up the great work!  I will see you back in 1 year

## 2022-12-03 NOTE — Progress Notes (Signed)
Follow-up Visit   Date: 12/03/2022    Sandra Pearson MRN: [id number] DOB: 06/20/1986    Sandra Pearson is a 37 y.o. right-handed Caucasian female with GERD, alcohol dependence, tobacco use, and iron deficient anemia returning to the clinic for follow-up of alcohol-induced neuropathy.  The patient was accompanied to the clinic by self.   IMPRESSION/PLAN: Alcohol-induced peripheral neuropathy, improved pain and imbalance since abstaining from alcohol (05/2022).  She reports marked improvement in overall well-being and I praised her for committing to being sober.  I will keep her on Lyrica 100mg  twice daily and may consider tapering in the future.  Balance has also significantly improved compared to prior visit.  Home balance exercises discussed.  Patient educated on daily foot inspection, fall prevention, and safety precautions around the home.   Return to clinic in 1 year  --------------------------------------------- History of present illness: Starting around 2020, she began having shooting pain, stinging/stabbing, and numbness involving bilateral feet, which has gradually extended into the lower legs up to the knees. Symptoms are constant with episodic severe pain.  No significant numbness/tingling of the hands. She endorses difficulty with walking long distance and has poor balance.  She walks with a cane as needed, no falls. No radicular back pain.  She has difficulty manipulating pedals when driving.  She has tried gabapentin 600mg  BID and now on Lyrica 75mg  twice daily.  She has not noticed that either medication has significant alleviated her pain.  She previously drank liquor heavily in her 24s, currently drinks 5-6 beers throughout the week.     UPDATE 12/03/2022:  She is here for 6 month follow-up.  Since her last visit, she had NCS/EMG of the legs which showed severe axonal neuropathy affecting the legs.  She has completely stopped alcohol consumption  since her last visit and has been very happy with how she is feeling now.  She denies the urge for alcohol.  Her legs do not hurt as much as they used to, she feels better, and balance has improved.  She did not complete PT due to co-pay being $77 each visit.  She has not had any falls.   Medications:  Current Outpatient Medications on File Prior to Visit  Medication Sig Dispense Refill   acetaminophen (TYLENOL) 500 MG tablet Take 2 tablets (1,000 mg total) by mouth every 6 (six) hours as needed for mild pain.     famotidine (PEPCID) 40 MG tablet Take 1 tablet (40 mg total) by mouth every evening. 30 tablet 2   ferrous sulfate 325 (65 FE) MG tablet Take 1 tablet (325 mg total) by mouth daily. 90 tablet 1   omeprazole (PRILOSEC) 40 MG capsule Take 1 capsule (40 mg total) by mouth daily. 90 capsule 1   ondansetron (ZOFRAN-ODT) 4 MG disintegrating tablet Dissolve 1 tablet (4 mg total) by mouth once every 8 (eight) hours as needed for nausea or vomiting. 20 tablet 1   thiamine 100 MG tablet Take 1 tablet (100 mg total) by mouth daily. 30 tablet 0   amoxicillin (AMOXIL) 500 MG capsule Take 1 capsule (500 mg total) by mouth 3 (three) times daily until gone. (Patient not taking: Reported on 12/03/2022) 21 capsule 0   folic acid (FOLVITE) 1 MG tablet Take 1 tablet (1 mg total) by mouth daily. 90 tablet 3   No current facility-administered medications on file prior to visit.    Allergies: No Known Allergies  Vital Signs:  BP 108/70   Pulse 99  Ht 5\' 8"  (1.727 m)   Wt 130 lb (59 kg)   SpO2 98%   BMI 19.77 kg/m     Neurological Exam: MENTAL STATUS including orientation to time, place, person, recent and remote memory, attention span and concentration, language, and fund of knowledge is normal.  Speech is not dysarthric.  CRANIAL NERVES:   Pupils equal round and reactive to light.  Normal conjugate, extra-ocular eye movements in all directions of gaze.  No ptosis .  Face is symmetric.   MOTOR:   Motor strength is 5/5 in all extremities, including distally in the toes (improved)  No atrophy, fasciculations or abnormal movements.  No pronator drift.  Tone is normal.    MSRs:                                           Right        Left brachioradialis 2+  2+  biceps 2+  2+  triceps 2+  2+  patellar 1+  1+  ankle jerk 0  0   SENSORY:  Vibration reduced below the ankles and at the great toe, but she is able to perceive.    COORDINATION/GAIT:  Normal finger-to- nose-finger.  Intact rapid alternating movements bilaterally.  Gait narrow based and stable. Stressed and tandem gait intact.  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    Thank you for allowing me to participate in patient's care.  If I can answer any additional questions, I would be pleased to do so.    Sincerely,    Moya Duan K. Posey Pronto, DO

## 2022-12-30 ENCOUNTER — Other Ambulatory Visit: Payer: Self-pay

## 2022-12-30 ENCOUNTER — Encounter: Payer: Self-pay | Admitting: Oncology

## 2022-12-30 ENCOUNTER — Other Ambulatory Visit: Payer: Self-pay | Admitting: Family Medicine

## 2022-12-30 DIAGNOSIS — K2100 Gastro-esophageal reflux disease with esophagitis, without bleeding: Secondary | ICD-10-CM

## 2022-12-30 NOTE — Telephone Encounter (Signed)
Requested medication (s) are due for refill today: yes  Requested medication (s) are on the active medication list: yes    Last refill: 09/26/22  #20  1 refill  Future visit scheduled yes  01/19/23  Notes to clinic:not delegated, please review.  Requested Prescriptions  Pending Prescriptions Disp Refills   ondansetron (ZOFRAN-ODT) 4 MG disintegrating tablet 20 tablet 1    Sig: Dissolve 1 tablet (4 mg total) by mouth once every 8 (eight) hours as needed for nausea or vomiting.     Not Delegated - Gastroenterology: Antiemetics - ondansetron Failed - 12/30/2022  7:53 AM      Failed - This refill cannot be delegated      Passed - AST in normal range and within 360 days    AST  Date Value Ref Range Status  05/03/2022 21 15 - 41 U/L Final         Passed - ALT in normal range and within 360 days    ALT  Date Value Ref Range Status  05/03/2022 21 0 - 44 U/L Final         Passed - Valid encounter within last 6 months    Recent Outpatient Visits           5 months ago Chronic cough   [place], NP   11 months ago Neuropathy   [place] Gildardo Pounds, NP   1 year ago Hospital discharge follow-up   [facility] Gildardo Pounds, NP   1 year ago Polyneuropathy associated with underlying disease [facility])   [place] Gildardo Pounds, NP   1 year ago Encounter for Papanicolaou smear for cervical cancer screening   [place] Gildardo Pounds, NP       Future Appointments             In 2 weeks Gildardo Pounds, NP [place]

## 2022-12-31 ENCOUNTER — Other Ambulatory Visit: Payer: Self-pay

## 2022-12-31 MED ORDER — ONDANSETRON 4 MG PO TBDP
4.0000 mg | ORAL_TABLET | Freq: Three times a day (TID) | ORAL | 0 refills | Status: DC | PRN
  Filled 2022-12-31: qty 18, 20d supply, fill #0

## 2023-01-01 ENCOUNTER — Other Ambulatory Visit: Payer: Self-pay

## 2023-01-07 ENCOUNTER — Other Ambulatory Visit (HOSPITAL_COMMUNITY): Payer: Self-pay

## 2023-01-07 ENCOUNTER — Encounter: Payer: Self-pay | Admitting: Oncology

## 2023-01-19 ENCOUNTER — Encounter: Payer: Self-pay | Admitting: Oncology

## 2023-01-19 ENCOUNTER — Other Ambulatory Visit: Payer: Self-pay

## 2023-01-19 ENCOUNTER — Ambulatory Visit: Payer: 59 | Attending: Nurse Practitioner | Admitting: Nurse Practitioner

## 2023-01-19 ENCOUNTER — Encounter: Payer: Self-pay | Admitting: Nurse Practitioner

## 2023-01-19 VITALS — BP 96/62 | HR 82 | Ht 68.0 in | Wt 132.0 lb

## 2023-01-19 DIAGNOSIS — Z8719 Personal history of other diseases of the digestive system: Secondary | ICD-10-CM

## 2023-01-19 DIAGNOSIS — K2100 Gastro-esophageal reflux disease with esophagitis, without bleeding: Secondary | ICD-10-CM

## 2023-01-19 DIAGNOSIS — D72829 Elevated white blood cell count, unspecified: Secondary | ICD-10-CM

## 2023-01-19 DIAGNOSIS — Z0000 Encounter for general adult medical examination without abnormal findings: Secondary | ICD-10-CM

## 2023-01-19 MED ORDER — ONDANSETRON 4 MG PO TBDP
4.0000 mg | ORAL_TABLET | Freq: Three times a day (TID) | ORAL | 2 refills | Status: DC | PRN
  Filled 2023-01-19: qty 20, 7d supply, fill #0
  Filled 2023-03-30: qty 18, 21d supply, fill #1
  Filled 2023-04-27: qty 18, 21d supply, fill #2
  Filled 2023-06-12 – 2023-06-15 (×3): qty 4, 2d supply, fill #3

## 2023-01-19 NOTE — Progress Notes (Signed)
Assessment & Plan:  Sandra Pearson was seen today for annual exam.  Diagnoses and all orders for this visit:  Encounter for annual physical exam  Gastroesophageal reflux disease with esophagitis without hemorrhage -     ondansetron (ZOFRAN-ODT) 4 MG disintegrating tablet; Dissolve 1 tablet (4 mg total) by mouth once every 8 (eight) hours as needed for nausea or vomiting. INSTRUCTIONS: Avoid GERD Triggers: acidic, spicy or fried foods, caffeine, coffee, sodas,  alcohol and chocolate.    History of pancreatitis -     Ambulatory referral to Gastroenterology -     CMP14+EGFR  Leukocytosis, unspecified type -     CBC with Differential    Patient has been counseled on age-appropriate routine health concerns for screening and prevention. These are reviewed and up-to-date. Referrals have been placed accordingly. Immunizations are up-to-date or declined.    Subjective:   Chief Complaint  Patient presents with   Annual Exam   HPI Sandra Pearson 37 y.o. female presents to office today for annual physical.  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 pylori infection (05/2021), Hypertension, Hypomagnesemia, and Takes iron supplements.   Needs referral for history of necrotizing pancreatitis. History of ETOH abuse, anemia. Alcoholic peripheral neuropathy. She continues to abstain from alcohol.     Review of Systems  Constitutional:  Negative for fever, malaise/fatigue and weight loss.  HENT: Negative.  Negative for nosebleeds.   Eyes: Negative.  Negative for blurred vision, double vision and photophobia.  Respiratory: Negative.  Negative for cough and shortness of breath.   Cardiovascular: Negative.  Negative for chest pain, palpitations and leg swelling.  Gastrointestinal: Negative.  Negative for heartburn, nausea and vomiting.  Genitourinary: Negative.   Musculoskeletal: Negative.  Negative for myalgias.  Skin: Negative.    Neurological: Negative.  Negative for dizziness, focal weakness, seizures and headaches.  Endo/Heme/Allergies: Negative.   Psychiatric/Behavioral: Negative.  Negative for suicidal ideas.     Past Medical History:  Diagnosis Date   Acute necrotizing pancreatitis 05/2021   Anemia    was iron deficiency but labs improved   GERD (gastroesophageal reflux disease)    with esophagitis   H. pylori infection 05/2021   stopped medication for this as it may have effected pancreatitis   History of blood transfusion    Hypertension    has never been treated for htn   Hypomagnesemia    Takes iron supplements     Past Surgical History:  Procedure Laterality Date   COLONOSCOPY N/A 03/15/2021   Procedure: COLONOSCOPY;  Surgeon: Virgel Manifold, MD;  Location: [facility] ENDOSCOPY;  Service: Endoscopy;  Laterality: N/A;   ESOPHAGOGASTRODUODENOSCOPY N/A 03/15/2021   Procedure: ESOPHAGOGASTRODUODENOSCOPY (EGD);  Surgeon: Virgel Manifold, MD;  Location: [facility] ENDOSCOPY;  Service: Endoscopy;  Laterality: N/A;   MANDIBLE FRACTURE SURGERY  1999   unsure if any metal. does not think so    Family History  Problem Relation Age of Onset   Hepatitis C Mother    Heart Problems Mother        Needed open heart surgery due to accident   Hepatitis C Father     Social History Reviewed with no changes to be made today.   Outpatient Medications Prior to Visit  Medication Sig Dispense Refill   acetaminophen (TYLENOL) 500 MG tablet Take 2 tablets (1,000 mg total) by mouth every 6 (six) hours as needed for mild pain.     famotidine (PEPCID) 40 MG tablet Take 1  tablet (40 mg total) by mouth every evening. 30 tablet 2   folic acid (FOLVITE) 1 MG tablet Take 1 tablet (1 mg total) by mouth daily. 90 tablet 3   omeprazole (PRILOSEC) 40 MG capsule Take 1 capsule (40 mg total) by mouth daily. 90 capsule 1   pregabalin (LYRICA) 100 MG capsule Take 1 capsule (100 mg total) by mouth 2 (two) times daily. 60 capsule  5   thiamine 100 MG tablet Take 1 tablet (100 mg total) by mouth daily. 30 tablet 0   ondansetron (ZOFRAN-ODT) 4 MG disintegrating tablet Dissolve 1 tablet (4 mg total) by mouth once every 8 (eight) hours as needed for nausea or vomiting. 20 tablet 0   ferrous sulfate 325 (65 FE) MG tablet Take 1 tablet (325 mg total) by mouth daily. 90 tablet 1   amoxicillin (AMOXIL) 500 MG capsule Take 1 capsule (500 mg total) by mouth 3 (three) times daily until gone. (Patient not taking: Reported on 12/03/2022) 21 capsule 0   No facility-administered medications prior to visit.    No Known Allergies     Objective:    BP 96/62   Pulse 82   Ht 5\' 8"  (1.727 m)   Wt 132 lb (59.9 kg)   SpO2 100%   BMI 20.07 kg/m  Wt Readings from Last 3 Encounters:  01/19/23 132 lb (59.9 kg)  12/03/22 130 lb (59 kg)  07/21/22 139 lb 6.4 oz (63.2 kg)    Physical Exam Constitutional:      Appearance: She is well-developed.  HENT:     Head: Normocephalic and atraumatic.     Right Ear: Hearing, tympanic membrane, ear canal and external ear normal.     Left Ear: Hearing, tympanic membrane, ear canal and external ear normal.     Nose: Nose normal.     Right Turbinates: Not enlarged.     Left Turbinates: Not enlarged.     Mouth/Throat:     Lips: Pink.     Mouth: Mucous membranes are moist.     Dentition: No dental tenderness, gingival swelling, dental abscesses or gum lesions.     Pharynx: No oropharyngeal exudate.  Eyes:     General: No scleral icterus.       Right eye: No discharge.     Extraocular Movements: Extraocular movements intact.     Conjunctiva/sclera: Conjunctivae normal.     Pupils: Pupils are equal, round, and reactive to light.  Neck:     Thyroid: No thyromegaly.     Trachea: No tracheal deviation.  Cardiovascular:     Rate and Rhythm: Normal rate and regular rhythm.     Heart sounds: Normal heart sounds. No murmur heard.    No friction rub.  Pulmonary:     Effort: Pulmonary effort is  normal. No accessory muscle usage or respiratory distress.     Breath sounds: Normal breath sounds. No decreased breath sounds, wheezing, rhonchi or rales.  Abdominal:     General: Bowel sounds are normal. There is no distension.     Palpations: Abdomen is soft. There is no mass.     Tenderness: There is no abdominal tenderness. There is no right CVA tenderness, left CVA tenderness, guarding or rebound.     Hernia: No hernia is present.  Musculoskeletal:        General: No tenderness or deformity. Normal range of motion.     Cervical back: Normal range of motion and neck supple.  Lymphadenopathy:  Cervical: No cervical adenopathy.  Skin:    General: Skin is warm and dry.     Findings: No erythema.  Neurological:     Mental Status: She is alert and oriented to person, place, and time.     Cranial Nerves: No cranial nerve deficit.     Motor: Motor function is intact.     Coordination: Coordination is intact. Coordination normal.     Gait: Gait is intact.     Deep Tendon Reflexes:     Reflex Scores:      Patellar reflexes are 1+ on the right side and 1+ on the left side. Psychiatric:        Attention and Perception: Attention normal.        Mood and Affect: Mood normal.        Speech: Speech normal.        Behavior: Behavior normal.        Thought Content: Thought content normal.        Judgment: Judgment normal.          Patient has been counseled extensively about nutrition and exercise as well as the importance of adherence with medications and regular follow-up. The patient was given clear instructions to go to ER or return to medical center if symptoms don't improve, worsen or new problems develop. The patient verbalized understanding.   Follow-up: Return if symptoms worsen or fail to improve.   Gildardo Pounds, FNP-BC [facility] and [facility] [place], [place]   01/19/2023, 1:22 PM

## 2023-01-20 LAB — CBC WITH DIFFERENTIAL/PLATELET
Basophils Absolute: 0 10*3/uL (ref 0.0–0.2)
Basos: 0 %
EOS (ABSOLUTE): 0.1 10*3/uL (ref 0.0–0.4)
Eos: 1 %
Hematocrit: 39.7 % (ref 34.0–46.6)
Hemoglobin: 12.8 g/dL (ref 11.1–15.9)
Immature Grans (Abs): 0 10*3/uL (ref 0.0–0.1)
Immature Granulocytes: 0 %
Lymphocytes Absolute: 2.4 10*3/uL (ref 0.7–3.1)
Lymphs: 30 %
MCH: 28.8 pg (ref 26.6–33.0)
MCHC: 32.2 g/dL (ref 31.5–35.7)
MCV: 89 fL (ref 79–97)
Monocytes Absolute: 0.6 10*3/uL (ref 0.1–0.9)
Monocytes: 7 %
Neutrophils Absolute: 4.8 10*3/uL (ref 1.4–7.0)
Neutrophils: 62 %
Platelets: 266 10*3/uL (ref 150–450)
RBC: 4.44 x10E6/uL (ref 3.77–5.28)
RDW: 13.6 % (ref 11.7–15.4)
WBC: 8 10*3/uL (ref 3.4–10.8)

## 2023-01-20 LAB — CMP14+EGFR
ALT: 14 IU/L (ref 0–32)
AST: 20 IU/L (ref 0–40)
Albumin/Globulin Ratio: 1.5 (ref 1.2–2.2)
Albumin: 4.3 g/dL (ref 3.9–4.9)
Alkaline Phosphatase: 76 IU/L (ref 44–121)
BUN/Creatinine Ratio: 13 (ref 9–23)
BUN: 11 mg/dL (ref 6–20)
Bilirubin Total: 0.3 mg/dL (ref 0.0–1.2)
CO2: 23 mmol/L (ref 20–29)
Calcium: 9.4 mg/dL (ref 8.7–10.2)
Chloride: 102 mmol/L (ref 96–106)
Creatinine, Ser: 0.84 mg/dL (ref 0.57–1.00)
Globulin, Total: 2.8 g/dL (ref 1.5–4.5)
Glucose: 84 mg/dL (ref 70–99)
Potassium: 4.5 mmol/L (ref 3.5–5.2)
Sodium: 140 mmol/L (ref 134–144)
Total Protein: 7.1 g/dL (ref 6.0–8.5)
eGFR: 93 mL/min/{1.73_m2} (ref >59)

## 2023-01-21 ENCOUNTER — Telehealth: Payer: Self-pay

## 2023-01-21 ENCOUNTER — Other Ambulatory Visit (HOSPITAL_COMMUNITY): Payer: Self-pay

## 2023-01-21 NOTE — Telephone Encounter (Signed)
Patient Advocate Encounter   Received notification from [place] that prior authorization is required for Pregabalin 100MG  capsules   Submitted: 01-21-2023 Key [id number]  Status is pending

## 2023-01-22 ENCOUNTER — Other Ambulatory Visit (HOSPITAL_COMMUNITY): Payer: Self-pay

## 2023-01-22 NOTE — Telephone Encounter (Signed)
Patient Advocate Encounter  Received a fax from [facility] regarding Prior Authorization for Pregabalin 100MG  capsules.   Authorization has been DENIED due to      Full determination letter attached to patient chart

## 2023-01-22 NOTE — Telephone Encounter (Signed)
Please let pt know insurance is not approving Lyrica.  We can try Cymbalta 30mg  instead and see if that still controls her pain.  At her last visit, she was doing better and we talked about reducing Lyrica in the future, so it may be a good time to try a different medication.

## 2023-01-22 NOTE — Telephone Encounter (Signed)
Called patient and left a message for a call back.  

## 2023-01-26 NOTE — Telephone Encounter (Signed)
Called patient and left a message for a call back.  

## 2023-01-27 NOTE — Telephone Encounter (Signed)
Called patient and left a message for a call back. Will send patient a MyChart message.

## 2023-01-29 ENCOUNTER — Other Ambulatory Visit: Payer: Self-pay

## 2023-01-29 ENCOUNTER — Encounter: Payer: Self-pay | Admitting: Oncology

## 2023-01-29 ENCOUNTER — Other Ambulatory Visit: Payer: Self-pay | Admitting: Nurse Practitioner

## 2023-01-29 DIAGNOSIS — F1020 Alcohol dependence, uncomplicated: Secondary | ICD-10-CM

## 2023-01-29 MED ORDER — FOLIC ACID 1 MG PO TABS
1.0000 mg | ORAL_TABLET | Freq: Every day | ORAL | 3 refills | Status: AC
  Filled 2023-01-29: qty 90, 90d supply, fill #0
  Filled 2023-05-28 – 2023-06-12 (×3): qty 30, 30d supply, fill #1
  Filled 2023-08-19: qty 30, 30d supply, fill #2
  Filled 2023-11-18: qty 30, 30d supply, fill #3
  Filled 2024-01-18 (×2): qty 30, 30d supply, fill #4

## 2023-01-29 NOTE — Telephone Encounter (Signed)
Requested medication (s) are due for refill today: Yes  Requested medication (s) are on the active medication list: Yes  Last refill:  01/16/22  Future visit scheduled: No  Notes to clinic:  Prescription has expired.    Requested Prescriptions  Pending Prescriptions Disp Refills   folic acid (FOLVITE) 1 MG tablet 90 tablet 3    Sig: Take 1 tablet (1 mg total) by mouth daily.     Endocrinology:  Vitamins Passed - 01/29/2023 12:16 PM      Passed - Valid encounter within last 12 months    Recent Outpatient Visits           1 week ago Encounter for annual physical exam   [place], Vernia Buff, NP   6 months ago Chronic cough   [place], Vernia Buff, NP   1 year ago Neuropathy   [place] Gildardo Pounds, NP   1 year ago Hospital discharge follow-up   [facility] Gildardo Pounds, NP   1 year ago Polyneuropathy associated with underlying disease [facility])   [place] Gildardo Pounds, NP

## 2023-02-06 NOTE — Telephone Encounter (Signed)
Patient states that she ended up paying out of pocket for it for the medication and got the miscommunication cleared with insurance. Was told by insurance they will cover the prescription at the beginning of next month.

## 2023-02-09 IMAGING — CR DG CHEST 2V
2 series · 2 of 2 positions shown · non-contrast
Comparison: None.

CLINICAL DATA: Chest pain

EXAM:
CHEST - 2 VIEW

[chest lat]
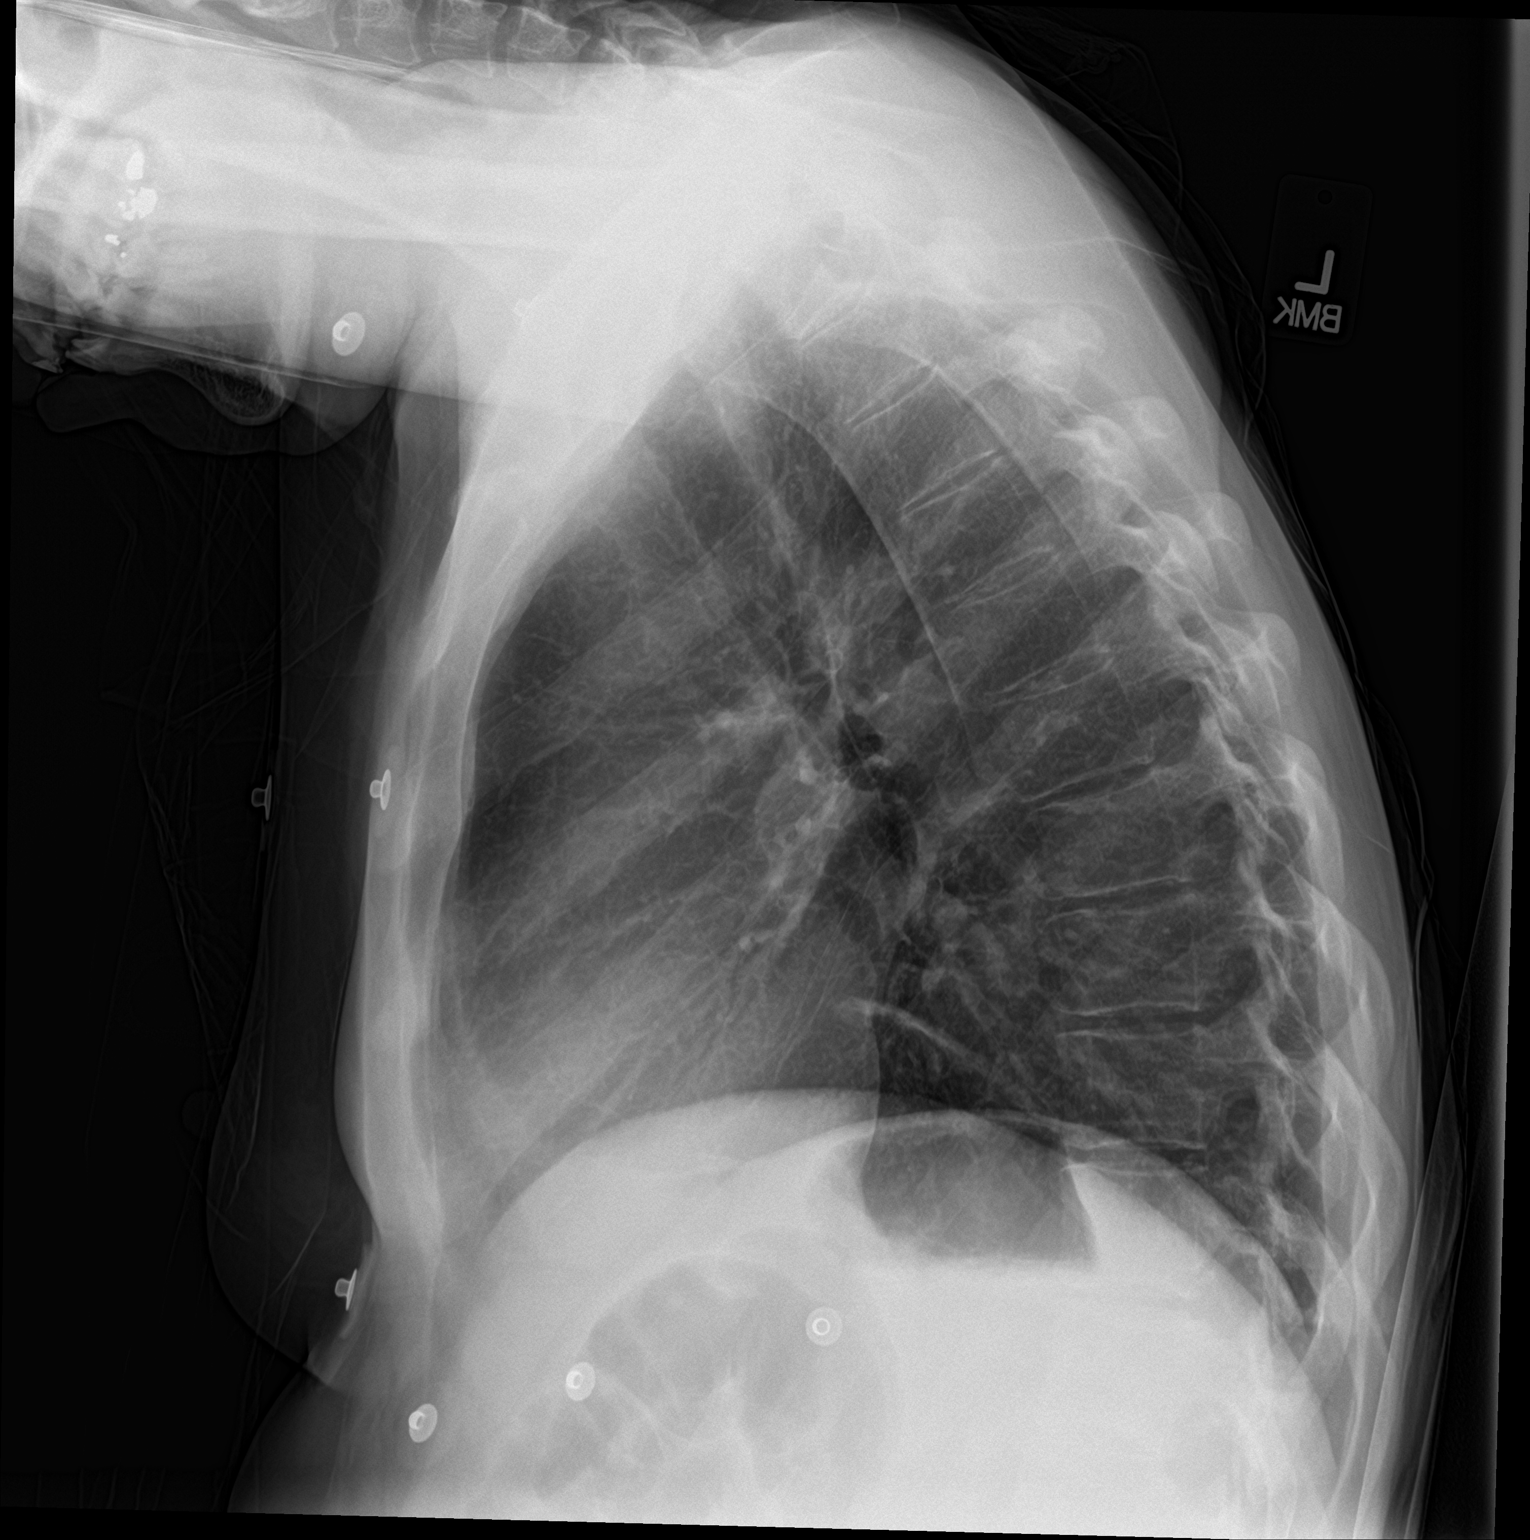

[chest ap]
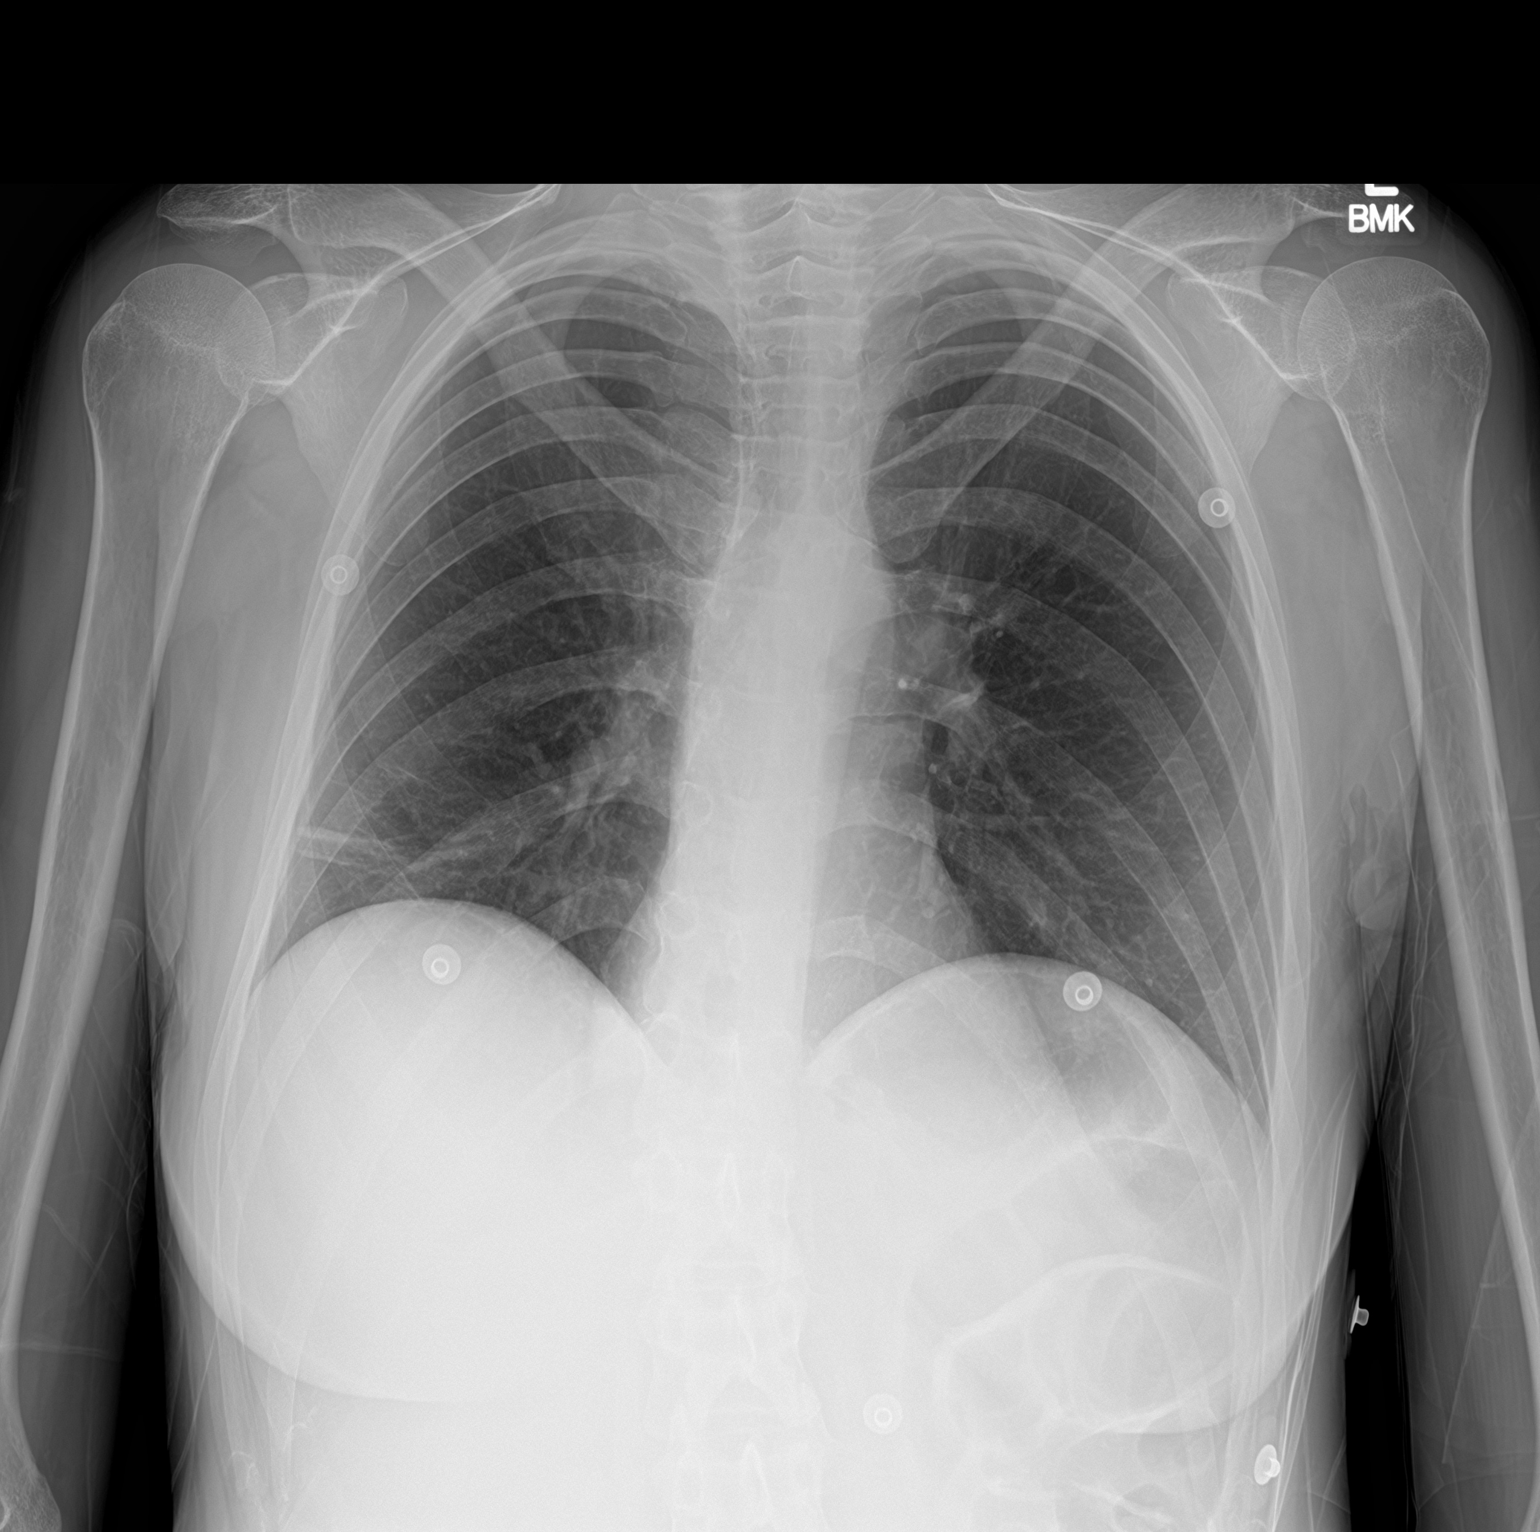

[2 of 2 positions shown; findings below may reference images not displayed]

FINDINGS: The heart size and mediastinal contours are within normal limits.
Both lungs are clear. The visualized skeletal structures are
unremarkable.
IMPRESSION: No active cardiopulmonary disease.

## 2023-02-27 ENCOUNTER — Other Ambulatory Visit: Payer: Self-pay

## 2023-02-27 ENCOUNTER — Encounter: Payer: Self-pay | Admitting: Oncology

## 2023-03-30 ENCOUNTER — Other Ambulatory Visit: Payer: Self-pay

## 2023-03-30 ENCOUNTER — Other Ambulatory Visit: Payer: Self-pay | Admitting: Family Medicine

## 2023-03-30 ENCOUNTER — Encounter: Payer: Self-pay | Admitting: Oncology

## 2023-03-30 DIAGNOSIS — K2100 Gastro-esophageal reflux disease with esophagitis, without bleeding: Secondary | ICD-10-CM

## 2023-03-31 ENCOUNTER — Other Ambulatory Visit: Payer: Self-pay

## 2023-03-31 MED ORDER — FAMOTIDINE 40 MG PO TABS
40.0000 mg | ORAL_TABLET | Freq: Every evening | ORAL | 0 refills | Status: DC
  Filled 2023-03-31 – 2023-04-27 (×2): qty 30, 30d supply, fill #0
  Filled 2023-05-28 – 2023-06-12 (×3): qty 30, 30d supply, fill #1
  Filled 2023-07-15: qty 30, 30d supply, fill #2

## 2023-03-31 NOTE — Telephone Encounter (Signed)
Requested Prescriptions  Pending Prescriptions Disp Refills   famotidine (PEPCID) 40 MG tablet 90 tablet 0    Sig: Take 1 tablet (40 mg total) by mouth every evening.     Gastroenterology:  H2 Antagonists Passed - 03/30/2023  9:13 AM      Passed - Valid encounter within last 12 months    Recent Outpatient Visits           2 months ago Encounter for annual physical exam   [facility] [place], Shea Stakes, NP   8 months ago Chronic cough   [facility] [place], Shea Stakes, NP   1 year ago Neuropathy   [facility] Claiborne Rigg, NP   1 year ago Hospital discharge follow-up   [facility] Claiborne Rigg, NP   1 year ago Polyneuropathy associated with underlying disease [facility])   [place] [facility] Claiborne Rigg, NP       Future Appointments             In 1 month Wyline Mood, MD [facility]

## 2023-04-06 ENCOUNTER — Other Ambulatory Visit: Payer: Self-pay

## 2023-04-27 ENCOUNTER — Encounter: Payer: Self-pay | Admitting: Oncology

## 2023-04-27 ENCOUNTER — Other Ambulatory Visit: Payer: Self-pay

## 2023-04-29 ENCOUNTER — Other Ambulatory Visit: Payer: Self-pay

## 2023-05-18 ENCOUNTER — Ambulatory Visit (INDEPENDENT_AMBULATORY_CARE_PROVIDER_SITE_OTHER): Payer: 59 | Admitting: Gastroenterology

## 2023-05-18 ENCOUNTER — Encounter: Payer: Self-pay | Admitting: Gastroenterology

## 2023-05-18 ENCOUNTER — Encounter: Payer: Self-pay | Admitting: Oncology

## 2023-05-18 VITALS — BP 108/75 | HR 89 | Temp 98.0°F | Ht 68.0 in | Wt 126.1 lb

## 2023-05-18 DIAGNOSIS — Z8719 Personal history of other diseases of the digestive system: Secondary | ICD-10-CM | POA: Diagnosis not present

## 2023-05-18 DIAGNOSIS — Z8619 Personal history of other infectious and parasitic diseases: Secondary | ICD-10-CM

## 2023-05-18 NOTE — Patient Instructions (Addendum)
Please schedule follow up with Sandra Pearson in 3 months.  MRCP is schedule on 05/19/2023 @  4 pm  Central scheduling phone number [phone]

## 2023-05-18 NOTE — Progress Notes (Unsigned)
Wyline Mood MD, MRCP(U.K) [address]  [address]  [place], [place] [address]  Main: [phone]  Fax: [phone]   Primary Care Physician: Claiborne Rigg, NP  Primary Gastroenterologist:  Dr. Wyline Mood   Chief Complaint  Patient presents with   Pancreatitis    History, last seen by Dr Maximino Greenland    HPI: Sandra Pearson is a 37 y.o. female   Summary of history : She was last seen at our office back in 2022 by Dr. Maximino Greenland with a prior history of iron deficiency anemia, necrotizing pancreatitis.  Also has undergone cholecystectomy in September 2022.  Dr[facility] Nanda Quinton at [facility] had seen her and mentioned that she had gallstone pancreatitis     Most recent labs show a normal CBC, and ferritin.   Interval history   08/14/2021-05/18/2023  Presently referred for history of pancreatitis referred for history of pancreatitis.  Hemoglobin in January 14 2411.8 g creatinine 0.84 LFTs normal.  05/03/2022 CT abdomen pelvis with contrast showed possible wall off necrosis complicating pancreatitis smaller in size  She states she was asked to come and see [place] because she has not seen a GI doctor for a few years.  Denies any change in bowel habits denies any diarrhea she is a smoker, trying to quit.  She is quit all alcohol use some abdominal discomfort in the upper part of the abdomen feels like a gallbladder.  That she had in the past but not really affected by food intake not relieved with a bowel movement denies any NSAID use colicky in nature nonradiating.  No family history of pancreatitis Current Outpatient Medications  Medication Sig Dispense Refill   acetaminophen (TYLENOL) 500 MG tablet Take 2 tablets (1,000 mg total) by mouth every 6 (six) hours as needed for mild pain.     famotidine (PEPCID) 40 MG tablet Take 1 tablet (40 mg total) by mouth every evening. 90 tablet 0   folic acid (FOLVITE) 1 MG tablet Take 1 tablet (1 mg total) by mouth daily. 90 tablet 3   omeprazole  (PRILOSEC) 40 MG capsule Take 1 capsule (40 mg total) by mouth daily. 90 capsule 1   ondansetron (ZOFRAN-ODT) 4 MG disintegrating tablet Dissolve 1 tablet (4 mg total) by mouth once every 8 (eight) hours as needed for nausea or vomiting. 20 tablet 2   pregabalin (LYRICA) 100 MG capsule Take 1 capsule (100 mg total) by mouth 2 (two) times daily. 60 capsule 5   thiamine 100 MG tablet Take 1 tablet (100 mg total) by mouth daily. 30 tablet 0   ferrous sulfate 325 (65 FE) MG tablet Take 1 tablet (325 mg total) by mouth daily. 90 tablet 1   No current facility-administered medications for this visit.    Allergies as of 05/18/2023   (No Known Allergies)     ROS:  General: Negative for anorexia, weight loss, fever, chills, fatigue, weakness. ENT: Negative for hoarseness, difficulty swallowing , nasal congestion. CV: Negative for chest pain, angina, palpitations, dyspnea on exertion, peripheral edema.  Respiratory: Negative for dyspnea at rest, dyspnea on exertion, cough, sputum, wheezing.  GI: See history of present illness. GU:  Negative for dysuria, hematuria, urinary incontinence, urinary frequency, nocturnal urination.  Endo: Negative for unusual weight change.    Physical Examination:   BP 108/75   Pulse 89   Temp 98 F (36.7 C) (Oral)   Ht 5\' 8"  (1.727 m)   Wt 126 lb 2 oz (57.2 kg)   BMI  19.18 kg/m   General: Appears very thin Eyes: No icterus. Conjunctivae pink. Mouth: Oropharyngeal mucosa moist and pink , no lesions erythema or exudate. Lungs: Clear to auscultation bilaterally. Non-labored. Heart: Regular rate and rhythm, no murmurs rubs or gallops.  Abdomen: Bowel sounds are normal, nontender, nondistended, no hepatosplenomegaly or masses, no abdominal bruits or hernia , no rebound or guarding.   Extremities: No lower extremity edema. No clubbing or deformities. Neuro: Alert and oriented x 3.  Grossly intact. Skin: Warm and dry, no jaundice.   Psych: Alert and  cooperative, normal mood and affect.   Imaging Studies: No results found.  Assessment and Plan:   Sandra Pearson is a 37 y.o. y/o female here to see [place] after 2 years and she had an episode of necrotizing gallstone pancreatitis.  Complicated by a walled off pancreatic necrosis. History of H. pylori on biopsies.  Status posttreatment with triple therapy.  Presently having some mild upper abdominal discomfort will rule out H. pylori and obtain MRI of the pancreas to rule out pancreatic divisum as she has some nonspecific symptoms at this point of time otherwise I do not believe she requires regular follow-up with GI if both MRI and H. pylori testing are negative.     Dr Wyline Mood  MD,MRCP [facility]) Follow up in 3 months with Celso Amy

## 2023-05-19 ENCOUNTER — Ambulatory Visit: Payer: 59

## 2023-05-19 ENCOUNTER — Telehealth: Payer: Self-pay

## 2023-05-19 LAB — H. PYLORI BREATH TEST: H pylori Breath Test: NEGATIVE

## 2023-05-19 NOTE — Telephone Encounter (Signed)
Tried to do authorization on [employer] and it said Based on the information you entered, this Member's record was not located. Please validate the member information, searching by their 11 digit member ID and try again. Called [employer]  at [phone]. They could not find member also so they had to build the case and will have to send to Jari Favre to verify to member insurance information to make sure she She is active. They have pending the case.  Case number is [id number]

## 2023-05-19 NOTE — Telephone Encounter (Signed)
Tried to call patient because patient secondary insurance [employer] is needing a PA for the patient MRI. Tried to call patient but voicemail is not set up

## 2023-05-20 NOTE — Telephone Encounter (Signed)
[id number] Kates ANNA [id number] Approved 05/18/2023 [id number] MRI ABDOMEN W & W/O CONTRAST [facility] -- [facility] REGIONAL - Change 11/14/2023

## 2023-05-22 ENCOUNTER — Other Ambulatory Visit: Payer: Self-pay | Admitting: Gastroenterology

## 2023-05-22 ENCOUNTER — Ambulatory Visit
Admission: RE | Admit: 2023-05-22 | Discharge: 2023-05-22 | Disposition: A | Discharge status: Home / Self Care | Payer: 59 | Source: Ambulatory Visit | Attending: Gastroenterology | Admitting: Gastroenterology

## 2023-05-22 DIAGNOSIS — K861 Other chronic pancreatitis: Secondary | ICD-10-CM | POA: Diagnosis not present

## 2023-05-22 DIAGNOSIS — Z8719 Personal history of other diseases of the digestive system: Secondary | ICD-10-CM

## 2023-05-22 DIAGNOSIS — D1803 Hemangioma of intra-abdominal structures: Secondary | ICD-10-CM | POA: Diagnosis not present

## 2023-05-22 DIAGNOSIS — K8591 Acute pancreatitis with uninfected necrosis, unspecified: Secondary | ICD-10-CM | POA: Diagnosis not present

## 2023-05-22 MED ORDER — GADOBUTROL 1 MMOL/ML IV SOLN
5.0000 mL | Freq: Once | INTRAVENOUS | Status: AC | PRN
  Administered 2023-05-22: 5 mL via INTRAVENOUS

## 2023-05-25 ENCOUNTER — Telehealth: Payer: Self-pay

## 2023-05-25 NOTE — Telephone Encounter (Signed)
Called patient and couldn't leave her a voicemail as it was full. I will send her a message through MyChart as she is active.

## 2023-05-25 NOTE — Telephone Encounter (Signed)
-----   Message from Wyline Mood sent at 05/25/2023  8:04 AM EDT ----- Stable cyst and chronic pancreatitis seen - nothing that we need to do anything about right now. Can we check if she has diarrhea , does she smoke or drink alcohol presently ?

## 2023-05-25 NOTE — Progress Notes (Signed)
Stable cyst and chronic pancreatitis seen - nothing that we need to do anything about right now. Can we check if she has diarrhea , does she smoke or drink alcohol presently ?

## 2023-05-28 ENCOUNTER — Other Ambulatory Visit: Payer: Self-pay | Admitting: Neurology

## 2023-05-28 ENCOUNTER — Other Ambulatory Visit: Payer: Self-pay

## 2023-05-28 ENCOUNTER — Other Ambulatory Visit: Payer: Self-pay | Admitting: Nurse Practitioner

## 2023-05-28 DIAGNOSIS — K2100 Gastro-esophageal reflux disease with esophagitis, without bleeding: Secondary | ICD-10-CM

## 2023-05-28 DIAGNOSIS — G621 Alcoholic polyneuropathy: Secondary | ICD-10-CM

## 2023-05-28 MED ORDER — OMEPRAZOLE 40 MG PO CPDR
40.0000 mg | DELAYED_RELEASE_CAPSULE | Freq: Every day | ORAL | 0 refills | Status: AC
Start: 2023-05-28 — End: ?
  Filled 2023-05-28 – 2023-06-12 (×3): qty 30, 30d supply, fill #0

## 2023-05-29 ENCOUNTER — Other Ambulatory Visit: Payer: Self-pay

## 2023-05-29 ENCOUNTER — Encounter: Payer: Self-pay | Admitting: Oncology

## 2023-06-02 ENCOUNTER — Encounter: Payer: Self-pay | Admitting: Pharmacist

## 2023-06-02 ENCOUNTER — Other Ambulatory Visit: Payer: Self-pay

## 2023-06-03 ENCOUNTER — Other Ambulatory Visit: Payer: Self-pay

## 2023-06-05 ENCOUNTER — Other Ambulatory Visit: Payer: Self-pay

## 2023-06-12 ENCOUNTER — Other Ambulatory Visit: Payer: Self-pay | Admitting: Neurology

## 2023-06-12 ENCOUNTER — Encounter: Payer: Self-pay | Admitting: Oncology

## 2023-06-12 ENCOUNTER — Telehealth: Payer: Self-pay | Admitting: Neurology

## 2023-06-12 ENCOUNTER — Other Ambulatory Visit: Payer: Self-pay

## 2023-06-12 DIAGNOSIS — G621 Alcoholic polyneuropathy: Secondary | ICD-10-CM

## 2023-06-12 MED ORDER — PREGABALIN 100 MG PO CAPS
100.0000 mg | ORAL_CAPSULE | Freq: Two times a day (BID) | ORAL | 5 refills | Status: DC
Start: 2023-06-12 — End: 2023-12-07
  Filled 2023-06-12: qty 60, 30d supply, fill #0
  Filled 2023-07-15: qty 60, 30d supply, fill #1
  Filled 2023-08-19: qty 60, 30d supply, fill #2
  Filled 2023-09-16 (×2): qty 60, 30d supply, fill #3
  Filled 2023-10-19: qty 60, 30d supply, fill #4
  Filled 2023-11-18: qty 60, 30d supply, fill #5

## 2023-06-12 NOTE — Telephone Encounter (Signed)
I left message to call office unsure of message and whom called.

## 2023-06-12 NOTE — Telephone Encounter (Signed)
Called patient she has not seen the message when sent in April. She did not answer when calling left message

## 2023-06-12 NOTE — Telephone Encounter (Signed)
Caller received a message on MyChart to call Dr Allena Katz, she is returning the call.

## 2023-06-15 ENCOUNTER — Other Ambulatory Visit: Payer: Self-pay

## 2023-06-15 ENCOUNTER — Encounter: Payer: Self-pay | Admitting: Oncology

## 2023-07-03 ENCOUNTER — Other Ambulatory Visit: Payer: Self-pay | Admitting: Nurse Practitioner

## 2023-07-03 DIAGNOSIS — K2100 Gastro-esophageal reflux disease with esophagitis, without bleeding: Secondary | ICD-10-CM

## 2023-07-03 MED ORDER — ONDANSETRON 4 MG PO TBDP
4.0000 mg | ORAL_TABLET | Freq: Three times a day (TID) | ORAL | 0 refills | Status: AC | PRN
  Filled 2023-07-03: qty 20, 7d supply, fill #0

## 2023-07-04 ENCOUNTER — Encounter: Payer: Self-pay | Admitting: Oncology

## 2023-07-04 ENCOUNTER — Other Ambulatory Visit: Payer: Self-pay

## 2023-07-09 ENCOUNTER — Other Ambulatory Visit: Payer: Self-pay

## 2023-07-15 ENCOUNTER — Encounter: Payer: Self-pay | Admitting: Oncology

## 2023-07-15 ENCOUNTER — Other Ambulatory Visit: Payer: Self-pay

## 2023-07-20 ENCOUNTER — Other Ambulatory Visit: Payer: Self-pay

## 2023-08-19 ENCOUNTER — Other Ambulatory Visit: Payer: Self-pay

## 2023-08-19 ENCOUNTER — Encounter: Payer: Self-pay | Admitting: Oncology

## 2023-08-19 ENCOUNTER — Other Ambulatory Visit: Payer: Self-pay | Admitting: Family Medicine

## 2023-08-19 DIAGNOSIS — K2100 Gastro-esophageal reflux disease with esophagitis, without bleeding: Secondary | ICD-10-CM

## 2023-08-20 NOTE — Telephone Encounter (Signed)
Requested medication (s) are due for refill today: yes  Requested medication (s) are on the active medication list: yes  Last refill:  07/03/23  Future visit scheduled: no  Notes to clinic:  Unable to refill per protocol, cannot delegate.      Requested Prescriptions  Pending Prescriptions Disp Refills   ondansetron (ZOFRAN-ODT) 4 MG disintegrating tablet 20 tablet 0    Sig: Dissolve 1 tablet (4 mg total) by mouth once every 8 (eight) hours as needed for nausea or vomiting.     Not Delegated - Gastroenterology: Antiemetics - ondansetron Failed - 08/19/2023 10:39 AM      Failed - This refill cannot be delegated      Failed - Valid encounter within last 6 months    Recent Outpatient Visits           7 months ago Encounter for annual physical exam   [place] [facility] [place], Shea Stakes, NP   1 year ago Chronic cough   [place] [facility] [place], Shea Stakes, NP   1 year ago Neuropathy   [facility] Claiborne Rigg, NP   1 year ago Hospital discharge follow-up   [facility] [place], [place] W, NP   2 years ago Polyneuropathy associated with underlying disease [facility])   [place] [facility] [place], Shea Stakes, NP              Passed - AST in normal range and within 360 days    AST  Date Value Ref Range Status  01/19/2023 20 0 - 40 IU/L Final         Passed - ALT in normal range and within 360 days    ALT  Date Value Ref Range Status  01/19/2023 14 0 - 32 IU/L Final

## 2023-08-25 ENCOUNTER — Other Ambulatory Visit: Payer: Self-pay

## 2023-08-27 ENCOUNTER — Other Ambulatory Visit: Payer: Self-pay

## 2023-09-16 ENCOUNTER — Other Ambulatory Visit: Payer: Self-pay | Admitting: Family Medicine

## 2023-09-16 ENCOUNTER — Other Ambulatory Visit: Payer: Self-pay

## 2023-09-16 ENCOUNTER — Other Ambulatory Visit: Payer: Self-pay | Admitting: Nurse Practitioner

## 2023-09-16 ENCOUNTER — Encounter: Payer: Self-pay | Admitting: Oncology

## 2023-09-16 DIAGNOSIS — K2100 Gastro-esophageal reflux disease with esophagitis, without bleeding: Secondary | ICD-10-CM

## 2023-09-16 MED ORDER — FAMOTIDINE 40 MG PO TABS
40.0000 mg | ORAL_TABLET | Freq: Every evening | ORAL | 0 refills | Status: AC
  Filled 2023-09-16: qty 30, 30d supply, fill #0

## 2023-09-17 ENCOUNTER — Other Ambulatory Visit: Payer: Self-pay

## 2023-10-19 ENCOUNTER — Other Ambulatory Visit: Payer: Self-pay

## 2023-10-19 ENCOUNTER — Encounter: Payer: Self-pay | Admitting: Oncology

## 2023-11-18 ENCOUNTER — Encounter: Payer: Self-pay | Admitting: Oncology

## 2023-11-18 ENCOUNTER — Other Ambulatory Visit: Payer: Self-pay | Admitting: Family Medicine

## 2023-11-18 ENCOUNTER — Other Ambulatory Visit: Payer: Self-pay

## 2023-11-18 DIAGNOSIS — K2100 Gastro-esophageal reflux disease with esophagitis, without bleeding: Secondary | ICD-10-CM

## 2023-11-18 NOTE — Telephone Encounter (Signed)
Requested medication (s) are due for refill today: Yes  Requested medication (s) are on the active medication list: Yes  Last refill:  05/28/23 (Prilosec), 11/24 (Pepcid)  Future visit scheduled: No  Notes to clinic:  Unable to refill per protocol, courtesy refill already given, routing for provider approval.      Requested Prescriptions  Pending Prescriptions Disp Refills   omeprazole (PRILOSEC) 40 MG capsule 30 capsule 0    Sig: Take 1 capsule (40 mg total) by mouth daily.     Gastroenterology: Proton Pump Inhibitors Passed - 11/18/2023 10:51 AM      Passed - Valid encounter within last 12 months    Recent Outpatient Visits           10 months ago Encounter for annual physical exam   [place] [facility] [place]. [facility] Claiborne Rigg, NP   1 year ago Chronic cough   [facility] [facility] [place] [facility] [place]. [facility] Claiborne Rigg, NP   1 year ago Neuropathy   [place] [facility] [place] [facility] Of [place]. [facility] Claiborne Rigg, NP   2 years ago Hospital discharge follow-up   [facility] [place] - A Dept Of [place]. [facility] Claiborne Rigg, NP   2 years ago Polyneuropathy associated with underlying disease [facility])   [place] [facility] - A Dept Of [place]. [facility] [place], [place] W, NP               famotidine (PEPCID) 40 MG tablet 30 tablet 0    Sig: Take 1 tablet (40 mg total) by mouth every evening.     Gastroenterology:  H2 Antagonists Passed - 11/18/2023 10:51 AM      Passed - Valid encounter within last 12 months    Recent Outpatient Visits           10 months ago Encounter for annual physical exam   [place] [facility] [place] [facility] Of [place]. [facility] Claiborne Rigg, NP   1 year ago Chronic cough   [place] [facility] [place] - [facility] Dept Of [place]. [facility] Claiborne Rigg, NP    1 year ago Neuropathy   [place] [facility] [place] - A Dept Of [place]. [facility] Claiborne Rigg, NP   2 years ago Hospital discharge follow-up   [facility] [place] - A Dept Of [place]. [facility] Claiborne Rigg, NP   2 years ago Polyneuropathy associated with underlying disease [facility])   [place] [facility] - A Dept Of . [facility] Claiborne Rigg, [place]

## 2023-11-26 ENCOUNTER — Other Ambulatory Visit: Payer: Self-pay

## 2023-12-07 ENCOUNTER — Ambulatory Visit (INDEPENDENT_AMBULATORY_CARE_PROVIDER_SITE_OTHER): Payer: 59 | Admitting: Neurology

## 2023-12-07 ENCOUNTER — Encounter: Payer: Self-pay | Admitting: Oncology

## 2023-12-07 ENCOUNTER — Encounter: Payer: Self-pay | Admitting: Neurology

## 2023-12-07 ENCOUNTER — Other Ambulatory Visit: Payer: Self-pay

## 2023-12-07 DIAGNOSIS — G621 Alcoholic polyneuropathy: Secondary | ICD-10-CM | POA: Diagnosis not present

## 2023-12-07 MED ORDER — PREGABALIN 100 MG PO CAPS
100.0000 mg | ORAL_CAPSULE | Freq: Two times a day (BID) | ORAL | 5 refills | Status: DC
  Filled 2023-12-07 – 2023-12-21 (×2): qty 60, 30d supply, fill #0
  Filled 2024-01-18 (×2): qty 60, 30d supply, fill #1
  Filled 2024-02-19 (×2): qty 60, 30d supply, fill #2
  Filled 2024-03-18: qty 60, 30d supply, fill #3
  Filled 2024-04-18: qty 60, 30d supply, fill #4
  Filled 2024-05-18: qty 60, 30d supply, fill #5

## 2023-12-07 NOTE — Progress Notes (Signed)
 Follow-up Visit   Date: 12/07/2023    Sandra Pearson MRN: [id number] DOB: Mar 01, 1986    Sandra Pearson is a 38 y.o. right-handed Caucasian female with GERD, alcohol dependence, tobacco use, and iron  deficient anemia returning to the clinic for follow-up of alcohol-induced neuropathy.  The patient was accompanied to the clinic by self.   IMPRESSION/PLAN: Alcohol-induced neuropathy with distal painful parethesias.  Ataxia has significant improved since abstaining from alcohol (05/2022).  Continue Lyrica  100mg  twice daily Continue home exercises Praised her for making healthy lifestyle choices  Return to clinic in 1 year  --------------------------------------------- History of present illness: Starting around 2020, she began having shooting pain, stinging/stabbing, and numbness involving bilateral feet, which has gradually extended into the lower legs up to the knees. Symptoms are constant with episodic severe pain.  No significant numbness/tingling of the hands. She endorses difficulty with walking long distance and has poor balance.  She walks with a cane as needed, no falls. No radicular back pain.  She has difficulty manipulating pedals when driving.  She has tried gabapentin  600mg  BID and now on Lyrica  75mg  twice daily.  She has not noticed that either medication has significant alleviated her pain.  She previously drank liquor heavily in her 2s, currently drinks 5-6 beers throughout the week.     UPDATE 12/03/2022:  She is here for 6 month follow-up.  Since her last visit, she had NCS/EMG of the legs which showed severe axonal neuropathy affecting the legs.  She has completely stopped alcohol consumption since her last visit and has been very happy with how she is feeling now.  She denies the urge for alcohol.  Her legs do not hurt as much as they used to, she feels better, and balance has improved.  She did not complete PT due to co-pay being $77 each visit.   She has not had any falls.   UPDATE 12/07/2023:  She is here for 1 year follow-up visit.  Overall, she has been doing great. She remains sober and also quit smoking.  Her neuropathy is not as severe as before and pain is well-controlled on Lyrica  100mg  twice daily. Most of her numbness/pain is below the ankles.  Balance is also doing much better.  No falls.  She is very pleased with her health progress.  She is also back in school for C[employer].  She tells me that alcoholism runs in her family and her father unfortunately passed away from complications related to this over the weekend.  Medications:  Current Outpatient Medications on File Prior to Visit  Medication Sig Dispense Refill   acetaminophen  (TYLENOL ) 500 MG tablet Take 2 tablets (1,000 mg total) by mouth every 6 (six) hours as needed for mild pain.     famotidine  (PEPCID ) 40 MG tablet Take 1 tablet (40 mg total) by mouth every evening. 30 tablet 0   ferrous sulfate  325 (65 FE) MG tablet Take 1 tablet (325 mg total) by mouth daily. 90 tablet 1   folic acid  (FOLVITE ) 1 MG tablet Take 1 tablet (1 mg total) by mouth daily. 90 tablet 3   omeprazole  (PRILOSEC) 40 MG capsule Take 1 capsule (40 mg total) by mouth daily. 30 capsule 0   ondansetron  (ZOFRAN -ODT) 4 MG disintegrating tablet Dissolve 1 tablet (4 mg total) by mouth once every 8 (eight) hours as needed for nausea or vomiting. (Patient taking differently: Take 4 mg by mouth every 8 (eight) hours as needed for nausea or vomiting. prn) 20  tablet 0   pregabalin  (LYRICA ) 100 MG capsule Take 1 capsule (100 mg total) by mouth 2 (two) times daily. 60 capsule 5   thiamine  100 MG tablet Take 1 tablet (100 mg total) by mouth daily. 30 tablet 0   No current facility-administered medications on file prior to visit.    Allergies: No Known Allergies  Vital Signs:  BP 124/84   Pulse (!) 108   Ht 5\' 8"  (1.727 m)   Wt 124 lb (56.2 kg)   SpO2 100%   BMI 18.85 kg/m     Neurological Exam: MENTAL  STATUS including orientation to time, place, person, recent and remote memory, attention span and concentration, language, and fund of knowledge is normal.  Speech is not dysarthric.  CRANIAL NERVES:   Pupils equal round and reactive to light.  Normal conjugate, extra-ocular eye movements in all directions of gaze.  No ptosis .  Face is symmetric.   MOTOR:  Motor strength is 5/5 in all extremities, including distally in the toes (improved)  No atrophy, fasciculations or abnormal movements.  No pronator drift.  Tone is normal.    MSRs:                                           Right        Left brachioradialis 2+  2+  biceps 2+  2+  triceps 2+  2+  patellar 1+  1+  ankle jerk 0  0   SENSORY:  Vibration reduced below the ankles and at the great toe, but she is able to perceive.   Temperature intact.    COORDINATION/GAIT:  Normal finger-to- nose-finger.  Intact rapid alternating movements bilaterally.  Gait narrow based and stable. Stressed and tandem gait intact.  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    Thank you for allowing me to participate in patient's care.  If I can answer any additional questions, I would be pleased to do so.    Sincerely,    Gennie Eisinger K. Lydia Sams, DO

## 2023-12-07 NOTE — Patient Instructions (Signed)
 It was great to see you and I am so very proud of your life style change!

## 2023-12-09 ENCOUNTER — Other Ambulatory Visit: Payer: Self-pay

## 2023-12-21 ENCOUNTER — Encounter: Payer: Self-pay | Admitting: Oncology

## 2023-12-21 ENCOUNTER — Other Ambulatory Visit: Payer: Self-pay

## 2024-01-18 ENCOUNTER — Encounter: Payer: Self-pay | Admitting: Oncology

## 2024-01-18 ENCOUNTER — Other Ambulatory Visit: Payer: Self-pay

## 2024-01-18 ENCOUNTER — Other Ambulatory Visit: Payer: Self-pay | Admitting: Family Medicine

## 2024-01-18 DIAGNOSIS — K2100 Gastro-esophageal reflux disease with esophagitis, without bleeding: Secondary | ICD-10-CM

## 2024-01-19 NOTE — Telephone Encounter (Signed)
 Called pt and advised was doctors office on phone. Not sure if she heard, was asleep or impaired. Sounded off.  Called pt back and unable to LM on VM - VM full. Last refill 09/16/23 #30 Last OV 01/19/23. Requested Prescriptions  Pending Prescriptions Disp Refills   famotidine (PEPCID) 40 MG tablet 30 tablet 0    Sig: Take 1 tablet (40 mg total) by mouth every evening.     Gastroenterology:  H2 Antagonists Failed - 01/19/2024  3:58 PM      Failed - Valid encounter within last 12 months    Recent Outpatient Visits           1 year ago Encounter for annual physical exam   [place] [facility] [place]. [facility] Claiborne Rigg, NP   1 year ago Chronic cough   [place] [facility] [place] - A Dept Of [place]. [facility] Claiborne Rigg, NP   2 years ago Neuropathy   [place] [facility] [place] - A Dept Of [place]. [facility] Claiborne Rigg, NP   2 years ago Hospital discharge follow-up   [facility] [place] - A Dept Of [place]. [facility] Claiborne Rigg, NP   2 years ago Polyneuropathy associated with underlying disease [facility])   [place] [facility] - A Dept Of [place]. [facility] Claiborne Rigg, [place]

## 2024-01-19 NOTE — Telephone Encounter (Signed)
 Patient needs an appointment to receive refills, courtesy refill already given. Please contact patient to schedule an appointment.

## 2024-01-28 ENCOUNTER — Other Ambulatory Visit: Payer: Self-pay

## 2024-02-19 ENCOUNTER — Other Ambulatory Visit: Payer: Self-pay

## 2024-02-19 ENCOUNTER — Encounter: Payer: Self-pay | Admitting: Oncology

## 2024-03-17 ENCOUNTER — Encounter: Payer: Self-pay | Admitting: Oncology

## 2024-03-18 ENCOUNTER — Other Ambulatory Visit: Payer: Self-pay

## 2024-04-18 ENCOUNTER — Other Ambulatory Visit: Payer: Self-pay

## 2024-04-18 ENCOUNTER — Encounter: Payer: Self-pay | Admitting: Oncology

## 2024-04-19 ENCOUNTER — Encounter: Payer: Self-pay | Admitting: Oncology

## 2024-05-18 ENCOUNTER — Other Ambulatory Visit: Payer: Self-pay

## 2024-05-25 ENCOUNTER — Other Ambulatory Visit: Payer: Self-pay

## 2024-05-26 ENCOUNTER — Other Ambulatory Visit: Payer: Self-pay

## 2024-05-26 ENCOUNTER — Other Ambulatory Visit: Payer: Self-pay | Admitting: Neurology

## 2024-05-26 ENCOUNTER — Encounter: Payer: Self-pay | Admitting: Oncology

## 2024-05-26 DIAGNOSIS — G621 Alcoholic polyneuropathy: Secondary | ICD-10-CM

## 2024-05-27 MED ORDER — PREGABALIN 100 MG PO CAPS
100.0000 mg | ORAL_CAPSULE | Freq: Two times a day (BID) | ORAL | 5 refills | Status: AC
Start: 2024-05-27 — End: ?

## 2024-05-31 ENCOUNTER — Encounter: Payer: Self-pay | Admitting: Neurology

## 2024-07-01 ENCOUNTER — Other Ambulatory Visit: Payer: Self-pay | Admitting: Neurology

## 2024-07-01 ENCOUNTER — Other Ambulatory Visit: Payer: Self-pay

## 2024-07-01 DIAGNOSIS — G621 Alcoholic polyneuropathy: Secondary | ICD-10-CM

## 2024-07-01 MED ORDER — PREGABALIN 100 MG PO CAPS
100.0000 mg | ORAL_CAPSULE | Freq: Two times a day (BID) | ORAL | 5 refills | Status: AC
  Filled 2024-07-01: qty 60, 30d supply, fill #0
  Filled 2024-08-01: qty 60, 30d supply, fill #1
  Filled 2024-08-29: qty 60, 30d supply, fill #2
  Filled 2024-09-26: qty 60, 30d supply, fill #3
  Filled 2024-10-25: qty 60, 30d supply, fill #4
  Filled 2024-11-25: qty 60, 30d supply, fill #5

## 2024-07-04 ENCOUNTER — Other Ambulatory Visit: Payer: Self-pay

## 2024-07-04 ENCOUNTER — Encounter: Payer: Self-pay | Admitting: Oncology

## 2024-07-29 ENCOUNTER — Other Ambulatory Visit: Payer: Self-pay

## 2024-07-29 ENCOUNTER — Other Ambulatory Visit: Payer: Self-pay | Admitting: Family Medicine

## 2024-07-29 ENCOUNTER — Other Ambulatory Visit (HOSPITAL_BASED_OUTPATIENT_CLINIC_OR_DEPARTMENT_OTHER): Payer: Self-pay

## 2024-07-29 DIAGNOSIS — F1020 Alcohol dependence, uncomplicated: Secondary | ICD-10-CM

## 2024-08-01 ENCOUNTER — Other Ambulatory Visit: Payer: Self-pay

## 2024-08-01 ENCOUNTER — Encounter: Payer: Self-pay | Admitting: Oncology

## 2024-08-25 ENCOUNTER — Other Ambulatory Visit: Payer: Self-pay

## 2024-08-29 ENCOUNTER — Other Ambulatory Visit: Payer: Self-pay

## 2024-08-29 ENCOUNTER — Other Ambulatory Visit (HOSPITAL_COMMUNITY): Payer: Self-pay

## 2024-09-26 ENCOUNTER — Other Ambulatory Visit: Payer: Self-pay

## 2024-09-26 ENCOUNTER — Encounter: Payer: Self-pay | Admitting: Oncology

## 2024-10-25 ENCOUNTER — Encounter: Payer: Self-pay | Admitting: Oncology

## 2024-10-25 ENCOUNTER — Other Ambulatory Visit: Payer: Self-pay

## 2024-11-22 ENCOUNTER — Encounter: Payer: Self-pay | Admitting: Neurology

## 2024-11-22 ENCOUNTER — Ambulatory Visit: Payer: Self-pay | Admitting: Neurology

## 2024-11-25 ENCOUNTER — Other Ambulatory Visit: Payer: Self-pay

## 2024-11-25 ENCOUNTER — Encounter: Payer: Self-pay | Admitting: Oncology

## 2024-11-29 ENCOUNTER — Encounter: Payer: Self-pay | Admitting: Oncology

## 2024-11-29 ENCOUNTER — Other Ambulatory Visit: Payer: Self-pay

## 2024-12-06 ENCOUNTER — Ambulatory Visit: Payer: 59 | Admitting: Neurology
